# Patient Record
Sex: Female | Born: 1935 | Race: White | Hispanic: No | Smoking: Never smoker
Health system: Southern US, Community
[De-identification: ages and names within clinical notes are randomized; demographics above are authoritative.]

## PROBLEM LIST (undated history)

## (undated) DIAGNOSIS — J4489 Other specified chronic obstructive pulmonary disease: Secondary | ICD-10-CM

## (undated) DIAGNOSIS — I7300 Raynaud's syndrome without gangrene: Secondary | ICD-10-CM

## (undated) DIAGNOSIS — R4182 Altered mental status, unspecified: Secondary | ICD-10-CM

## (undated) DIAGNOSIS — D649 Anemia, unspecified: Secondary | ICD-10-CM

## (undated) DIAGNOSIS — M25559 Pain in unspecified hip: Secondary | ICD-10-CM

## (undated) DIAGNOSIS — M1990 Unspecified osteoarthritis, unspecified site: Secondary | ICD-10-CM

## (undated) DIAGNOSIS — W19XXXA Unspecified fall, initial encounter: Secondary | ICD-10-CM

## (undated) DIAGNOSIS — Z96649 Presence of unspecified artificial hip joint: Secondary | ICD-10-CM

## (undated) DIAGNOSIS — R739 Hyperglycemia, unspecified: Secondary | ICD-10-CM

## (undated) DIAGNOSIS — J438 Other emphysema: Secondary | ICD-10-CM

## (undated) DIAGNOSIS — I10 Essential (primary) hypertension: Secondary | ICD-10-CM

## (undated) DIAGNOSIS — K922 Gastrointestinal hemorrhage, unspecified: Secondary | ICD-10-CM

## (undated) DIAGNOSIS — M719 Bursopathy, unspecified: Secondary | ICD-10-CM

## (undated) DIAGNOSIS — R141 Gas pain: Secondary | ICD-10-CM

## (undated) DIAGNOSIS — K5900 Constipation, unspecified: Secondary | ICD-10-CM

## (undated) DIAGNOSIS — F0280 Dementia in other diseases classified elsewhere without behavioral disturbance: Secondary | ICD-10-CM

## (undated) DIAGNOSIS — I719 Aortic aneurysm of unspecified site, without rupture: Secondary | ICD-10-CM

## (undated) DIAGNOSIS — J189 Pneumonia, unspecified organism: Secondary | ICD-10-CM

## (undated) DIAGNOSIS — J449 Chronic obstructive pulmonary disease, unspecified: Secondary | ICD-10-CM

## (undated) DIAGNOSIS — I959 Hypotension, unspecified: Secondary | ICD-10-CM

## (undated) DIAGNOSIS — S52609A Unspecified fracture of lower end of unspecified ulna, initial encounter for closed fracture: Secondary | ICD-10-CM

## (undated) DIAGNOSIS — E559 Vitamin D deficiency, unspecified: Secondary | ICD-10-CM

## (undated) DIAGNOSIS — R0602 Shortness of breath: Secondary | ICD-10-CM

## (undated) DIAGNOSIS — R350 Frequency of micturition: Secondary | ICD-10-CM

## (undated) DIAGNOSIS — C189 Malignant neoplasm of colon, unspecified: Secondary | ICD-10-CM

## (undated) DIAGNOSIS — M2550 Pain in unspecified joint: Secondary | ICD-10-CM

## (undated) DIAGNOSIS — G309 Alzheimer's disease, unspecified: Secondary | ICD-10-CM

## (undated) DIAGNOSIS — L89609 Pressure ulcer of unspecified heel, unspecified stage: Secondary | ICD-10-CM

## (undated) DIAGNOSIS — R142 Eructation: Secondary | ICD-10-CM

## (undated) DIAGNOSIS — M67919 Unspecified disorder of synovium and tendon, unspecified shoulder: Secondary | ICD-10-CM

## (undated) DIAGNOSIS — R143 Flatulence: Secondary | ICD-10-CM

## (undated) DIAGNOSIS — K2270 Barrett's esophagus without dysplasia: Secondary | ICD-10-CM

## (undated) DIAGNOSIS — F411 Generalized anxiety disorder: Secondary | ICD-10-CM

## (undated) DIAGNOSIS — M810 Age-related osteoporosis without current pathological fracture: Secondary | ICD-10-CM

## (undated) HISTORY — DX: Hyperglycemia, unspecified: R73.9

## (undated) HISTORY — DX: Anemia, unspecified: D64.9

## (undated) HISTORY — DX: Pain in unspecified hip: M25.559

## (undated) HISTORY — DX: Presence of unspecified artificial hip joint: Z96.649

## (undated) HISTORY — DX: Altered mental status, unspecified: R41.82

## (undated) HISTORY — DX: Aortic aneurysm of unspecified site, without rupture: I71.9

## (undated) HISTORY — DX: Gastrointestinal hemorrhage, unspecified: K92.2

## (undated) HISTORY — DX: Constipation, unspecified: K59.00

## (undated) HISTORY — PX: DILATION AND CURETTAGE OF UTERUS: SHX78

## (undated) HISTORY — DX: Unspecified fracture of lower end of unspecified ulna, initial encounter for closed fracture: S52.609A

## (undated) HISTORY — DX: Vitamin D deficiency, unspecified: E55.9

## (undated) HISTORY — DX: Raynaud's syndrome without gangrene: I73.00

## (undated) HISTORY — DX: Other emphysema: J43.8

## (undated) HISTORY — DX: Bursopathy, unspecified: M71.9

## (undated) HISTORY — DX: Pneumonia, unspecified organism: J18.9

## (undated) HISTORY — DX: Pain in unspecified joint: M25.50

## (undated) HISTORY — DX: Unspecified fall, initial encounter: W19.XXXA

## (undated) HISTORY — DX: Unspecified disorder of synovium and tendon, unspecified shoulder: M67.919

## (undated) HISTORY — DX: Hypotension, unspecified: I95.9

---

## 1998-07-28 DIAGNOSIS — I7300 Raynaud's syndrome without gangrene: Secondary | ICD-10-CM

## 1998-07-28 HISTORY — DX: Raynaud's syndrome without gangrene: I73.00

## 1999-07-29 DIAGNOSIS — J438 Other emphysema: Secondary | ICD-10-CM

## 1999-07-29 HISTORY — DX: Other emphysema: J43.8

## 1999-12-29 HISTORY — PX: COLON SURGERY: SHX602

## 2002-12-28 HISTORY — PX: CATARACT EXTRACTION W/ INTRAOCULAR LENS  IMPLANT, BILATERAL: SHX1307

## 2005-07-28 DIAGNOSIS — M810 Age-related osteoporosis without current pathological fracture: Secondary | ICD-10-CM

## 2005-07-28 DIAGNOSIS — M1990 Unspecified osteoarthritis, unspecified site: Secondary | ICD-10-CM

## 2005-07-28 DIAGNOSIS — I719 Aortic aneurysm of unspecified site, without rupture: Secondary | ICD-10-CM

## 2005-07-28 HISTORY — DX: Aortic aneurysm of unspecified site, without rupture: I71.9

## 2005-07-28 HISTORY — DX: Age-related osteoporosis without current pathological fracture: M81.0

## 2005-07-28 HISTORY — DX: Unspecified osteoarthritis, unspecified site: M19.90

## 2005-10-14 ENCOUNTER — Ambulatory Visit (HOSPITAL_COMMUNITY): Admission: RE | Admit: 2005-10-14 | Discharge: 2005-10-14 | Payer: Self-pay | Admitting: Internal Medicine

## 2008-10-09 DIAGNOSIS — K5900 Constipation, unspecified: Secondary | ICD-10-CM

## 2008-10-09 HISTORY — DX: Constipation, unspecified: K59.00

## 2008-12-28 HISTORY — PX: TOTAL HIP ARTHROPLASTY: SHX124

## 2009-03-23 DIAGNOSIS — Z96649 Presence of unspecified artificial hip joint: Secondary | ICD-10-CM

## 2009-03-23 HISTORY — DX: Presence of unspecified artificial hip joint: Z96.649

## 2009-04-01 ENCOUNTER — Inpatient Hospital Stay (HOSPITAL_COMMUNITY): Admission: EM | Admit: 2009-04-01 | Discharge: 2009-04-05 | Payer: Self-pay | Admitting: Emergency Medicine

## 2009-04-01 ENCOUNTER — Ambulatory Visit: Payer: Self-pay | Admitting: *Deleted

## 2009-04-02 ENCOUNTER — Encounter (INDEPENDENT_AMBULATORY_CARE_PROVIDER_SITE_OTHER): Payer: Self-pay | Admitting: Specialist

## 2009-04-05 DIAGNOSIS — D649 Anemia, unspecified: Secondary | ICD-10-CM

## 2009-04-05 HISTORY — DX: Anemia, unspecified: D64.9

## 2009-04-12 DIAGNOSIS — M25559 Pain in unspecified hip: Secondary | ICD-10-CM

## 2009-04-12 HISTORY — DX: Pain in unspecified hip: M25.559

## 2009-04-13 ENCOUNTER — Inpatient Hospital Stay (HOSPITAL_COMMUNITY): Admission: EM | Admit: 2009-04-13 | Discharge: 2009-04-17 | Payer: Self-pay | Admitting: Emergency Medicine

## 2009-04-14 ENCOUNTER — Ambulatory Visit: Payer: Self-pay | Admitting: Vascular Surgery

## 2009-04-15 ENCOUNTER — Encounter (INDEPENDENT_AMBULATORY_CARE_PROVIDER_SITE_OTHER): Payer: Self-pay | Admitting: Internal Medicine

## 2009-06-21 DIAGNOSIS — M67919 Unspecified disorder of synovium and tendon, unspecified shoulder: Secondary | ICD-10-CM

## 2009-06-21 HISTORY — DX: Unspecified disorder of synovium and tendon, unspecified shoulder: M67.919

## 2010-04-11 DIAGNOSIS — R4182 Altered mental status, unspecified: Secondary | ICD-10-CM

## 2010-04-11 HISTORY — DX: Altered mental status, unspecified: R41.82

## 2010-05-07 DIAGNOSIS — E559 Vitamin D deficiency, unspecified: Secondary | ICD-10-CM

## 2010-05-07 HISTORY — DX: Vitamin D deficiency, unspecified: E55.9

## 2011-03-29 DIAGNOSIS — S52609A Unspecified fracture of lower end of unspecified ulna, initial encounter for closed fracture: Secondary | ICD-10-CM

## 2011-03-29 HISTORY — DX: Unspecified fracture of lower end of unspecified ulna, initial encounter for closed fracture: S52.609A

## 2011-04-08 LAB — URINALYSIS, ROUTINE W REFLEX MICROSCOPIC
Bilirubin Urine: NEGATIVE
Bilirubin Urine: NEGATIVE
Bilirubin Urine: NEGATIVE
Glucose, UA: NEGATIVE mg/dL
Glucose, UA: NEGATIVE mg/dL
Glucose, UA: NEGATIVE mg/dL
Hgb urine dipstick: NEGATIVE
Hgb urine dipstick: NEGATIVE
Ketones, ur: NEGATIVE mg/dL
Nitrite: NEGATIVE
Protein, ur: NEGATIVE mg/dL
Specific Gravity, Urine: 1.013 (ref 1.005–1.030)
Specific Gravity, Urine: 1.016 (ref 1.005–1.030)
pH: 6.5 (ref 5.0–8.0)
pH: 7 (ref 5.0–8.0)

## 2011-04-08 LAB — COMPREHENSIVE METABOLIC PANEL
ALT: 41 U/L — ABNORMAL HIGH (ref 0–35)
AST: 23 U/L (ref 0–37)
AST: 28 U/L (ref 0–37)
Albumin: 2.3 g/dL — ABNORMAL LOW (ref 3.5–5.2)
Albumin: 2.8 g/dL — ABNORMAL LOW (ref 3.5–5.2)
BUN: 24 mg/dL — ABNORMAL HIGH (ref 6–23)
CO2: 24 mEq/L (ref 19–32)
Calcium: 8.5 mg/dL (ref 8.4–10.5)
Calcium: 9.5 mg/dL (ref 8.4–10.5)
Calcium: 9.1 mg/dL (ref 8.4–10.5)
Chloride: 103 mEq/L (ref 96–112)
Chloride: 105 mEq/L (ref 96–112)
Creatinine, Ser: 0.58 mg/dL (ref 0.4–1.2)
Creatinine, Ser: 0.58 mg/dL (ref 0.4–1.2)
Creatinine, Ser: 0.86 mg/dL (ref 0.4–1.2)
GFR calc Af Amer: >60 mL/min (ref >60)
GFR calc Af Amer: >60 mL/min (ref >60)
GFR calc Af Amer: >60 mL/min (ref >60)
GFR calc non Af Amer: >60 mL/min (ref >60)
Sodium: 133 mEq/L — ABNORMAL LOW (ref 135–145)
Total Bilirubin: 0.6 mg/dL (ref 0.3–1.2)
Total Protein: 6.8 g/dL (ref 6.0–8.3)

## 2011-04-08 LAB — CULTURE, BLOOD (ROUTINE X 2): Culture: NO GROWTH

## 2011-04-08 LAB — CBC
HCT: 32.5 % — ABNORMAL LOW (ref 36.0–46.0)
HCT: 22.3 % — ABNORMAL LOW (ref 36.0–46.0)
HCT: 35.1 % — ABNORMAL LOW (ref 36.0–46.0)
Hemoglobin: 10.9 g/dL — ABNORMAL LOW (ref 12.0–15.0)
Hemoglobin: 11.1 g/dL — ABNORMAL LOW (ref 12.0–15.0)
Hemoglobin: 7.6 g/dL — CL (ref 12.0–15.0)
Hemoglobin: 9.1 g/dL — ABNORMAL LOW (ref 12.0–15.0)
MCHC: 33.8 g/dL (ref 30.0–36.0)
MCHC: 34 g/dL (ref 30.0–36.0)
MCHC: 34.1 g/dL (ref 30.0–36.0)
MCHC: 33.2 g/dL (ref 30.0–36.0)
MCHC: 34 g/dL (ref 30.0–36.0)
MCHC: 34.5 g/dL (ref 30.0–36.0)
MCV: 93.9 fL (ref 78.0–100.0)
MCV: 94.3 fL (ref 78.0–100.0)
MCV: 92.7 fL (ref 78.0–100.0)
MCV: 94.1 fL (ref 78.0–100.0)
Platelets: 464 10*3/uL — ABNORMAL HIGH (ref 150–400)
Platelets: 470 10*3/uL — ABNORMAL HIGH (ref 150–400)
Platelets: 580 10*3/uL — ABNORMAL HIGH (ref 150–400)
Platelets: 260 10*3/uL (ref 150–400)
RBC: 3.19 MIL/uL — ABNORMAL LOW (ref 3.87–5.11)
RBC: 3.49 MIL/uL — ABNORMAL LOW (ref 3.87–5.11)
RBC: 3.98 MIL/uL (ref 3.87–5.11)
RBC: 2.38 MIL/uL — ABNORMAL LOW (ref 3.87–5.11)
RBC: 2.82 MIL/uL — ABNORMAL LOW (ref 3.87–5.11)
RBC: 3.25 MIL/uL — ABNORMAL LOW (ref 3.87–5.11)
RDW: 14.2 % (ref 11.5–15.5)
RDW: 14.5 % (ref 11.5–15.5)
RDW: 14.9 % (ref 11.5–15.5)
RDW: 13.2 % (ref 11.5–15.5)
RDW: 13.5 % (ref 11.5–15.5)
WBC: 12.3 10*3/uL — ABNORMAL HIGH (ref 4.0–10.5)
WBC: 7.1 10*3/uL (ref 4.0–10.5)
WBC: 8.2 10*3/uL (ref 4.0–10.5)
WBC: 7.7 10*3/uL (ref 4.0–10.5)
WBC: 9.8 10*3/uL (ref 4.0–10.5)

## 2011-04-08 LAB — CROSSMATCH
ABO/RH(D): O POS
Antibody Screen: NEGATIVE
Antibody Screen: NEGATIVE

## 2011-04-08 LAB — BASIC METABOLIC PANEL
BUN: 13 mg/dL (ref 6–23)
BUN: 18 mg/dL (ref 6–23)
CO2: 26 mEq/L (ref 19–32)
CO2: 24 mEq/L (ref 19–32)
CO2: 24 mEq/L (ref 19–32)
CO2: 25 mEq/L (ref 19–32)
Calcium: 8.7 mg/dL (ref 8.4–10.5)
Calcium: 8.7 mg/dL (ref 8.4–10.5)
Calcium: 8.1 mg/dL — ABNORMAL LOW (ref 8.4–10.5)
Calcium: 8.7 mg/dL (ref 8.4–10.5)
Chloride: 103 mEq/L (ref 96–112)
Chloride: 106 mEq/L (ref 96–112)
Chloride: 108 mEq/L (ref 96–112)
Creatinine, Ser: 0.48 mg/dL (ref 0.4–1.2)
Creatinine, Ser: 0.48 mg/dL (ref 0.4–1.2)
Creatinine, Ser: 0.74 mg/dL (ref 0.4–1.2)
Creatinine, Ser: 0.76 mg/dL (ref 0.4–1.2)
GFR calc Af Amer: >60 mL/min (ref >60)
GFR calc Af Amer: >60 mL/min (ref >60)
GFR calc Af Amer: >60 mL/min (ref >60)
GFR calc Af Amer: >60 mL/min (ref >60)
GFR calc Af Amer: >60 mL/min (ref >60)
GFR calc Af Amer: >60 mL/min (ref >60)
GFR calc non Af Amer: >60 mL/min (ref >60)
GFR calc non Af Amer: >60 mL/min (ref >60)
GFR calc non Af Amer: >60 mL/min (ref >60)
GFR calc non Af Amer: >60 mL/min (ref >60)
Glucose, Bld: 106 mg/dL — ABNORMAL HIGH (ref 70–99)
Glucose, Bld: 133 mg/dL — ABNORMAL HIGH (ref 70–99)
Glucose, Bld: 129 mg/dL — ABNORMAL HIGH (ref 70–99)
Glucose, Bld: 141 mg/dL — ABNORMAL HIGH (ref 70–99)
Potassium: 3 mEq/L — ABNORMAL LOW (ref 3.5–5.1)
Potassium: 3.1 mEq/L — ABNORMAL LOW (ref 3.5–5.1)
Potassium: 3.8 mEq/L (ref 3.5–5.1)
Potassium: 3.7 mEq/L (ref 3.5–5.1)
Sodium: 133 mEq/L — ABNORMAL LOW (ref 135–145)
Sodium: 135 mEq/L (ref 135–145)
Sodium: 137 mEq/L (ref 135–145)
Sodium: 132 mEq/L — ABNORMAL LOW (ref 135–145)
Sodium: 134 mEq/L — ABNORMAL LOW (ref 135–145)

## 2011-04-08 LAB — DIFFERENTIAL
Basophils Absolute: 0 10*3/uL (ref 0.0–0.1)
Eosinophils Absolute: 0 10*3/uL (ref 0.0–0.7)
Eosinophils Relative: 0 % (ref 0–5)
Lymphocytes Relative: 9 % — ABNORMAL LOW (ref 12–46)
Lymphocytes Relative: 14 % (ref 12–46)
Lymphs Abs: 1.1 10*3/uL (ref 0.7–4.0)
Lymphs Abs: 1.4 10*3/uL (ref 0.7–4.0)
Monocytes Absolute: 0.6 10*3/uL (ref 0.1–1.0)
Monocytes Relative: 5 % (ref 3–12)
Neutro Abs: 8.1 10*3/uL — ABNORMAL HIGH (ref 1.7–7.7)

## 2011-04-08 LAB — PROTIME-INR
INR: 1.1 (ref 0.00–1.49)
INR: 1 (ref 0.00–1.49)
Prothrombin Time: 14.3 seconds (ref 11.6–15.2)
Prothrombin Time: 13.7 seconds (ref 11.6–15.2)

## 2011-04-08 LAB — URINE CULTURE: Special Requests: NEGATIVE

## 2011-04-08 LAB — APTT
aPTT: 37 seconds (ref 24–37)
aPTT: 30 seconds (ref 24–37)

## 2011-04-08 LAB — LIPASE, BLOOD: Lipase: 28 U/L (ref 11–59)

## 2011-04-08 LAB — CK TOTAL AND CKMB (NOT AT ARMC): Relative Index: INVALID (ref 0.0–2.5)

## 2011-04-08 LAB — URINE MICROSCOPIC-ADD ON

## 2011-04-08 LAB — IRON AND TIBC
Iron: 32 ug/dL — ABNORMAL LOW (ref 42–135)
TIBC: 202 ug/dL — ABNORMAL LOW (ref 250–470)

## 2011-04-08 LAB — GLUCOSE, CAPILLARY: Glucose-Capillary: 121 mg/dL — ABNORMAL HIGH (ref 70–99)

## 2011-04-08 LAB — SAMPLE TO BLOOD BANK

## 2011-04-08 LAB — RETICULOCYTES: Retic Count, Absolute: 100.4 10*3/uL (ref 19.0–186.0)

## 2011-05-12 NOTE — Op Note (Signed)
NAMEJUNELL, [name]                  ACCOUNT NO.:  [id number]   MEDICAL RECORD NO.:  [id number]          PATIENT TYPE:  INP   LOCATION:  1612                         FACILITY:  [facility]   PHYSICIAN:  Madlyn Frankel. Charlann Boxer, M.D.  DATE OF BIRTH:  [birth year]   DATE OF PROCEDURE:  04/02/2009  DATE OF DISCHARGE:                               OPERATIVE REPORT   PREOPERATIVE DIAGNOSIS:  Left peritrochanteric/subtrochanteric femur  fracture with displacement.   POSTOPERATIVE DIAGNOSIS:  Left peritrochanteric/subtrochanteric femur  fracture with displacement.   PROCEDURE:  Open reduction with internal fixation, left proximal femur  fracture utilizing DePuy trochanteric nail, 320 mm x 11 mm with  130  degree lag screw measuring 105 mm and a distal interlock.   SURGEON:  Madlyn Frankel. Charlann Boxer, M.D.   ASSISTANT:  Yetta Glassman. Mann, PA   ANESTHESIA:  General   ESTIMATED BLOOD LOSS:  Approximately 150 mL.   DRAINS:  None.   COMPLICATIONS:  None.   SPECIMEN:  None.   INDICATIONS FOR PROCEDURE:  [name] is a 77 year old female admitted  ti the hospital the day before after falling.  Radiographs revealed a  proximal femur fracture that had an intertrochanteric extension but  extended well below the subtrochanteric region.  There was anterior  angulation and displacement with posterior displacement of the shaft.   She was initially admitted to Dr. Paula Libra.  I was asked to consult  for definitive management.  The fracture pattern was reviewed with the  family.  The risks and benefits discussed about the procedure itself for  increased risks of perioperative mortality, morbidity due to her  dementia, etc.  Consent was obtained for the benefit of fracture  healing.   PROCEDURE IN DETAIL:  The patient was brought to operative theater.  Once adequate anesthesia, preoperative antibiotics, Ancef administered,  the patient was positioned supine on the fracture table.  The right  lower extremity was flexed  and abducted out of the way with bony  prominences padded to protect the peroneal nerve.   The left foot was placed in a traction shoe. At this point traction with  internal rotation was applied.  Fluoroscopy was brought into the field  and anterior-posterior dimension of the fracture was reduced into  anatomic position.  However, laterally there was still the posterior  displacement.  However, the length of  the fracture appeared to be  anatomic.  At this point the left lateral thigh from the iliac crest to  below the knee was prepped and draped using shower curtain augmented by  Ioban distally to allow for perfect circle technique.   Landmarks were identified.  I basically made an incision that started  below the fracture pattern distally, proximal to the trochanter, to  allow for entry of the nail.  I dissected down to the iliotibial band  and incised this.  I then elevated the vastus lateralis distally over  the fracture site, identifying the fracture.  I was able to reduce the  fracture into anatomic position.  I passed the bone hook around the  fracture site and  passed a 16 gauge wire which I cinched down, tightened  to reduce the fracture into anatomic position and maintain reduction and  internal fixation.   Once this was done and confirmed radiographically in both the AP and  lateral dimension, I attended to the nail end in the hip.  I chose to  use a long trochanteric nail for fixation as well as passage of the  subtrochanteric portion of the nail.   A guidewire was then further inserted through the tip of the trochanter.  I then drilled to open the proximal femur and then passed a ball-tip  guidewire.  I started with a 9 mm reamer and reamed up to a 12.5 mm  reamer.  I then measured the depth and chose a 320 mm nail.  I passed  this left trochanter entry nail by hand to its correct location.  There  was no difficulty in passage.  Once it was oriented into its correct   plane I passed a guidewire into the center of the head in the AP and  lateral planes.  I chose a 105-mm lag screw, drilled and tapped this  area and then passed this lag screw without difficulty.  Once this was  done I removed the jig proximally and into a perfect surgical technique  and distal screw.  Fluoroscopy was brought to a lateral position and  covered appropriately.  I then used a drill and 44 mm distal interlock.  Final radiographs were obtained in the AP and lateral planes confirming  reduction maintenance.  The 16 gauge wire was tightened down, cut and  bent into its appropriate position, with no soft tissue impingement.  We  had irrigated the wounds throughout the case and again at this point.  I  then reapproximated the iliotibial band over the vastus lateralis using  #1 Vicryl.  The remainder of the wounds were closed with 2-0 Vicryl and  staples on the skin including the distal wound.  The patient was then  brought to recovery in stable condition.  She tolerated to the procedure  well.  Madlyn Frankel. Charlann Boxer, M.D.  Electronically Signed     MDO/MEDQ  D:  04/02/2009  T:  04/03/2009  Job:  [id number]

## 2011-05-12 NOTE — Discharge Summary (Signed)
NAMEANDREANA, KLINGERMAN                  ACCOUNT NO.:  [id number]   MEDICAL RECORD NO.:  [id number]          PATIENT TYPE:  INP   LOCATION:  1304                         FACILITY:  [facility]   PHYSICIAN:  Eduard Clos, MDDATE OF BIRTH:  18-Apr-1917   DATE OF ADMISSION:  04/12/2009  DATE OF DISCHARGE:                               DISCHARGE SUMMARY   PRIMARY CARE PHYSICIAN:  Dr. Murray Hodgkins in [facility].   COURSE IN THE HOSPITAL:  A 77 year old female who had a recent ORIF,  history of Alzheimer's dementia, hypertension, hypothyroidism, was  brought into the ER after the patient was found to have intractable  nausea and vomiting.  The patient was also found to be initially  hypotensive.  The patient was started on IV fluids, placed initially  n.p.o.  Acute abdominal series did not show any acute findings, and CAT  scan was also done, which did not show acute findings, except some  thickening of the distal esophageus.  The patient has a known history of  Barrett's esophagus.  Followup barium swallow did not show any mass or  any strictures.  The patient also had concerning a Doppler of the lower  extremities done, which shows negative for any DVT.  Followup was done  as the patient was complaining of some pain.  There was no defect found,  except for the healing fracture and ORIF.  I did review x-ray findings  with radiologist.   As the patient's nausea and vomiting subsided, the patient was able to  tolerate diet.  The patient was transferred to medical floor and at this  time the patient is able to tolerate 100% of her diet.  The patient will  be transferred back as soon as tomorrow morning stable.  At the time of  this dictation, the patient is hemodynamically stable.  Also discussed  with the patient's son, the patient will need followup with her GI  doctor for further workup on her Barrett's esophagus.   PROCEDURES DONE DURING THE STAY:  1. Abdominal series on April 06, 2009 showed no acute cardiopulmonary      process.  Gas in the colon without evidence of obstruction.  2. CT abdomen and pelvis with contrast on April 12, 2009 showed no      acute findings, distal esophageal wall thickening suspicious for      esophagitis or mass. Consider nonemergent endoscopy for further      evaluation.  No acute findings.  3. X-ray of the pelvis on April 14, 2009, hardware appears      appropriately placed.  Fracture fragments having realigned.  4. X-ray of the hip, left side, April 14, 2009, showed interval      placement of left femoral long-stem gamma nail with distal aspect      not visualized.  Improved alignment compared to preoperative exam.      Small step off in the proximal left femoral shaft, also seen with      original fracture.  5. Barium swallow on April 15, 2009 showed a limited exam.  No  evidence of high-grade stricture or obstructing mass.   PERTINENT LABS:  The patient had a hemoglobin of 11.1, hematocrit 32.5  at discharge.  Creatinine was 0.4.  Lactic acid was 0.9 and lipase was  28.  LFTs were normal.   FINAL DIAGNOSES:  1. Intractable nausea and vomiting.  2. Hypotension from dehydration.  3. Dehydration.  4. Dementia.  5. Hypertension.  6. Recent left-sided open reduction internal fixation.   MEDICATIONS AT DISCHARGE:  1. Lovenox 40 mg subcutaneous daily for 6 more days.  2. Enteric-coated aspirin 325 mg daily for 4 weeks followed by 81 mg      p.o. to start from May 04, 2009.  3. Iron 325 mg 2 times daily.  4. Centrum Silver 1 tablet p.o. daily.  5. Altace 2.5 mg daily.  6. Vitamin C 500 mg daily.  7. Namenda 10 mg p.o. twice daily.  8. Aricept 10 mg p.o. at bedtime.  9. Robaxin 5 mg p.o. q.6 hours p.r.n. pain.  10.Colace 100 mg p.o. twice daily as needed.  11.Miralax 17 g p.o. daily as needed for constipation.  12.Tylenol 650 mg p.o. q.6 p.r.n. back pain.  13.Claritin 10 mg p.o. daily.  14.Phazyme 180 mg 3 times daily as  needed.   PLAN:  The patient is returned to skilled nursing facility tomorrow if  stable.  To follow with her primary care physician, Dr. Chilton Si within a  week's time.  Recheck BMET and CBC.  The patient will need further  workup on Barrett's esophagus as scheduled per PCP through GI doctor.  The patient is to have cardiac healthy diet.  Activity as tolerated with  fall precautions.      Eduard Clos, MD  Electronically Signed     ANK/MEDQ  D:  04/16/2009  T:  04/16/2009  Job:  [id number]   cc:   Lenon Curt. Chilton Si, M.D.  Fax: [phone]

## 2011-05-12 NOTE — H&P (Signed)
[name], [name]                  ACCOUNT NO.:  [id number]   MEDICAL RECORD NO.:  [id number]          PATIENT TYPE:  EMS   LOCATION:  ED                           FACILITY:  [facility]   PHYSICIAN:  Jene Every, M.D.    DATE OF BIRTH:  September 30, 1917   DATE OF ADMISSION:  04/01/2009  DATE OF DISCHARGE:                              HISTORY & PHYSICAL   CHIEF COMPLAINT:  Left hip pain.   HISTORY:  This is a 77 year old female with a history of Alzheimer's who  apparently had slipped on a wet floor today onto her left hip.  She has  had a generalized rapid decline in her overall condition with a history  of Alzheimer's according to her son.  No loss of consciousness, blurred  vision.   She denies chest pain, shortness of breath, fevers, or chills.   PAST MEDICAL HISTORY:  Otherwise significant including COPD,  hypertension, Alzheimer's, anxiety, aortic aneurysm, aortic valve  regurgitation, colon CA, emphysema, pneumonia, Raynaud's disease,  degenerative joint disease, Barrett's esophagus.   FAMILY HISTORY:  Noncontributory.   SOCIAL HISTORY:  Lives at the [facility].   PRIMARY CARE Diedra Sinor:  Dr. Neva Seat.   MEDICATIONS:  Actonel, aspirin, centrum, Altace, vitamins, Amitiza,  claritin, Phazyme, Tylenol.   EXAM:  CONSTITUTIONAL:  Elderly.  Mild distress.  Mood and affect are  anxious.  The patient inappropriate responses.  HEENT:  Within normal limits.  COR:  Regular rate and rhythm.  PULMONARY:  Clear to auscultation.  ABDOMEN:  Soft and nontender.  PELVIS:  Stable.  EXTREMITIES:  Left lower extremity shortened and externally rotated.  1+  dorsalis pedis and posterior tibial pulses.  Dorsiflexion and plantar  flexion.  Compartments were soft and nontender to palpation.  No joint  line tenderness is noted.  Pain with attempted rotation of her hip.  This is on the left.  Right is negative.   Radiographs demonstrate an intertrochanteric/subtrochanteric fracture,  osteoarthritis  of the hip is noted.   LABS:  UA negative.  Sodium 135, potassium 4.4, creatinine 0.86, BUN 26.  LFTs are within normal limits.  Slightly low albumin at 3.1.  White  count is 9.8, hematocrit 35.1, and hemoglobin is 11.6.  White count is  9.8.  Platelet count is 260,000.   EKG is normal sinus rhythm.  Chest x-ray with no active disease.   IMPRESSION:  Acute peritrochanteric left hip fracture with 2 multiple  comorbidities including aortic aneurysm, aortic valve regurgitation,  Alzheimer's, hypertension, chronic obstructive pulmonary disease.   PLAN:  1. Will require intramedullary nailing of the left hip.  We discussed      risks and benefits with the family and her including bleeding,      infection, DVT, PE, malunion, nonunion, need for revision, et      Karie Soda before undergoing hip replacement.  2. Preoperative clearance.  Seen by Dr. Patty Sermons as on call.      Additional information concerning aortic aneurysm.  3. Will proceed with IM nailing.      Jene Every, M.D.  Electronically Signed  JB/MEDQ  D:  04/01/2009  T:  04/01/2009  Job:  [id number]

## 2011-05-12 NOTE — Consult Note (Signed)
[name], [name]                  ACCOUNT NO.:  [id number]   MEDICAL RECORD NO.:  [id number]          PATIENT TYPE:  INP   LOCATION:  0105                         FACILITY:  [facility]   PHYSICIAN:  Audery Amel, MD    DATE OF BIRTH:  1917-05-10   DATE OF CONSULTATION:  DATE OF DISCHARGE:                                 CONSULTATION   REASON FOR CONSULTATION:  Preoperative evaluation.   HISTORY OF PRESENT ILLNESS:  [name] is a 77 year old white female  with a past medical history notable for reduced systolic function, mild  aortic insufficiency, and Alzheimer's disease who presents to the  emergency department after falling earlier this evening.  The patient is  significantly demented and unable to supply history.  However, she was  accompanied by her family members.  They state that the patient  slipped and fell in her bathroom earlier today after stepping on some  water.  Her family members deny that the patient experienced any chest  pain, shortness of breath, dyspnea on exertion, palpitations, presyncope  or syncope.  Based on the details of the presentation, it would seem  that the patient experienced a mechanical fall.  They do state that the  patient's gait has become somewhat unstable over the last 6-7 months.  Previously, the patient was very physically active and engaged in water  aerobics.  However, she has not at been able to do this in the last 6-7  months.  They do state that the patient is able to ambulate on flat  surfaces without any functional limitations other than her gait  instability.  The patient has no personal history of coronary artery  disease nor is there a history of coronary artery disease in her family.  The patient has not been experiencing any symptoms which are concerning  for angina or an acute coronary syndrome.  Her only complaint currently  is that of left hip pain.   PAST MEDICAL HISTORY:  1. Reduced systolic function with estimated EF of  40-45% by      echocardiogram in April 2008.  2. Mild aortic insufficiency.  3. Hypertension.  4. Alzheimer's disease.  5. Osteoarthritis.  6. Questionable history of abdominal aortic aneurysm.  7. History of hypothyroidism.  8. History of suspected Raynaud's disease.  9. Status post partial colon resection in 2001 for cancer.  10.Barrett's esophagus status post multiple esophageal dilatations.   SOCIAL HISTORY:  To place the patient currently lives in a skilled  nursing facility.  There is no history of alcohol, tobacco or illicit  substances.   FAMILY HISTORY:  In speaking with her family members, there is no  general history of coronary artery disease in her family, and all but  one of her brothers and sisters are currently alive and well.   REVIEW OF SYSTEMS:  As per HPI as supplied by her family members.  Unable to directly elicit a review of systems from the patient due to  her dementia.   PHYSICAL EXAMINATION:  VITAL SIGNS:  Blood pressure is 147/82, heart  rate is 55,  O2 sats are 96% on 2 liters nasal cannula.  GENERAL:  The patient is a very elderly, frail-appearing white female in  no acute distress.  She is alert and awake and oriented to her person  but not place and time.  NECK:  Supple, full range of motion, and mild JVD.  There is no palpable  thyromegaly, or lymphadenopathy.  Carotid upstrokes are equal and  symmetric bilaterally.  No audible bruits.  CHEST:  Clear to auscultation bilaterally anteriorly.  CARDIOVASCULAR:  Normal S1-S2 with no audible murmurs, rubs or gallops.  PMI is nonpalpable.  ABDOMEN:  Obese, soft, nontender and positive bowel sounds and no past  splenomegaly.  EXTREMITIES:  Reveal no clubbing, cyanosis or edema.  She has 2+ distal  pulses symmetric bilaterally.  NEUROLOGIC:  The patient does have significant dementia, but her exam  appears nonfocal.   DATA REVIEWED:  EKG by my interpretation reveals sinus bradycardia with  first-degree  AV block and nonspecific ST changes which are likely  artifact due to patient movement.   CBC:  White count 9.8, hematocrit 35.1, platelet count 260.  Sodium 135,  potassium 4.4, chloride 105, CO2 is 24, BUN is 24, creatinine 0.86,  glucose 125, T bili 0.6, AST is 23, ALT 16, alk-phos 72, calcium 9.1,  INR 1.0.   IMPRESSION:  1. Left hip intertrochanteric fracture.  2. Preoperative evaluation and risk ratification  3. Reduced systolic heart function, estimated EF 40 45%.  4. Mild aortic insufficiency.  5. Hypertension.  6. Alzheimer's dementia.   RECOMMENDATIONS:  Based on the history of presentation provided by Ms.  Ramirez's family members, it would appear that the patient has sustained a  mechanical fall.  It does not appear that there was a cardiovascular  etiology.  She denies any symptoms which are concerning for angina, and  there is no evidence to suggest an acute coronary syndrome or an  arrhythmic etiology.  The patient has no prior history of coronary  artery disease.  She does, however, have a history of reduced systolic  function with mild aortic insufficiency by echocardiogram in 2008.  At  this point, I would recommend checking a 2-D transthoracic  echocardiogram to assess her left ventricular function and structure.  Given that the patient is currently not receiving a beta blocker  chronically, I do not advocate initiating beta blockade acutely prior to  proceeding to the OR.  It certainly would be reasonable to continue her  81 mg aspirin.  However, in the absence of coronary artery disease, this  is likely not necessary as well.  Based on history, the patient would  seem to have a reasonable functional capacity, and there are no signs or  symptoms to warrant an ischemia evaluation.  Pending the outcome of her  echocardiogram, she may proceed to the OR without further evaluation.  We appreciate the opportunity to participate in the care of Ms.  Ramirez,  and we will  follow along with you throughout the rest of her hospital  course.  Please feel free to contact us if there are any additional  questions.      Audery Amel, MD  Electronically Signed     SHG/MEDQ  D:  04/01/2009  T:  04/02/2009  Job:  [id number]

## 2011-05-12 NOTE — Discharge Summary (Signed)
Jamie, Ramirez                  ACCOUNT NO.:  [id number]   MEDICAL RECORD NO.:  [id number]          PATIENT TYPE:  INP   LOCATION:  1612                         FACILITY:  [facility]   PHYSICIAN:  Madlyn Frankel. Charlann Boxer, M.D.  DATE OF BIRTH:  Aug 16, 1917   DATE OF ADMISSION:  04/01/2009  DATE OF DISCHARGE:                               DISCHARGE SUMMARY   ADMITTING DIAGNOSES:  1. Osteoporosis.  2. Chronic obstructive pulmonary disease.  3. Hypertension.  4. Alzheimer's.  5. Anxiety.  6. Aortic aneurysm.  7. Aortic valve regurgitation.  8. Colon cancer.  9. Emphysema.  10.Raynaud's disease.  11.Degenerative joint disease.  12.Barrett's esophagus.   DISCHARGE DIAGNOSES:  1. Osteoporosis.  2. Chronic obstructive pulmonary disease.  3. Hypertension.  4. Alzheimer's.  5. Anxiety.  6. Aortic aneurysm.  7. Aortic valve regurgitation.  8. Colon cancer.  9. Emphysema.  10.Raynaud's disease.  11.Degenerative joint disease.  12.Barrett's esophagus.  13.Acute blood loss anemia.   HISTORY OF PRESENT ILLNESS:  A 77 year old female with a history of  Alzheimer's slipped on a wet floor onto her left hip, had immediate  pain, inability to bear weight and was brought to the emergency  department and x-rays revealed a left hip fracture.  She was admitted  for definitive surgical management.   CONSULTS:  Cardiology for medical clearance.   PROCEDURE:  Open reduction, internal fixation of a left subtrochanteric  femur fracture by surgeon, Dr. Durene Romans and assistant, Dwyane Luo PA-  C.   LABORATORY DATA:  CBC - white blood cells 6.8, hemoglobin 7.6,  hematocrit 22.3, platelets 182.  She was transfused 2 units prior to  discharge.  Metabolic panel; sodium 132, potassium 3.7, BUN 9,  creatinine 0.64, glucose 129.   RADIOLOGY:  Left femur two-view showed satisfactory appearance, status  post ORIF of left hip fracture.   Cardiology:  EKG on April 01, 2009, showed sinus tachycardia, T-wave  abnormality, otherwise normal ECG.   HOSPITAL COURSE:  The patient admitted through the emergency department  to the orthopedic service.  She was cleared by cardiology for surgery.  She underwent surgery on April 02, 2009, by Dr. Charlann Boxer.  She had family  members present throughout the course of stay as well as sitters.  She  remained afebrile.  Hemodynamically, she did have acute blood loss  anemia and was transfused a couple units.  Otherwise, her electrolytes  remained stable.  Her dressing was changed.  No significant drainage  from the wound.  She remained neurovascularly intact of her left lower  extremity.  She was partial weightbearing status.  She is high fall  risk.  By April 05, 2009, she was stable, afebrile.  Dressing was  changed.  She was neurovascularly intact of her left lower extremity and  ready for discharge to skilled nurse facility rehab.   DISCHARGE DISPOSITION:  Discharged to skilled nurse facility rehab in  stable and improved condition.   DISCHARGE PHYSICAL THERAPY:  She is partial weightbearing 50% with use  of rolling walker.  She is high fall risk.   DISCHARGE DIET:  Heart-healthy.   DISCHARGE WOUND CARE:  Keep dry.   DISCHARGE MEDICATIONS:  1. Lovenox 40 mg subcu q.24 x10 days.  2. Start enteric-coated aspirin 325 mg 1 p.o. daily x4 weeks.  3. Start aspirin 81 mg p.o. daily after completion of the full-      strength aspirin.  4. Robaxin 500 mg 1 p.o. q.6 p.r.n. muscle spasm or pain.  5. Iron 325 mg 1 p.o. t.i.d. x2 weeks.  6. Colace 100 mg 1 p.o. b.i.d. p.r.n.  7. MiraLax 17 grams p.o. daily p.r.n.  8. Norco 5/325 one p.o. q.6 p.r.n. moderate to severe pain.  9. Tylenol 650 mg 1 p.o. q.6 p.r.n. mild pain.  10.Actonel, hold for 4 weeks.  11.Multivitamin daily.  12.Altace 2.5 mg p.o. daily.  13.Vitamin C 500 mg p.o. daily.  14.__________ daily.  15.__________10 mg p.o. b.i.d.  16.Aricept 10 mg p.o. nightly.  17.Claritin 10 mg p.r.n.  18.Phazyme  180 mg three times a day as needed.   DISCHARGE FOLLOWUP:  Follow with Dr. Charlann Boxer at phone number [phone] in 2  weeks for staple removal.     ______________________________  [name]. [name]      Madlyn Frankel. Charlann Boxer, M.D.  Electronically Signed    BLM/MEDQ  D:  04/05/2009  T:  04/05/2009  Job:  [id number]   cc:   Chilton Si, MD   Audery Amel, MD  [address]  [place], [place] [address]

## 2011-05-12 NOTE — H&P (Signed)
[name], [name]                  ACCOUNT NO.:  [id number]   MEDICAL RECORD NO.:  [id number]          PATIENT TYPE:  INP   LOCATION:  1241                         FACILITY:  [facility]   PHYSICIAN:  Della Goo, M.D. DATE OF BIRTH:  [birth year]   DATE OF ADMISSION:  04/13/2009  DATE OF DISCHARGE:                              HISTORY & PHYSICAL   PRIMARY CARE PHYSICIAN:  [facility] Dr. Frederik Pear   CHIEF COMPLAINT:  Nausea, vomiting.   HISTORY OF PRESENT ILLNESS:  This is a 77 year old female who is a  [facility] who was brought for  evaluation secondary to continued persistent intractable nausea and  vomiting for the past one and a half days.  Her family is at bedside and  assists with giving the history.  The patient has a history of  Alzheimer's and cannot give the history.  She was reported as having  emesis that started out with food like substances and liquids and then  became more bilious in appearance.  The patient has also had increased  confusion over the past few days as well.  There was no report of the  patient having any fevers, chills or diarrhea.  Patient had a recent  open reduction internal fixation of the left femur secondary to fracture  which was performed April 02, 2009, by Dr. Durene Romans, Orthopedics, and  the patient was hospitalized at that time.  Of note, the patient's  family reports that the patient has a severe intolerance to pain  medications/opiates, she became severely confused after taking these  medications..  There has been no report of the patient having any  fevers, chills, chest pain or shortness of breath, syncope or seizures.   PAST MEDICAL HISTORY:  1. Alzheimer's dementia.  2. Hypertension.  3. Hypothyroidism.  4. Renal disease.  5. Barrett's esophagus, status post multiple esophageal dilatations.  6. Mild aortic insufficiency.  7. Abdominal aortic aneurysm.  8. Partial colonic  resection secondary to precancerous lesions of the      colon.  9. Osteoarthritis/degenerative joint disease.   MEDICATIONS:  1. Actonel 35 mg one p.o. q. Week.  2. Centrum Silver one p.o. daily.  3. Altace 2.5 mg one p.o. daily.  4. Vitamin C 500 mg one p.o. daily.  5. Viactiv one chewed daily.  6. Namenda 10 mg p.o. b.i.d.  7. Aricept 10 mg p.o. q.h.s.  8. Colace 100 mg p.o. b.i.d.  9. Robaxin 500 mg one p.o. q.6 h p.r.n. muscle spasms.  10.Claritin 10 mg one p.o. daily p.r.n.  11.Phazyme 180 mg t.i.d. p.r.n.  12.MiraLax 17 grams p.o. in 8 ounces liquid daily p.r.n.  13.Lovenox 40 mg subcu q. day for 10 days which started on April 9,  14.Aspirin 81 mg one p.o. daily.  15.Iron 325 mg one p.o. t.i.d. for 2 weeks which was started on April      9, as well.  16.The patient was started on Rocephin 1 gram intramuscularly for 7      days on April 12, 2009.  ALLERGIES:  Oxytrol.  The reaction is unknown.  Also, the patient has an  intolerance to narcotic pain medications.   SOCIAL HISTORY:  The patient is a widow.  [facility].  She has been there for 3 years.  She is a  nonsmoker, nondrinker.   FAMILY HISTORY:  Unable to obtain.   REVIEW OF SYSTEMS:  Pertinents are mentioned above.   PHYSICAL EXAMINATION FINDINGS:  GENERAL:  This is a 77 year old elderly,  frail, well-developed female who is mildly confused and in mild  distress.  VITAL SIGNS:  Initially were temperature 97.8, blood pressure now  115/63, heart rate 103, respirations 18, O2 sats 98% on room air.  HEENT:  Examination normocephalic, atraumatic.  Pupils equally round  reactive to light.  Extraocular movements are intact.  Funduscopic  benign.  No scleral icterus.  Nares are patent bilaterally.  Oropharynx  is clear.  NECK:  Supple full range of motion.  No thyromegaly, adenopathy, jugular  venous distention.  CARDIOVASCULAR:  Regular rate and rhythm.  No murmurs,  gallops or rubs.  LUNGS:  Clear to auscultation.  ABDOMEN:  Decreased bowel sounds, soft, nontender, mildly distended.  There is no hepatosplenomegaly.  No rebound or guarding.  EXTREMITIES:  Without cyanosis, clubbing or edema.  There is a surgical  dressing along the left lateral hip and thigh area which is clean and  dry and intact.  NEUROLOGIC:  The patient has generalized weakness and is able to move  all four of her extremities and is mildly confused.   LABORATORY STUDIES:  White blood cell count 12.3, hemoglobin 12.7,  hematocrit 37.5, platelets 580, neutrophils 86%, lymphocytes 9%.  Sodium  133, potassium 4.1, chloride 97, bicarb 28, BUN 19, creatinine 0.58 and  glucose 151, lipase 28.  Urinalysis negative.  An acute abdominal series  was performed revealing no acute disease process in the chest, and also  the abdomen films revealed a nonobstructive gas pattern.  CT scan of the  abdomen performed revealing no acute findings.  There is distal  esophageal wall thickening which is noted and this is suspicious for  esophagitis or a possible mass.   ASSESSMENT:  A 77 year old female being admitted with:  1. Intractable nausea and vomiting.  2. Ileus, possibly secondary to pain medication therapy.  3. Abdominal distention secondary to #1 and #2.  4. Leukocytosis.   PLAN:  The patient will be admitted to a step-down ICU area.  Antiemetics have been ordered along with IV fluids for fluid  resuscitation.  She will be n.p.o. for now.  Blood cultures x2 have been  ordered secondary to the patient's leukocytosis and overall clinical  picture.  Empiric antibiotic therapy has also been started with  vancomycin and  Zosyn due to the patient's recent hospitalization and surgery.  The  patient will be placed on DVT and GI prophylaxis.  Further workup will  ensue pending results of the patient's clinical course and narcotic pain  medications will be held and the patient will be given pain  medications  which are nonsteroidal if she can tolerate this therapy.      Della Goo, M.D.  Electronically Signed     HJ/MEDQ  D:  04/13/2009  T:  04/14/2009  Job:  [id number]   cc:   Lenon Curt. Chilton Si, M.D.  Fax: [phone]

## 2012-04-15 DIAGNOSIS — M2550 Pain in unspecified joint: Secondary | ICD-10-CM

## 2012-04-15 HISTORY — DX: Pain in unspecified joint: M25.50

## 2012-04-18 LAB — TSH: TSH: 1.24 u[IU]/mL (ref <=5.90)

## 2012-06-12 ENCOUNTER — Emergency Department (HOSPITAL_COMMUNITY)
Admission: EM | Admit: 2012-06-12 | Discharge: 2012-06-13 | Disposition: A | Discharge status: Home / Self Care | Payer: Medicare Other | Attending: Emergency Medicine | Admitting: Emergency Medicine

## 2012-06-12 ENCOUNTER — Encounter (HOSPITAL_COMMUNITY): Payer: Self-pay | Admitting: Emergency Medicine

## 2012-06-12 DIAGNOSIS — F0280 Dementia in other diseases classified elsewhere without behavioral disturbance: Secondary | ICD-10-CM | POA: Insufficient documentation

## 2012-06-12 DIAGNOSIS — I10 Essential (primary) hypertension: Secondary | ICD-10-CM | POA: Insufficient documentation

## 2012-06-12 DIAGNOSIS — R51 Headache: Secondary | ICD-10-CM | POA: Insufficient documentation

## 2012-06-12 DIAGNOSIS — IMO0002 Reserved for concepts with insufficient information to code with codable children: Secondary | ICD-10-CM | POA: Insufficient documentation

## 2012-06-12 DIAGNOSIS — G309 Alzheimer's disease, unspecified: Secondary | ICD-10-CM | POA: Insufficient documentation

## 2012-06-12 DIAGNOSIS — S0081XA Abrasion of other part of head, initial encounter: Secondary | ICD-10-CM

## 2012-06-12 DIAGNOSIS — I6789 Other cerebrovascular disease: Secondary | ICD-10-CM | POA: Insufficient documentation

## 2012-06-12 DIAGNOSIS — W06XXXA Fall from bed, initial encounter: Secondary | ICD-10-CM | POA: Insufficient documentation

## 2012-06-12 DIAGNOSIS — Y9210 Unspecified residential institution as the place of occurrence of the external cause: Secondary | ICD-10-CM | POA: Insufficient documentation

## 2012-06-12 HISTORY — DX: Pressure ulcer of unspecified heel, unspecified stage: L89.609

## 2012-06-12 HISTORY — DX: Shortness of breath: R06.02

## 2012-06-12 HISTORY — DX: Gas pain: R14.1

## 2012-06-12 HISTORY — DX: Age-related osteoporosis without current pathological fracture: M81.0

## 2012-06-12 HISTORY — DX: Dementia in other diseases classified elsewhere, unspecified severity, without behavioral disturbance, psychotic disturbance, mood disturbance, and anxiety: F02.80

## 2012-06-12 HISTORY — DX: Chronic obstructive pulmonary disease, unspecified: J44.9

## 2012-06-12 HISTORY — DX: Generalized anxiety disorder: F41.1

## 2012-06-12 HISTORY — DX: Unspecified osteoarthritis, unspecified site: M19.90

## 2012-06-12 HISTORY — DX: Eructation: R14.2

## 2012-06-12 HISTORY — DX: Frequency of micturition: R35.0

## 2012-06-12 HISTORY — DX: Barrett's esophagus without dysplasia: K22.70

## 2012-06-12 HISTORY — DX: Flatulence: R14.3

## 2012-06-12 HISTORY — DX: Malignant neoplasm of colon, unspecified: C18.9

## 2012-06-12 HISTORY — DX: Alzheimer's disease, unspecified: G30.9

## 2012-06-12 HISTORY — DX: Other specified chronic obstructive pulmonary disease: J44.89

## 2012-06-12 HISTORY — DX: Essential (primary) hypertension: I10

## 2012-06-12 NOTE — ED Notes (Signed)
WGN:FA21<HY> Expected date:06/12/12<BR> Expected time:<BR> Means of arrival:<BR> Comments:<BR> EMS 50 GC - fall

## 2012-06-12 NOTE — ED Notes (Signed)
Pt alert to baseline, from local ECF, pt fell out of bed, resp even unlabored, skin pwd, pt has redness to nose, pt hx dementia

## 2012-06-13 ENCOUNTER — Emergency Department (HOSPITAL_COMMUNITY): Payer: Medicare Other

## 2012-06-13 DIAGNOSIS — W19XXXA Unspecified fall, initial encounter: Secondary | ICD-10-CM

## 2012-06-13 HISTORY — DX: Unspecified fall, initial encounter: W19.XXXA

## 2012-06-13 NOTE — ED Provider Notes (Signed)
History     CSN: [id number]  Arrival date & time 06/12/12  2220   First MD Initiated Contact with Patient 06/13/12 0008      Chief Complaint  Patient presents with  . Fall    (Consider location/radiation/quality/duration/timing/severity/associated sxs/prior treatment) HPI Comments: Level 5 caveat applies because of dementia - Patient is 78 year old with a history of dementia who presents tonight after having fallen out of bed at the nursing facility.  Here with no complaints but with noted abrasion to the bridge of her nose, is moving all extremities and no noted LOC per nursing home notes.  Patient is at her baseline mental status wise.  Patient is a 78 y.o. female presenting with fall. The history is provided by the patient. History Limited By: dementia. No language interpreter was used.  Fall The accident occurred 3 to 5 hours ago. The fall occurred while walking. She fell from a height of 3 to 5 ft. She landed on carpet. There was no blood loss. Point of impact: face. Pain location: nose. The pain is at a severity of 0/10. The patient is experiencing no pain. She was not ambulatory at the scene. There was no entrapment after the fall. There was no drug use involved in the accident. There was no alcohol use involved in the accident. Pertinent negatives include no visual change, no fever, no numbness, no abdominal pain, no bowel incontinence, no nausea, no vomiting, no hematuria, no headaches, no hearing loss, no loss of consciousness and no tingling.    Past Medical History  Diagnosis Date  . Malignant neoplasm of colon, unspecified site   . Anxiety state, unspecified   . Alzheimer's disease   . Unspecified essential hypertension   . Chronic airway obstruction, not elsewhere classified   . Barrett's esophagus   . Osteoarthrosis, unspecified whether generalized or localized, unspecified site   . Pain in joint, shoulder region   . Osteoporosis, unspecified   . Shortness of breath     . Flatulence, eructation, and gas pain   . Urinary frequency   . Other abnormal blood chemistry   . Pressure ulcer, heel     Past Surgical History  Procedure Date  . Dilation and curettage of uterus   . Colon surgery   . Eye surgery   . Joint replacement     No family history on file.  History  Substance Use Topics  . Smoking status: Never Smoker   . Smokeless tobacco: Not on file  . Alcohol Use: No    OB History    Grav Para Term Preterm Abortions TAB SAB Ect Mult Living                  Review of Systems  Unable to perform ROS: Dementia  Constitutional: Negative for fever.  Gastrointestinal: Negative for nausea, vomiting, abdominal pain and bowel incontinence.  Genitourinary: Negative for hematuria.  Neurological: Negative for tingling, loss of consciousness, numbness and headaches.    Allergies  Morphine and related and Oxytrol  Home Medications   Current Outpatient Rx  Name Route Sig Dispense Refill  . ACETAMINOPHEN 325 MG PO TABS Oral Take 650 mg by mouth 2 (two) times daily.    Marland Kitchen CALCIUM-VITAMIN D-VITAMIN K 500-500-40 MG-UNT-MCG PO CHEW Oral Chew 1 tablet by mouth daily.    Marland Kitchen CITALOPRAM HYDROBROMIDE 10 MG PO TABS Oral Take 10 mg by mouth daily.    . ERGOCALCIFEROL 50000 UNITS PO CAPS Oral Take 50,000 Units by  mouth once a week.    Marland Kitchen HYDROXYZINE HCL 10 MG PO TABS Oral Take 10 mg by mouth 2 (two) times daily as needed. Itching    . LISINOPRIL 10 MG PO TABS Oral Take 10 mg by mouth daily.    Marland Kitchen LORAZEPAM 0.5 MG PO TABS Oral Take 0.5 mg by mouth See admin instructions. TAke daily in the morning and twice as needed for anxiety throughout the day    . POLYETHYLENE GLYCOL 3350 PO PACK Oral Take 17 g by mouth daily.      BP 113/92  Pulse 69  Temp 97.5 F (36.4 C) (Oral)  Resp 18  SpO2 100%  Physical Exam  Nursing note and vitals reviewed. Constitutional: She appears well-developed and well-nourished. No distress.  HENT:  Head: Normocephalic.  Right  Ear: External ear normal.  Left Ear: External ear normal.  Nose: Nose normal.  Mouth/Throat: Oropharynx is clear and moist. No oropharyngeal exudate.       Abrasion to bridge of nose  Eyes: Conjunctivae are normal. Pupils are equal, round, and reactive to light. No scleral icterus.  Neck: Normal range of motion. Neck supple. No spinous process tenderness and no muscular tenderness present.  Cardiovascular: Normal rate, regular rhythm and normal heart sounds.  Exam reveals no gallop and no friction rub.   No murmur heard. Pulmonary/Chest: Effort normal and breath sounds normal. No respiratory distress. She has no wheezes. She has no rales. She exhibits no tenderness.  Abdominal: Soft. Bowel sounds are normal. She exhibits no distension.  Musculoskeletal: Normal range of motion. She exhibits no edema and no tenderness.  Lymphadenopathy:    She has no cervical adenopathy.  Neurological: She is alert. No cranial nerve deficit.  Skin: Skin is warm and dry. No rash noted. No erythema. No pallor.  Psychiatric: She has a normal mood and affect.    ED Course  Procedures (including critical care time)  Labs Reviewed - No data to display Ct Head Wo Contrast  06/13/2012  *RADIOLOGY REPORT*  Clinical Data:  Status post fall out of bed, with erythema about the nose.  Concern for head injury.  CT HEAD WITHOUT CONTRAST CT MAXILLOFACIAL WITHOUT CONTRAST  Technique:  Multidetector CT imaging of the head and maxillofacial structures were performed using the standard protocol without intravenous contrast. Multiplanar CT image reconstructions of the maxillofacial structures were also generated.  Comparison:   None.  CT HEAD  Findings: There is no evidence of acute infarction, mass lesion, or intra- or extra-axial hemorrhage on CT.  Prominence of the ventricles and sulci reflects moderately severe cortical volume loss.  Extensive diffuse periventricular and subcortical white matter change reflects small vessel  ischemic microangiopathy.  Cerebellar atrophy is noted.  The brainstem and fourth ventricle are within normal limits.  The basal ganglia are unremarkable in appearance.  The cerebral hemispheres demonstrate grossly normal gray-white differentiation. No mass effect or midline shift is seen.  There is no evidence of fracture; visualized osseous structures are unremarkable in appearance.  The visualized portions of the orbits are within normal limits.  The paranasal sinuses and mastoid air cells are well-aerated.  No significant soft tissue abnormalities are seen.  IMPRESSION:  1.  No evidence of traumatic intracranial injury or fracture. 2.  Moderately severe cortical volume loss and diffuse small vessel ischemic microangiopathy.  CT MAXILLOFACIAL  Findings:   Evaluation is suboptimal due to motion artifact.  There is no evidence of fracture or dislocation.  The maxilla and mandible appear  intact.  The nasal bone is unremarkable in appearance.  The visualized dentition demonstrates no acute abnormality.  The orbits are intact bilaterally.  The paranasal sinuses and visualized mastoid air cells are well-aerated.  No significant soft tissue abnormalities are seen.  The parapharyngeal fat planes are preserved.  The nasopharynx, oropharynx and hypopharynx are unremarkable in appearance.  The visualized portions of the valleculae and piriform sinuses are grossly unremarkable.  The parotid and submandibular glands are within normal limits.  No cervical lymphadenopathy is seen.  IMPRESSION: No evidence of fracture or dislocation.  Evaluation suboptimal due to motion artifact.  Original Report Authenticated By: Tonia Ghent, M.D.   Ct Maxillofacial Wo Cm  06/13/2012  *RADIOLOGY REPORT*  Clinical Data:  Status post fall out of bed, with erythema about the nose.  Concern for head injury.  CT HEAD WITHOUT CONTRAST CT MAXILLOFACIAL WITHOUT CONTRAST  Technique:  Multidetector CT imaging of the head and maxillofacial structures  were performed using the standard protocol without intravenous contrast. Multiplanar CT image reconstructions of the maxillofacial structures were also generated.  Comparison:   None.  CT HEAD  Findings: There is no evidence of acute infarction, mass lesion, or intra- or extra-axial hemorrhage on CT.  Prominence of the ventricles and sulci reflects moderately severe cortical volume loss.  Extensive diffuse periventricular and subcortical white matter change reflects small vessel ischemic microangiopathy.  Cerebellar atrophy is noted.  The brainstem and fourth ventricle are within normal limits.  The basal ganglia are unremarkable in appearance.  The cerebral hemispheres demonstrate grossly normal gray-white differentiation. No mass effect or midline shift is seen.  There is no evidence of fracture; visualized osseous structures are unremarkable in appearance.  The visualized portions of the orbits are within normal limits.  The paranasal sinuses and mastoid air cells are well-aerated.  No significant soft tissue abnormalities are seen.  IMPRESSION:  1.  No evidence of traumatic intracranial injury or fracture. 2.  Moderately severe cortical volume loss and diffuse small vessel ischemic microangiopathy.  CT MAXILLOFACIAL  Findings:   Evaluation is suboptimal due to motion artifact.  There is no evidence of fracture or dislocation.  The maxilla and mandible appear intact.  The nasal bone is unremarkable in appearance.  The visualized dentition demonstrates no acute abnormality.  The orbits are intact bilaterally.  The paranasal sinuses and visualized mastoid air cells are well-aerated.  No significant soft tissue abnormalities are seen.  The parapharyngeal fat planes are preserved.  The nasopharynx, oropharynx and hypopharynx are unremarkable in appearance.  The visualized portions of the valleculae and piriform sinuses are grossly unremarkable.  The parotid and submandibular glands are within normal limits.  No  cervical lymphadenopathy is seen.  IMPRESSION: No evidence of fracture or dislocation.  Evaluation suboptimal due to motion artifact.  Original Report Authenticated By: Tonia Ghent, M.D.     Abrasion to nose   MDM  Patient here with fall from bed, abrasion to nose - no evidence of ICH or facial fracture.  Will discharge back to nursing home.        Izola Price [place], [place] 06/13/12 [phone]

## 2012-06-13 NOTE — ED Notes (Signed)
[facility] contacted for transport 

## 2012-06-13 NOTE — Discharge Instructions (Signed)
Abrasions Abrasions are skin scrapes. Their treatment depends on how large and deep the abrasion is. Abrasions do not extend through all layers of the skin. A cut or lesion through all skin layers is called a laceration. HOME CARE INSTRUCTIONS   If you were given a dressing, change it at least once a day or as instructed by your caregiver. If the bandage sticks, soak it off with a solution of water or hydrogen peroxide.   Twice a day, wash the area with soap and water to remove all the cream/ointment. You may do this in a sink, under a tub faucet, or in a shower. Rinse off the soap and pat dry with a clean towel. Look for signs of infection (see below).   Reapply cream/ointment according to your caregiver's instruction. This will help prevent infection and keep the bandage from sticking. Telfa or gauze over the wound and under the dressing or wrap will also help keep the bandage from sticking.   If the bandage becomes wet, dirty, or develops a foul smell, change it as soon as possible.   Only take over-the-counter or prescription medicines for pain, discomfort, or fever as directed by your caregiver.  SEEK IMMEDIATE MEDICAL CARE IF:   Increasing pain in the wound.   Signs of infection develop: redness, swelling, surrounding area is tender to touch, or pus coming from the wound.   You have a fever.   Any foul smell coming from the wound or dressing.  Most skin wounds heal within ten days. Facial wounds heal faster. However, an infection may occur despite proper treatment. You should have the wound checked for signs of infection within 24 to 48 hours or sooner if problems arise. If you were not given a wound-check appointment, look closely at the wound yourself on the second day for early signs of infection listed above. MAKE SURE YOU:   Understand these instructions.   Will watch your condition.   Will get help right away if you are not doing well or get worse.  Document Released:  09/23/2005 Document Revised: 12/03/2011 Document Reviewed: 11/17/2011 [employer] Patient Information 2012 [employer]. 

## 2012-06-14 NOTE — ED Provider Notes (Signed)
Medical screening examination/treatment/procedure(s) were conducted as a shared visit with non-physician practitioner(s) and myself.  I personally evaluated the patient during the encounter.  78 yo demented female with fall, negative w/u to be d/c home  Olivia Mackie, MD 06/14/12 1027

## 2013-02-21 DIAGNOSIS — I959 Hypotension, unspecified: Secondary | ICD-10-CM

## 2013-02-21 DIAGNOSIS — K922 Gastrointestinal hemorrhage, unspecified: Secondary | ICD-10-CM

## 2013-02-21 HISTORY — DX: Gastrointestinal hemorrhage, unspecified: K92.2

## 2013-02-21 HISTORY — DX: Hypotension, unspecified: I95.9

## 2013-02-21 LAB — BASIC METABOLIC PANEL
BUN: 23 mg/dL — AB (ref 4–21)
Creatinine: 0.8 mg/dL (ref 0.5–1.1)
Glucose: 165 mg/dL
Potassium: 4.2 mmol/L (ref 3.4–5.3)
Sodium: 135 mmol/L — AB (ref 137–147)

## 2013-02-21 LAB — HEPATIC FUNCTION PANEL: AST: 15 U/L (ref 13–35)

## 2013-02-21 LAB — CBC AND DIFFERENTIAL
HCT: 41 % (ref 36–46)
Platelets: 308 10*3/uL (ref 150–399)
WBC: 14 10^3/mL

## 2013-02-24 ENCOUNTER — Encounter: Payer: Self-pay | Admitting: Nurse Practitioner

## 2013-02-24 DIAGNOSIS — J189 Pneumonia, unspecified organism: Secondary | ICD-10-CM

## 2013-02-24 HISTORY — DX: Pneumonia, unspecified organism: J18.9

## 2013-03-23 ENCOUNTER — Non-Acute Institutional Stay (SKILLED_NURSING_FACILITY): Payer: Medicare Other | Admitting: Internal Medicine

## 2013-03-23 DIAGNOSIS — G309 Alzheimer's disease, unspecified: Secondary | ICD-10-CM

## 2013-03-23 DIAGNOSIS — M1990 Unspecified osteoarthritis, unspecified site: Secondary | ICD-10-CM

## 2013-03-23 DIAGNOSIS — C189 Malignant neoplasm of colon, unspecified: Secondary | ICD-10-CM

## 2013-03-23 DIAGNOSIS — R7309 Other abnormal glucose: Secondary | ICD-10-CM

## 2013-03-23 DIAGNOSIS — R739 Hyperglycemia, unspecified: Secondary | ICD-10-CM

## 2013-03-23 DIAGNOSIS — F0280 Dementia in other diseases classified elsewhere without behavioral disturbance: Secondary | ICD-10-CM

## 2013-03-23 DIAGNOSIS — K2270 Barrett's esophagus without dysplasia: Secondary | ICD-10-CM

## 2013-03-23 DIAGNOSIS — I10 Essential (primary) hypertension: Secondary | ICD-10-CM

## 2013-03-24 ENCOUNTER — Non-Acute Institutional Stay (SKILLED_NURSING_FACILITY): Payer: Medicare Other | Admitting: Nurse Practitioner

## 2013-03-24 DIAGNOSIS — K5900 Constipation, unspecified: Secondary | ICD-10-CM

## 2013-03-24 DIAGNOSIS — I10 Essential (primary) hypertension: Secondary | ICD-10-CM

## 2013-03-24 DIAGNOSIS — F0390 Unspecified dementia without behavioral disturbance: Secondary | ICD-10-CM

## 2013-03-24 DIAGNOSIS — F411 Generalized anxiety disorder: Secondary | ICD-10-CM | POA: Insufficient documentation

## 2013-03-24 NOTE — Progress Notes (Signed)
  Subjective:    Patient ID: [name], female    DOB: 1917/05/04, 79 y.o.   MRN: [id number]  HPI              300.00-ANXIETY  331.0-ALZHEIMER'S  progressing, off Namenda--no longer beneficial.  401.9-HTN UNSPECIFIED  stable on Lisinopril 10mg  564.00-CONSTIPATION The symptoms are stable.on MiraLax.     Review of Systems  Constitutional: Negative.   HENT: Negative.   Eyes: Negative.   Respiratory: Negative.   Cardiovascular: Negative.   Gastrointestinal: Positive for constipation. Negative for abdominal pain and abdominal distention.  Endocrine: Negative.   Genitourinary: Positive for frequency (incontinence). Negative for dysuria, urgency, flank pain and pelvic pain.  Musculoskeletal: Negative.   Skin: Negative.  Negative for rash and wound.  Allergic/Immunologic: Negative.   Neurological: Negative.   Hematological: Negative.   Psychiatric/Behavioral: Positive for behavioral problems (end stage of dementia), confusion, decreased concentration and agitation (sometimes when personal care is provied. ).       Objective:   Physical Exam  Nursing note and vitals reviewed. Constitutional: She appears well-developed and well-nourished. No distress.  HENT:  Head: Normocephalic and atraumatic.  Eyes: Conjunctivae and EOM are normal. Pupils are equal, round, and reactive to light.  The right conjunctivitis resolved after 10 day course of Patanol  Neck: Normal range of motion. Neck supple. No JVD present. No thyromegaly present.  Cardiovascular: Normal rate, regular rhythm and normal heart sounds.   No murmur heard. Pulmonary/Chest: Effort normal and breath sounds normal. No respiratory distress. She has no wheezes. She has no rales.  Abdominal: Soft. Bowel sounds are normal. There is no tenderness.  Musculoskeletal: Normal range of motion. She exhibits no edema and no tenderness.  Lymphadenopathy:    She has no cervical adenopathy.  Neurological: She is alert. She displays normal  reflexes. No cranial nerve deficit. She exhibits normal muscle tone. Coordination normal.  Skin: Skin is warm and dry. No rash noted.  Psychiatric:  End stage of dementia and total care          Assessment & Plan:  Anxiety state, unspecified Continue Celexa 5mg  and Ativan qam  . Dementia End stage and total care  . Essential hypertension, benign Controlled on Lisinopril.   Marland Kitchen Unspecified constipation MeraLax is adequate.

## 2013-04-03 ENCOUNTER — Encounter: Payer: Self-pay | Admitting: Nurse Practitioner

## 2013-04-03 ENCOUNTER — Non-Acute Institutional Stay (SKILLED_NURSING_FACILITY): Payer: Medicare Other | Admitting: Nurse Practitioner

## 2013-04-03 DIAGNOSIS — F0390 Unspecified dementia without behavioral disturbance: Secondary | ICD-10-CM

## 2013-04-03 DIAGNOSIS — K5900 Constipation, unspecified: Secondary | ICD-10-CM

## 2013-04-03 DIAGNOSIS — I10 Essential (primary) hypertension: Secondary | ICD-10-CM

## 2013-04-03 DIAGNOSIS — F411 Generalized anxiety disorder: Secondary | ICD-10-CM

## 2013-04-03 NOTE — Assessment & Plan Note (Addendum)
Continue supportive care in SNF   

## 2013-04-03 NOTE — Progress Notes (Signed)
Patient ID: [name], female   DOB: [birth year], 79 y.o.   MRN: [id number]  Chief Complaint:  Chief Complaint  Patient presents with  . Medical Managment of Chronic Issues     HPI:   No new c/o today ANXIETY  stable on Celexa 5mg  and Lorazepam 0.5mg  qam ALZHEIMER'S  progressing, off Namenda--no longer beneficial. SNF for care needs HTN UNSPECIFIED  Controlled on Lisinopril 10mg  CONSTIPATION The symptoms are stable.on MiraLax.    Review of Systems:  Review of Systems  Constitutional: Negative for fever and weight loss.  HENT: Negative for hearing loss, congestion and neck pain.   Eyes: Negative.   Respiratory: Negative for cough, sputum production and wheezing.   Cardiovascular: Negative for chest pain, orthopnea and leg swelling.  Gastrointestinal: Negative for nausea, vomiting, abdominal pain, diarrhea and constipation.  Genitourinary: Positive for frequency (incontinent of bladder). Negative for urgency and flank pain.  Musculoskeletal: Positive for falls (if not supervised). Negative for back pain and joint pain.  Skin: Negative for itching and rash.  Neurological: Negative for tremors, speech change, focal weakness, seizures, loss of consciousness and weakness.  Endo/Heme/Allergies: Negative for polydipsia. Does not bruise/bleed easily.  Psychiatric/Behavioral: Positive for memory loss. Negative for depression and hallucinations. The patient is nervous/anxious (occasionally). The patient does not have insomnia.      Medications: Patient's Medications  New Prescriptions   No medications on file  Previous Medications   ACETAMINOPHEN (TYLENOL) 325 MG TABLET    Take 650 mg by mouth 2 (two) times daily.   CALCIUM-VITAMIN D-VITAMIN K (VIACTIV) 500-500-40 MG-UNT-MCG CHEW    Chew 1 tablet by mouth daily.   CITALOPRAM (CELEXA) 10 MG TABLET    Take 5 mg by mouth daily.    ERGOCALCIFEROL (VITAMIN D2) 50000 UNITS CAPSULE    Take 50,000 Units by mouth once a week.   LISINOPRIL  (PRINIVIL,ZESTRIL) 10 MG TABLET    Take 10 mg by mouth daily.   LORAZEPAM (ATIVAN) 0.5 MG TABLET    Take 0.5 mg by mouth See admin instructions. TAke daily in the morning and twice as needed for anxiety throughout the day   OMEPRAZOLE (PRILOSEC) 20 MG CAPSULE    Take 20 mg by mouth daily.   POLYETHYLENE GLYCOL (MIRALAX / GLYCOLAX) PACKET    Take 17 g by mouth daily.   SUCRALFATE (CARAFATE) 1 GM/10ML SUSPENSION    Take 1 g by mouth 4 (four) times daily.  Modified Medications   No medications on file  Discontinued Medications   HYDROXYZINE (ATARAX/VISTARIL) 10 MG TABLET    Take 10 mg by mouth 2 (two) times daily as needed. Itching     Physical Exam: Physical Exam  Constitutional: She appears well-developed and well-nourished.  HENT:  Head: Normocephalic and atraumatic.  Eyes: Conjunctivae and EOM are normal.  Neck: Normal range of motion. Neck supple. No JVD present. No thyromegaly present.  Cardiovascular: Normal rate, regular rhythm and normal heart sounds.   No murmur heard. Pulmonary/Chest: Effort normal. She has no wheezes. She has rales (bibasilar dry rales appreciated).  Abdominal: Soft. Bowel sounds are normal. There is no tenderness.  Musculoskeletal: Normal range of motion. She exhibits no edema and no tenderness.  Neurological: She is alert. No cranial nerve deficit. She exhibits normal muscle tone. Coordination normal.  Skin: Skin is warm and dry. No rash noted.  Psychiatric: Her mood appears anxious. Her affect is labile. Her affect is not angry. Her speech is tangential. Her speech is not slurred. She is  agitated (when assisted with peronal care) and combative (when she doesn't understad what you want her to do). She is not aggressive, not hyperactive and not withdrawn. Thought content is not paranoid and not delusional. Cognition and memory are impaired. She expresses impulsivity and inappropriate judgment. She does not exhibit a depressed mood. She exhibits abnormal recent  memory and abnormal remote memory.    Filed Vitals:   04/03/13 0930  BP: 128/82  Pulse: 78  Temp: 97 F (36.1 C)  TempSrc: Tympanic  Resp: 18      Labs reviewed: Basic Metabolic Panel: 02/21/13 Na 135, K 4.2, glucose 165, Bun 23, creatinine 0.83  Liver Function Tests:  2//25/14 LFT CBC:  02/21/13 wbc 14.0, Hgb 14.2, Hct 40.7, plt 308  Significant Diagnostic Results:   02/21/13 CXR minimal pulmonary vascualr congestion appears new. Patchy bibasilar atelectasis or pneumonitis slightly improve  Assessment/Plan Dementia Continue supportive care in SNF  Anxiety state, unspecified Continue with Ativan prn for anxiety  Essential hypertension, benign Controlled and continue with Lisinopril  Unspecified constipation stable      Family/ staff Communication: fall risk--intensive supervision needed.    Goals of care:  SNF for care.    Labs/tests ordered no

## 2013-04-03 NOTE — Assessment & Plan Note (Signed)
Controlled and continue with Lisinopril

## 2013-04-03 NOTE — Assessment & Plan Note (Signed)
Continue with Ativan prn for anxiety   

## 2013-04-03 NOTE — Assessment & Plan Note (Signed)
stable °

## 2013-05-03 ENCOUNTER — Non-Acute Institutional Stay (SKILLED_NURSING_FACILITY): Payer: Medicare Other | Admitting: Nurse Practitioner

## 2013-05-03 DIAGNOSIS — K219 Gastro-esophageal reflux disease without esophagitis: Secondary | ICD-10-CM | POA: Insufficient documentation

## 2013-05-03 DIAGNOSIS — F0390 Unspecified dementia without behavioral disturbance: Secondary | ICD-10-CM

## 2013-05-03 DIAGNOSIS — F411 Generalized anxiety disorder: Secondary | ICD-10-CM

## 2013-05-03 DIAGNOSIS — K5900 Constipation, unspecified: Secondary | ICD-10-CM

## 2013-05-03 DIAGNOSIS — I10 Essential (primary) hypertension: Secondary | ICD-10-CM

## 2013-05-03 NOTE — Assessment & Plan Note (Signed)
Continue with Ativan prn for anxiety

## 2013-05-03 NOTE — Assessment & Plan Note (Signed)
Controlled and continue with Lisinopril 10mg  

## 2013-05-03 NOTE — Assessment & Plan Note (Addendum)
Stable on Omeprazole and Carafate 10mg  ac and hs.

## 2013-05-03 NOTE — Progress Notes (Signed)
Patient ID: [name], female   DOB: 17-Apr-1917, 79 y.o.   MRN: [id number]  Chief Complaint:  Chief Complaint  Patient presents with  . Medical Managment of Chronic Issues     HPI:   Problem List Items Addressed This Visit     ICD-9-CM   Anxiety state, unspecified - Primary     Continue with Ativan prn for anxiety      Dementia     Continue supportive care in SNF      Essential hypertension, benign     Controlled and continue with Lisinopril 10mg       Unspecified constipation     Takes Tylenol 650mg  bid.     GERD (gastroesophageal reflux disease)     Stable on Omeprazole and Carafate 10mg  ac and hs.        Review of Systems:  Review of Systems  Constitutional: Negative for fever and weight loss.  HENT: Negative for hearing loss, congestion and neck pain.   Eyes: Negative.   Respiratory: Negative for cough, sputum production and wheezing.   Cardiovascular: Negative for chest pain, orthopnea and leg swelling.  Gastrointestinal: Negative for nausea, vomiting, abdominal pain, diarrhea and constipation.  Genitourinary: Positive for frequency (incontinent of bladder). Negative for urgency and flank pain.  Musculoskeletal: Positive for falls (if not supervised). Negative for back pain and joint pain.  Skin: Negative for itching and rash.  Neurological: Negative for tremors, speech change, focal weakness, seizures, loss of consciousness and weakness.  Endo/Heme/Allergies: Negative for polydipsia. Does not bruise/bleed easily.  Psychiatric/Behavioral: Positive for memory loss. Negative for depression and hallucinations. The patient is nervous/anxious (occasionally). The patient does not have insomnia.      Medications: Patient's Medications  New Prescriptions   No medications on file  Previous Medications   ACETAMINOPHEN (TYLENOL) 325 MG TABLET    Take 650 mg by mouth 2 (two) times daily.   CALCIUM-VITAMIN D-VITAMIN K (VIACTIV) 500-500-40 MG-UNT-MCG CHEW    Chew 1  tablet by mouth daily.   CITALOPRAM (CELEXA) 10 MG TABLET    Take 5 mg by mouth daily.    ERGOCALCIFEROL (VITAMIN D2) 50000 UNITS CAPSULE    Take 50,000 Units by mouth once a week.   LISINOPRIL (PRINIVIL,ZESTRIL) 10 MG TABLET    Take 10 mg by mouth daily.   LORAZEPAM (ATIVAN) 0.5 MG TABLET    Take 0.5 mg by mouth See admin instructions. TAke daily in the morning and twice as needed for anxiety throughout the day   OMEPRAZOLE (PRILOSEC) 20 MG CAPSULE    Take 20 mg by mouth daily.   POLYETHYLENE GLYCOL (MIRALAX / GLYCOLAX) PACKET    Take 17 g by mouth daily.   SUCRALFATE (CARAFATE) 1 GM/10ML SUSPENSION    Take 1 g by mouth 4 (four) times daily.  Modified Medications   No medications on file  Discontinued Medications   No medications on file     Physical Exam: Physical Exam  Constitutional: She appears well-developed and well-nourished.  HENT:  Head: Normocephalic and atraumatic.  Eyes: Conjunctivae and EOM are normal.  Neck: Normal range of motion. Neck supple. No JVD present. No thyromegaly present.  Cardiovascular: Normal rate, regular rhythm and normal heart sounds.   No murmur heard. Pulmonary/Chest: Effort normal. She has no wheezes. She has rales (bibasilar dry rales appreciated).  Abdominal: Soft. Bowel sounds are normal. There is no tenderness.  Musculoskeletal: Normal range of motion. She exhibits no edema and no tenderness.  Neurological: She is alert.  No cranial nerve deficit. She exhibits normal muscle tone. Coordination normal.  Skin: Skin is warm and dry. No rash noted.  Psychiatric: Her mood appears anxious. Her affect is labile. Her affect is not angry. Her speech is tangential. Her speech is not slurred. She is agitated (when assisted with peronal care) and combative (when she doesn't understad what you want her to do). She is not aggressive, not hyperactive and not withdrawn. Thought content is not paranoid and not delusional. Cognition and memory are impaired. She  expresses impulsivity and inappropriate judgment. She does not exhibit a depressed mood. She exhibits abnormal recent memory and abnormal remote memory.     Filed Vitals:   05/03/13 1644  BP: 120/68  Pulse: 74  Temp: 96.8 F (36 C)  TempSrc: Tympanic  Resp: 18      Labs reviewed: Basic Metabolic Panel: No results found for this basename: NA, K, CL, CO2, GLUCOSE, BUN, CREATININE, CALCIUM, MG, PHOS, TSH,  in the last 8760 hours  Liver Function Tests: No results found for this basename: AST, ALT, ALKPHOS, BILITOT, PROT, ALBUMIN,  in the last 8760 hours  CBC: No results found for this basename: WBC, NEUTROABS, HGB, HCT, MCV, PLT,  in the last 8760 hours  Anemia Panel: No results found for this basename: IRON, FOLATE, VITAMINB12,  in the last 8760 hours  Significant Diagnostic Results:     Assessment/Plan Anxiety state, unspecified Continue with Ativan prn for anxiety    Dementia Continue supportive care in SNF    Essential hypertension, benign Controlled and continue with Lisinopril 10mg     Unspecified constipation Takes Tylenol 650mg  bid.   GERD (gastroesophageal reflux disease) Stable on Omeprazole and Carafate 10mg  ac and hs.      Family/ staff Communication: safety   Goals of care: SNF   Labs/tests ordered none

## 2013-05-03 NOTE — Assessment & Plan Note (Signed)
Continue supportive care in SNF

## 2013-05-03 NOTE — Assessment & Plan Note (Signed)
Takes Tylenol 650mg  bid.

## 2013-06-05 ENCOUNTER — Encounter: Payer: Self-pay | Admitting: *Deleted

## 2013-06-28 ENCOUNTER — Non-Acute Institutional Stay (SKILLED_NURSING_FACILITY): Payer: Medicare Other | Admitting: Nurse Practitioner

## 2013-06-28 ENCOUNTER — Encounter: Payer: Self-pay | Admitting: Nurse Practitioner

## 2013-06-28 DIAGNOSIS — K5900 Constipation, unspecified: Secondary | ICD-10-CM

## 2013-06-28 DIAGNOSIS — K219 Gastro-esophageal reflux disease without esophagitis: Secondary | ICD-10-CM

## 2013-06-28 DIAGNOSIS — I10 Essential (primary) hypertension: Secondary | ICD-10-CM

## 2013-06-28 DIAGNOSIS — F411 Generalized anxiety disorder: Secondary | ICD-10-CM

## 2013-06-28 NOTE — Assessment & Plan Note (Signed)
Takes Tylenol 650mg bid.  

## 2013-06-28 NOTE — Assessment & Plan Note (Signed)
Continue with Celexa 5mg  and Ativan 0.25mg  qam and bid prn for anxiety

## 2013-06-28 NOTE — Assessment & Plan Note (Signed)
Stable on Omeprazole and Carafate 10mg  ac and hs.

## 2013-06-28 NOTE — Assessment & Plan Note (Signed)
Continue supportive care in SNF

## 2013-06-28 NOTE — Progress Notes (Signed)
Patient ID: [name], female   DOB: 1917-02-22, 79 y.o.   MRN: [id number]  Chief Complaint:  Chief Complaint  Patient presents with  . Medical Managment of Chronic Issues     HPI:   Problem List Items Addressed This Visit   Anxiety state, unspecified - Primary     Continue with Celexa 5mg  and Ativan 0.25mg  qam and bid prn for anxiety        Essential hypertension, benign     Controlled and continue with Lisinopril 10mg         GERD (gastroesophageal reflux disease)     Stable on Omeprazole and Carafate 10mg  ac and hs.       Unspecified constipation     Takes Tylenol 650mg  bid.          Review of Systems:  Review of Systems  Constitutional: Negative for fever and weight loss.  HENT: Negative for hearing loss, congestion and neck pain.   Eyes: Negative.   Respiratory: Negative for cough, sputum production and wheezing.   Cardiovascular: Negative for chest pain, orthopnea and leg swelling.  Gastrointestinal: Negative for nausea, vomiting, abdominal pain, diarrhea and constipation.  Genitourinary: Positive for frequency (incontinent of bladder). Negative for urgency and flank pain.  Musculoskeletal: Positive for falls (if not supervised). Negative for back pain and joint pain.  Skin: Negative for itching and rash.  Neurological: Negative for tremors, speech change, focal weakness, seizures, loss of consciousness and weakness.  Endo/Heme/Allergies: Negative for polydipsia. Does not bruise/bleed easily.  Psychiatric/Behavioral: Positive for memory loss. Negative for depression and hallucinations. The patient is nervous/anxious (occasionally). The patient does not have insomnia.      Medications: Patient's Medications  New Prescriptions   No medications on file  Previous Medications   ACETAMINOPHEN (TYLENOL) 325 MG TABLET    Take 650 mg by mouth 2 (two) times daily. Take 2 tablets every 6 hours as needed .   CALCIUM-VITAMIN D-VITAMIN K (VIACTIV) 500-500-40  MG-UNT-MCG CHEW    Chew 1 tablet by mouth daily.   CITALOPRAM (CELEXA) 10 MG TABLET    Take 10 mg by mouth daily. Take 2 daily to help nerves and depression.   ERGOCALCIFEROL (VITAMIN D2) 50000 UNITS CAPSULE    Take 50,000 Units by mouth once a week.   LISINOPRIL (PRINIVIL,ZESTRIL) 10 MG TABLET    Take 10 mg by mouth daily.   LORAZEPAM (ATIVAN) 0.5 MG TABLET    Take 0.5 mg by mouth See admin instructions. TAke daily in the morning and twice as needed for anxiety throughout the day   MEMANTINE (NAMENDA) 10 MG TABLET    Take 10 mg by mouth 2 (two) times daily. Take 1 tablet daily for memory.   OMEPRAZOLE (PRILOSEC) 20 MG CAPSULE    Take 20 mg by mouth daily.   POLYETHYLENE GLYCOL (MIRALAX / GLYCOLAX) PACKET    Take 17 g by mouth daily.   SUCRALFATE (CARAFATE) 1 GM/10ML SUSPENSION    Take 1 g by mouth 4 (four) times daily.  Modified Medications   No medications on file  Discontinued Medications   No medications on file     Physical Exam: Physical Exam  Constitutional: She appears well-developed and well-nourished.  HENT:  Head: Normocephalic and atraumatic.  Eyes: Conjunctivae and EOM are normal.  Neck: Normal range of motion. Neck supple. No JVD present. No thyromegaly present.  Cardiovascular: Normal rate, regular rhythm and normal heart sounds.   No murmur heard. Pulmonary/Chest: Effort normal. She has no wheezes.  She has rales (bibasilar dry rales appreciated).  Abdominal: Soft. Bowel sounds are normal. There is no tenderness.  Musculoskeletal: Normal range of motion. She exhibits no edema and no tenderness.  Neurological: She is alert. No cranial nerve deficit. She exhibits normal muscle tone. Coordination normal.  Skin: Skin is warm and dry. No rash noted.  Psychiatric: Her mood appears anxious. Her affect is labile. Her affect is not angry. Her speech is tangential. Her speech is not slurred. She is agitated (when assisted with peronal care) and combative (when she doesn't understad  what you want her to do). She is not aggressive, not hyperactive and not withdrawn. Thought content is not paranoid and not delusional. Cognition and memory are impaired. She expresses impulsivity and inappropriate judgment. She does not exhibit a depressed mood. She exhibits abnormal recent memory and abnormal remote memory.     Filed Vitals:   06/28/13 1500  BP: 124/66  Pulse: 68  Temp: 97.4 F (36.3 C)  TempSrc: Tympanic  Resp: 20      Labs reviewed: Basic Metabolic Panel:  Recent Labs  69/62/95  NA 135*  K 4.2  BUN 23*  CREATININE 0.8    Liver Function Tests:  Recent Labs  02/21/13  AST 15  ALT 10  ALKPHOS 99    CBC:  Recent Labs  02/21/13  WBC 14.0  HGB 14.2  HCT 41  PLT 308    Anemia Panel: No results found for this basename: IRON, FOLATE, VITAMINB12,  in the last 8760 hours  Significant Diagnostic Results:     Assessment/Plan Anxiety state, unspecified Continue with Celexa 5mg  and Ativan 0.25mg  qam and bid prn for anxiety      Essential hypertension, benign Controlled and continue with Lisinopril 10mg       Dementia Continue supportive care in SNF      Unspecified constipation Takes Tylenol 650mg  bid.     GERD (gastroesophageal reflux disease) Stable on Omeprazole and Carafate 10mg  ac and hs.         Family/ staff Communication: observe the patient.    Goals of care: SNF   Labs/tests ordered none

## 2013-06-28 NOTE — Assessment & Plan Note (Signed)
Controlled and continue with Lisinopril 10mg  

## 2013-07-31 ENCOUNTER — Non-Acute Institutional Stay (SKILLED_NURSING_FACILITY): Payer: Medicare Other | Admitting: Nurse Practitioner

## 2013-07-31 DIAGNOSIS — K5900 Constipation, unspecified: Secondary | ICD-10-CM

## 2013-07-31 DIAGNOSIS — F0391 Unspecified dementia with behavioral disturbance: Secondary | ICD-10-CM

## 2013-07-31 DIAGNOSIS — F411 Generalized anxiety disorder: Secondary | ICD-10-CM

## 2013-07-31 DIAGNOSIS — I10 Essential (primary) hypertension: Secondary | ICD-10-CM

## 2013-07-31 DIAGNOSIS — K219 Gastro-esophageal reflux disease without esophagitis: Secondary | ICD-10-CM

## 2013-07-31 NOTE — Assessment & Plan Note (Signed)
Controlled and continue with Lisinopril 10mg  

## 2013-07-31 NOTE — Assessment & Plan Note (Signed)
Stable on Omeprazole bid and Carafate 10mg  bid

## 2013-07-31 NOTE — Assessment & Plan Note (Signed)
Continue with Celexa 5mg  and increase Ativan 0.5mg  qam and bid prn for anxiety, obtain UA C/S, CBC, CMP, TSH for her restlessness

## 2013-07-31 NOTE — Assessment & Plan Note (Signed)
Stable on MiraLax daily.  

## 2013-07-31 NOTE — Assessment & Plan Note (Signed)
Continue supportive care in SNF and off all memory preserving medications   

## 2013-07-31 NOTE — Progress Notes (Signed)
Subjective:     Patient ID: [name], female   DOB: 12-23-1917, 79 y.o.   MRN: [id number] Code Status: DNR  Allergies  Allergen Reactions  . Morphine And Related   . Oxytrol (Oxybutynin)     Chief Complaint  Patient presents with  . Medical Managment of Chronic Issues    restlessness.     HPI: Patient is a 79 y.o. female seen in the SNF at [facility] today for evaluation of restlessness-pulling on her adult depends frequently and other chronic medical conditions.  Problem List Items Addressed This Visit   Anxiety state, unspecified - Primary     Continue with Celexa 5mg  and increase Ativan 0.5mg  qam and bid prn for anxiety, obtain UA C/S, CBC, CMP, TSH for her restlessness          Dementia     Continue supportive care in SNF and off all memory preserving medications          Essential hypertension, benign     Controlled and continue with Lisinopril 10mg           GERD (gastroesophageal reflux disease)     Stable on Omeprazole bid and Carafate 10mg  bid        Unspecified constipation     Stable on MiraLax daily.          Review of Systems:  Review of Systems  Constitutional: Negative for fever and weight loss.  HENT: Negative for hearing loss, congestion and neck pain.   Eyes: Negative.   Respiratory: Negative for cough, sputum production and wheezing.   Cardiovascular: Negative for chest pain, orthopnea and leg swelling.  Gastrointestinal: Negative for nausea, vomiting, abdominal pain, diarrhea and constipation.  Genitourinary: Positive for frequency (incontinent of bladder). Negative for urgency and flank pain.  Musculoskeletal: Positive for falls (if not supervised). Negative for back pain and joint pain.  Skin: Negative for itching and rash.  Neurological: Negative for tremors, speech change, focal weakness, seizures, loss of consciousness and weakness.  Endo/Heme/Allergies: Negative for polydipsia. Does not bruise/bleed easily.   Psychiatric/Behavioral: Positive for memory loss. Negative for depression and hallucinations. The patient is nervous/anxious (more freqeunt restlessness). The patient does not have insomnia.      Past Medical History  Diagnosis Date  . Malignant neoplasm of colon, unspecified site   . Anxiety state, unspecified   . Alzheimer's disease   . Unspecified essential hypertension   . Chronic airway obstruction, not elsewhere classified   . Barrett's esophagus   . Osteoarthrosis, unspecified whether generalized or localized, unspecified site   . Pain in joint, shoulder region   . Osteoporosis, unspecified   . Shortness of breath   . Flatulence, eructation, and gas pain   . Urinary frequency   . Other abnormal blood chemistry   . Pressure ulcer, heel(707.07)   . Other specified disease of white blood cells 02/24/2013  . Acute conjunctivitis, unspecified 02/24/2013  . Pneumonia, organism unspecified 02/24/2013  . Hypotension, unspecified 02/21/2013  . Acute bronchitis 02/21/2013  . Hemorrhage of gastrointestinal tract, unspecified 02/21/2013  . Altered mental status 02/21/2013  . Contusion of face, scalp, and neck except eye(s) 06/13/2012  . Unspecified fall 06/13/2012  . Hyposmolality and/or hyponatremia 05/17/2012  . Joint pain of lower extremity 04/15/2012  . Acute upper respiratory infections of unspecified site 04/24/2011  . Hordeolum externum 03/31/2011  . Contact dermatitis and other eczema, due to unspecified cause 09/26/2010  . Candidiasis of other urogenital sites 07/15/2010  . Rash  and other nonspecific skin eruption 07/15/2010  . Acute bronchitis 07/01/2010  . Unspecified vitamin D deficiency 05/07/2010  . Altered mental status 04/11/2010  . Closed fracture of distal end of ulna (alone) 04/12  . Other specified disease of nail 08/16/2009  . Disorders of bursae and tendons in shoulder region, unspecified 06/21/2009  . Pain in joint, pelvic region and thigh 04/12/2009  .  Anemia, unspecified 04/05/2009  . Hip joint replacement by other means 03/23/2009  . Unspecified constipation 10/09/2008  . Unspecified essential hypertension 09/28/2008  . Other abnormal blood chemistry 05/15/2008  . Other malaise and fatigue 11/09/2006  . Malignant neoplasm of colon, unspecified site 07/28/2005  . Aortic aneurysm of unspecified site without mention of rupture 07/28/2005  . Osteoarthrosis, unspecified whether generalized or localized, unspecified site 07/28/2005  . Pain in joint, shoulder region 07/28/2005  . Osteoporosis, unspecified 07/28/2005  . Other emphysema 07/29/1999  . Raynaud's syndrome 07/28/1998   Past Surgical History  Procedure Laterality Date  . Dilation and curettage of uterus    . Colon surgery    . Eye surgery    . Joint replacement     Social History:   reports that she has never smoked. She does not have any smokeless tobacco history on file. She reports that she does not drink alcohol. Her drug history is not on file.  Family History  Problem Relation Age of Onset  . Stroke Mother     Medications: Patient's Medications  New Prescriptions   No medications on file  Previous Medications   ACETAMINOPHEN (TYLENOL) 325 MG TABLET    Take 650 mg by mouth 2 (two) times daily. Take 2 tablets every 6 hours as needed .   CALCIUM-VITAMIN D-VITAMIN K (VIACTIV) 500-500-40 MG-UNT-MCG CHEW    Chew 1 tablet by mouth daily.   CITALOPRAM (CELEXA) 10 MG TABLET    Take 5 mg by mouth daily. Take /12 daily to help nerves and depression.   ERGOCALCIFEROL (VITAMIN D2) 50000 UNITS CAPSULE    Take 50,000 Units by mouth once a week.   LISINOPRIL (PRINIVIL,ZESTRIL) 10 MG TABLET    Take 10 mg by mouth daily.   LORAZEPAM (ATIVAN) 0.5 MG TABLET    Take 0.5 mg by mouth See admin instructions. TAke daily in the morning and twice as needed for anxiety throughout the day   OMEPRAZOLE (PRILOSEC) 20 MG CAPSULE    Take 20 mg by mouth 2 (two) times daily.    POLYETHYLENE GLYCOL  (MIRALAX / GLYCOLAX) PACKET    Take 17 g by mouth daily.   SUCRALFATE (CARAFATE) 1 GM/10ML SUSPENSION    Take 1 g by mouth 2 (two) times daily.   Modified Medications   No medications on file  Discontinued Medications   MEMANTINE (NAMENDA) 10 MG TABLET    Take 10 mg by mouth 2 (two) times daily. Take 1 tablet daily for memory.     Physical Exam: (type .physexam) Filed Vitals:   07/31/13 1730  BP: 108/62  Pulse: 68  Temp: 97.6 F (36.4 C)  TempSrc: Tympanic  Resp: 18      Labs reviewed: Basic Metabolic Panel:  Recent Labs  46/96/29  NA 135*  K 4.2  BUN 23*  CREATININE 0.8   Liver Function Tests:  Recent Labs  02/21/13  AST 15  ALT 10  ALKPHOS 99   No results found for this basename: LIPASE, AMYLASE,  in the last 8760 hours No results found for this basename: AMMONIA,  in  the last 8760 hours CBC:  Recent Labs  02/21/13  WBC 14.0  HGB 14.2  HCT 41  PLT 308   Lipid Panel: No results found for this basename: CHOL, HDL, LDLCALC, TRIG, CHOLHDL, LDLDIRECT,  in the last 8760 hours Anemia Panel: No results found for this basename: FOLATE, IRON, VITAMINB12,  in the last 8760 hours  Past Procedures:     Assessment/Plan Anxiety state, unspecified Continue with Celexa 5mg  and increase Ativan 0.5mg  qam and bid prn for anxiety, obtain UA C/S, CBC, CMP, TSH for her restlessness        Dementia Continue supportive care in SNF and off all memory preserving medications        Essential hypertension, benign Controlled and continue with Lisinopril 10mg         GERD (gastroesophageal reflux disease) Stable on Omeprazole bid and Carafate 10mg  bid      Unspecified constipation Stable on MiraLax daily.       Family/ Staff Communication: observe the patient.   Goals of Care: SNF  Labs/tests ordered: CBC, CMP, Cath UA C/S, TSH    HPI   Review of Systems  Constitutional: Negative for fever and weight loss.  HENT: Negative for  hearing loss, congestion and neck pain.   Eyes: Negative.   Respiratory: Negative for cough, sputum production and wheezing.   Cardiovascular: Negative for chest pain, orthopnea and leg swelling.  Gastrointestinal: Negative for nausea, vomiting, abdominal pain, diarrhea and constipation.  Endocrine: Negative for polydipsia.  Genitourinary: Positive for frequency (incontinent of bladder). Negative for urgency and flank pain.  Musculoskeletal: Positive for falls (if not supervised). Negative for back pain and joint pain.  Skin: Negative for itching and rash.  Neurological: Negative for tremors, speech change, focal weakness, seizures, loss of consciousness and weakness.  Hematological: Does not bruise/bleed easily.  Psychiatric/Behavioral: Positive for memory loss. Negative for depression and hallucinations. The patient is nervous/anxious (more freqeunt restlessness). The patient does not have insomnia.        Objective:   Physical Exam  Constitutional: She appears well-developed and well-nourished.  HENT:  Head: Normocephalic and atraumatic.  Eyes: Conjunctivae and EOM are normal.  Neck: Normal range of motion. Neck supple. No JVD present. No thyromegaly present.  Cardiovascular: Normal rate, regular rhythm and normal heart sounds.   No murmur heard. Pulmonary/Chest: Effort normal. She has no wheezes. She has rales (bibasilar dry rales appreciated).  Abdominal: Soft. Bowel sounds are normal. There is no tenderness.  Musculoskeletal: Normal range of motion. She exhibits no edema and no tenderness.  Neurological: She is alert. No cranial nerve deficit. She exhibits normal muscle tone. Coordination normal.  Skin: Skin is warm and dry. No rash noted.  Psychiatric: Her mood appears anxious. Her affect is labile. Her affect is not angry. Her speech is tangential. Her speech is not slurred. She is agitated (when assisted with peronal care) and combative (when she doesn't understad what you want  her to do). She is not aggressive, not hyperactive and not withdrawn. Thought content is not paranoid and not delusional. Cognition and memory are impaired. She expresses impulsivity and inappropriate judgment. She does not exhibit a depressed mood. She exhibits abnormal recent memory and abnormal remote memory.       Assessment:         Plan:

## 2013-08-01 LAB — TSH: TSH: 0.8 u[IU]/mL (ref 0.41–5.90)

## 2013-08-01 LAB — BASIC METABOLIC PANEL WITH GFR
BUN: 25 mg/dL — AB (ref 4–21)
Creatinine: 0.9 mg/dL (ref 0.5–1.1)
Glucose: 88 mg/dL
Potassium: 3.8 mmol/L (ref 3.4–5.3)
Sodium: 140 mmol/L (ref 137–147)

## 2013-08-01 LAB — CBC AND DIFFERENTIAL
Platelets: 326 10*3/uL (ref 150–399)
WBC: 7.8 10^3/mL

## 2013-08-18 ENCOUNTER — Encounter: Payer: Self-pay | Admitting: Internal Medicine

## 2013-08-18 DIAGNOSIS — J4489 Other specified chronic obstructive pulmonary disease: Secondary | ICD-10-CM | POA: Insufficient documentation

## 2013-08-18 DIAGNOSIS — J449 Chronic obstructive pulmonary disease, unspecified: Secondary | ICD-10-CM | POA: Insufficient documentation

## 2013-08-18 DIAGNOSIS — M810 Age-related osteoporosis without current pathological fracture: Secondary | ICD-10-CM | POA: Insufficient documentation

## 2013-08-18 DIAGNOSIS — C189 Malignant neoplasm of colon, unspecified: Secondary | ICD-10-CM | POA: Insufficient documentation

## 2013-08-18 DIAGNOSIS — Z299 Encounter for prophylactic measures, unspecified: Secondary | ICD-10-CM

## 2013-08-18 DIAGNOSIS — M25519 Pain in unspecified shoulder: Secondary | ICD-10-CM | POA: Insufficient documentation

## 2013-08-18 DIAGNOSIS — I10 Essential (primary) hypertension: Secondary | ICD-10-CM | POA: Insufficient documentation

## 2013-08-18 DIAGNOSIS — F0280 Dementia in other diseases classified elsewhere without behavioral disturbance: Secondary | ICD-10-CM | POA: Insufficient documentation

## 2013-08-18 DIAGNOSIS — R739 Hyperglycemia, unspecified: Secondary | ICD-10-CM | POA: Insufficient documentation

## 2013-08-18 DIAGNOSIS — Z66 Do not resuscitate: Secondary | ICD-10-CM

## 2013-08-18 DIAGNOSIS — K2270 Barrett's esophagus without dysplasia: Secondary | ICD-10-CM | POA: Insufficient documentation

## 2013-08-18 DIAGNOSIS — M1990 Unspecified osteoarthritis, unspecified site: Secondary | ICD-10-CM | POA: Insufficient documentation

## 2013-08-18 NOTE — Progress Notes (Signed)
Date: 08/18/2013  MRN:  [id number] Name:  [name] Sex:  female Age:  79 y.o. DOB:1917/02/18                   Facility/Room; [facility], 16 Level Of Care: SNF  Emergency Contacts: Contact Information   Name Relation Home Work Mobile   SMITH,SALLIE  [phone]        Code Status: DNR  Allergies: Allergies  Allergen Reactions  . Morphine And Related   . Oxytrol [Oxybutynin]      Chief Complaint  Patient presents with  . Medical Managment of Chronic Issues    transfer from [facility] for Medicaid qualifying bed     HPI: Chronically demented woman previously lived at [facility], but has now transferred to [facility] because she needed a medicare qualifying bed.   Alzheimer's disease; steadily progressing. No behavioral issues  Malignant neoplasm of colon, unspecified site: no relapse evident  Unspecified essential hypertension: controlled  Barrett's esophagus: no dysphagia or pain with swallowing  Hyperglycemia: routine lab rechecks  Osteoarthrosis, unspecified whether generalized or localized, unspecified site: geneeralized discomfort without focality.    Past Medical History  Diagnosis Date  . Malignant neoplasm of colon, unspecified site   . Anxiety state, unspecified   . Alzheimer's disease   . Unspecified essential hypertension   . Chronic airway obstruction, not elsewhere classified   . Barrett's esophagus   . Osteoarthrosis, unspecified whether generalized or localized, unspecified site   . Pain in joint, shoulder region   . Osteoporosis, unspecified   . Shortness of breath   . Flatulence, eructation, and gas pain   . Urinary frequency   . Hyperglycemia   . Pressure ulcer, heel(707.07)   . Other specified disease of white blood cells 02/24/2013  . Acute conjunctivitis, unspecified 02/24/2013  . Pneumonia, organism unspecified 02/24/2013  . Hypotension, unspecified 02/21/2013  . Acute bronchitis 02/21/2013  . Hemorrhage of gastrointestinal tract, unspecified  02/21/2013  . Altered mental status 02/21/2013  . Contusion of face, scalp, and neck except eye(s) 06/13/2012  . Unspecified fall 06/13/2012  . Hyposmolality and/or hyponatremia 05/17/2012  . Joint pain of lower extremity 04/15/2012  . Acute upper respiratory infections of unspecified site 04/24/2011  . Hordeolum externum 03/31/2011  . Contact dermatitis and other eczema, due to unspecified cause 09/26/2010  . Candidiasis of other urogenital sites 07/15/2010  . Rash and other nonspecific skin eruption 07/15/2010  . Unspecified vitamin D deficiency 05/07/2010  . Altered mental status 04/11/2010  . Closed fracture of distal end of ulna (alone) 04/12  . Other specified disease of nail 08/16/2009  . Disorders of bursae and tendons in shoulder region, unspecified 06/21/2009  . Pain in joint, pelvic region and thigh 04/12/2009  . Anemia, unspecified 04/05/2009  . Hip joint replacement by other means 03/23/2009  . Unspecified constipation 10/09/2008  . Other malaise and fatigue 11/09/2006  . Aortic aneurysm of unspecified site without mention of rupture 07/28/2005  . Osteoarthrosis, unspecified whether generalized or localized, unspecified site 07/28/2005  . Pain in joint, shoulder region 07/28/2005  . Osteoporosis, unspecified 07/28/2005  . Other emphysema 07/29/1999  . Raynaud's syndrome 07/28/1998    Past Surgical History  Procedure Laterality Date  . Dilation and curettage of uterus    . Colon surgery  2001    partial colon resecton for cancer  . Cataract extraction w/ intraocular lens  implant, bilateral  2004  . Total hip arthroplasty Left 2010     Procedures: 09/17/2005 Abdominal Ultrasound  02/16/2005 Mammogram [facility] [place] 10/14/2005 Pulmonary Function Test  2005 Colonoscopy [facility] [place] 04/20/2007 Echocardiogram  07/05/08 CT brain: possible early communicating hydrocephaly 10/10/2008-CT Abdomen and Pelvis:Renal cysts. No evidence of metastatic disease or inflammatory  process. 04-01-09 EKG sinus tachycardia, T wave abnormality, otherwise normal  04-06-09 Abd series no acute cardiopulmonary process, gas in the colon without evidence of obstruction 04-12-09 CT abd and pelvis with CM no acute findings, distal esophageal wall thickening suspicious for esophagitis or mass 04-14-09 X-ray pelvis fracture fragments having realigned X-ray left hip interval placement of left femoral long stem gamma nail with distal aspect not visualized  04-15-09 Barium swallow no evidence of high grade stricture or obstructing mass 04/12/09 CXR cardiomegaly without acute cardiopulmonary findings.  06/20/09 X-ayr left shoulder moderate to advanced OA slight superior subluxation of the proximal humerus at eh glenohumeral joint consistent with chronic rotator cuff injury 08/30/09 CXR cardiomegaly with mild venous congestion 04/06/10 X-ray left hand/wrist: arthritic change or the hand w/o focal fx, acute distal ulna diaphyseal fx, degenerative changes of the wrist joint space.   Consultants: Dr Theresia Lo Salomon Fick [place] Dr Roderic Palau Primary Care Physician Dr. Appert/ Gilmer Mor, Allergy Dr. Charlann Boxer, orthopedics.   Current Outpatient Prescriptions  Medication Sig Dispense Refill  . acetaminophen (TYLENOL) 325 MG tablet Take 650 mg by mouth 2 (two) times daily. Take 2 tablets every 6 hours as needed .      Marland Kitchen Calcium-Vitamin D-Vitamin K (VIACTIV) 500-500-40 MG-UNT-MCG CHEW Chew 1 tablet by mouth daily.      . citalopram (CELEXA) 10 MG tablet Take 5 mg by mouth daily. Take /12 daily to help nerves and depression.      . ergocalciferol (VITAMIN D2) 50000 UNITS capsule Take 50,000 Units by mouth once a week.      Marland Kitchen lisinopril (PRINIVIL,ZESTRIL) 10 MG tablet Take 10 mg by mouth daily.      Marland Kitchen LORazepam (ATIVAN) 0.5 MG tablet Take 0.5 mg by mouth See admin instructions. TAke daily in the morning and twice as needed for anxiety throughout the day      . omeprazole (PRILOSEC) 20 MG capsule Take 20  mg by mouth 2 (two) times daily.       . polyethylene glycol (MIRALAX / GLYCOLAX) packet Take 17 g by mouth daily.      . sucralfate (CARAFATE) 1 GM/10ML suspension Take 1 g by mouth 2 (two) times daily.        No current facility-administered medications for this visit.     There is no immunization history on file for this patient.   Diet: unrestricted  History  Substance Use Topics  . Smoking status: Never Smoker   . Smokeless tobacco: Not on file  . Alcohol Use: No   SOCIAL HISTORY:   MARITAL HISTORY:   Widowed. HOUSING:  Patient lives in an assisted living facility since 2009.  Previously lived independently in [place], [place]. Moved to [facility] July 2006 independent apt. SNF since 2010 LIVING WILL:  The patient has a living will. Verbalizes: No Tube Feeding OCCUPATION:    retired TOBACCO USE:   Has no significant smoking history. ALCOHOL:   Does not give any significant history of alcohol usage. CAFFEINE:  Does not use caffeinated beverages. EXERCISES:   The exercise is predominantly swimming, walking. DIET:   "Watches" what she eats due to Barrett's Disease  PETS IN HOME:  The patient has no pets.    Family History  Problem Relation Age of Onset  .  Stroke Mother    FAMILY HISTORY:   FATHER:     Heat Stroke MOTHER:    Stroke SIBLINGS:   1)   Dalen Bottoms L & W  2)   Naomi Woods L & W  CHILDREN:   1)   H. York Ram son L & W 2)   Donia Ast daughter L & W 3)   Darnell Stimson Diffey daughter L & W  Review of Systems  Constitutional: Negative.   HENT: Positive for hearing loss and rhinorrhea.   Eyes: Negative.   Respiratory: Negative.   Cardiovascular: Negative.   Gastrointestinal: Negative.   Endocrine: Negative.   Genitourinary:       Increased urinary frequency  Musculoskeletal:       Unstable gait. Left wrist swollen  Neurological:       Unstready gait. Significant dementia dating back to at least 2007.  Psychiatric/Behavioral: The patient is  nervous/anxious.      Vital signs: BP 118/76  Pulse 72  Temp(Src) 98.4 F (36.9 C)  Resp 20  Ht 5\' 3"  (1.6 m)  Wt 133 lb 4.8 oz (60.464 kg)  BMI 23.62 kg/m2  Physical Exam  Constitutional:  Elderly, thin frail  HENT:  Head: Normocephalic and atraumatic.  Right Ear: External ear normal.  Hearing loss.  Eyes:  Corrective lenses. IOLI.  Neck: No JVD present. No tracheal deviation present. No thyromegaly present.  Cardiovascular: Normal rate, regular rhythm, normal heart sounds and intact distal pulses.  Exam reveals no gallop and no friction rub.   No murmur heard. Pulmonary/Chest: Breath sounds normal. No respiratory distress. She has no rales.  Abdominal: She exhibits no distension and no mass. There is no tenderness.  Musculoskeletal: She exhibits edema.  Lymphadenopathy:    She has no cervical adenopathy.  Neurological: No cranial nerve deficit.  Demented. Unstable gait.  Skin: No rash noted. No erythema.  Psychiatric: Affect normal.     Screening Score  MMS    PHQ2    PHQ9     Fall Risk    BIMS    Annual summary: Hospitalizations: none in the last year Infection History: none of significance Functional assessment: assist with bathing, personal care, toileting, tray set up,, waking Areas of potential improvement: none Rehabilitation Potential: none Prognosis for survival: fair  Plan: Alzheimer's disease: slow steady worsening and increasing dependencies. i expect her to be a long term care resident until her death.  Malignant neoplasm of colon, unspecified site: no sign of relapse  Unspecified essential hypertension: controlled  Barrett's esophagus: asymptomatic  Hyperglycemia: normal on recent lab. Continue to check periodically  Osteoarthrosis, unspecified whether generalized or localized, unspecified site

## 2013-08-18 NOTE — Progress Notes (Signed)
  Subjective:    Patient ID: [name], female    DOB: 03-14-1917, 79 y.o.   MRN: [id number]  HPI    Review of Systems     Objective:   Physical Exam    LABS REVIEW: 08/01/13 CBC normal, CMP normal, TSH 0.796   08/02/2013 Urine normal, culture negative    Assessment & Plan:

## 2013-09-04 ENCOUNTER — Encounter: Payer: Self-pay | Admitting: Nurse Practitioner

## 2013-09-04 ENCOUNTER — Non-Acute Institutional Stay (SKILLED_NURSING_FACILITY): Payer: Medicare Other | Admitting: Nurse Practitioner

## 2013-09-04 DIAGNOSIS — K219 Gastro-esophageal reflux disease without esophagitis: Secondary | ICD-10-CM

## 2013-09-04 DIAGNOSIS — M25519 Pain in unspecified shoulder: Secondary | ICD-10-CM

## 2013-09-04 DIAGNOSIS — K5900 Constipation, unspecified: Secondary | ICD-10-CM

## 2013-09-04 DIAGNOSIS — I10 Essential (primary) hypertension: Secondary | ICD-10-CM

## 2013-09-04 DIAGNOSIS — F0280 Dementia in other diseases classified elsewhere without behavioral disturbance: Secondary | ICD-10-CM

## 2013-09-04 DIAGNOSIS — F411 Generalized anxiety disorder: Secondary | ICD-10-CM

## 2013-09-04 NOTE — Assessment & Plan Note (Signed)
Controlled and continue with Lisinopril 10mg  

## 2013-09-04 NOTE — Assessment & Plan Note (Signed)
Takes Tylenol 650mg  bid

## 2013-09-04 NOTE — Assessment & Plan Note (Signed)
Stable on Omeprazole 20 daily  and off Carafate

## 2013-09-04 NOTE — Assessment & Plan Note (Signed)
Continue supportive care in SNF and off all memory preserving medications   

## 2013-09-04 NOTE — Progress Notes (Signed)
Patient ID: SUMMER MCCOLGAN, female   DOB: 04/29/1917, 79 y.o.   MRN: [id number] Code Status: DNR  Allergies  Allergen Reactions  . Morphine And Related   . Oxytrol [Oxybutynin]     Chief Complaint  Patient presents with  . Medical Managment of Chronic Issues    HPI: Patient is a 79 y.o. female seen in the SNF at [facility] today for evaluation of her chronic medical conditions.  Problem List Items Addressed This Visit   Alzheimer's disease     Continue supportive care in SNF and off all memory preserving medications            Anxiety state, unspecified     Manageable with Celexa 5mg  and Ativan 0.5mg  qam            Essential hypertension, benign     Controlled and continue with Lisinopril 10mg             GERD (gastroesophageal reflux disease) - Primary     Stable on Omeprazole 20 daily  and off Carafate           Pain in joint, shoulder region     Takes Tylenol 650mg  bid     Unspecified constipation     Stable on MiraLax daily.            Review of Systems:  Review of Systems  Constitutional: Negative for fever and weight loss.  HENT: Negative for hearing loss, congestion and neck pain.   Eyes: Negative.   Respiratory: Negative for cough, sputum production and wheezing.   Cardiovascular: Negative for chest pain, orthopnea and leg swelling.  Gastrointestinal: Negative for nausea, vomiting, abdominal pain, diarrhea and constipation.  Genitourinary: Positive for frequency (incontinent of bladder). Negative for urgency and flank pain.  Musculoskeletal: Positive for falls (if not supervised). Negative for back pain and joint pain.  Skin: Negative for itching and rash.  Neurological: Negative for tremors, speech change, focal weakness, seizures, loss of consciousness and weakness.  Endo/Heme/Allergies: Negative for polydipsia. Does not bruise/bleed easily.  Psychiatric/Behavioral: Positive for memory loss. Negative for  depression and hallucinations. The patient is nervous/anxious (more freqeunt restlessness). The patient does not have insomnia.      Past Medical History  Diagnosis Date  . Malignant neoplasm of colon, unspecified site   . Anxiety state, unspecified   . Alzheimer's disease   . Unspecified essential hypertension   . Chronic airway obstruction, not elsewhere classified   . Barrett's esophagus   . Osteoarthrosis, unspecified whether generalized or localized, unspecified site   . Pain in joint, shoulder region   . Osteoporosis, unspecified   . Shortness of breath   . Flatulence, eructation, and gas pain   . Urinary frequency   . Hyperglycemia   . Pressure ulcer, heel(707.07)   . Other specified disease of white blood cells 02/24/2013  . Acute conjunctivitis, unspecified 02/24/2013  . Pneumonia, organism unspecified 02/24/2013  . Hypotension, unspecified 02/21/2013  . Acute bronchitis 02/21/2013  . Hemorrhage of gastrointestinal tract, unspecified 02/21/2013  . Altered mental status 02/21/2013  . Contusion of face, scalp, and neck except eye(s) 06/13/2012  . Unspecified fall 06/13/2012  . Hyposmolality and/or hyponatremia 05/17/2012  . Joint pain of lower extremity 04/15/2012  . Acute upper respiratory infections of unspecified site 04/24/2011  . Hordeolum externum 03/31/2011  . Contact dermatitis and other eczema, due to unspecified cause 09/26/2010  . Candidiasis of other urogenital sites 07/15/2010  . Rash  and other nonspecific skin eruption 07/15/2010  . Unspecified vitamin D deficiency 05/07/2010  . Altered mental status 04/11/2010  . Closed fracture of distal end of ulna (alone) 04/12  . Other specified disease of nail 08/16/2009  . Disorders of bursae and tendons in shoulder region, unspecified 06/21/2009  . Pain in joint, pelvic region and thigh 04/12/2009  . Anemia, unspecified 04/05/2009  . Hip joint replacement by other means 03/23/2009  . Unspecified constipation  10/09/2008  . Other malaise and fatigue 11/09/2006  . Aortic aneurysm of unspecified site without mention of rupture 07/28/2005  . Osteoarthrosis, unspecified whether generalized or localized, unspecified site 07/28/2005  . Pain in joint, shoulder region 07/28/2005  . Osteoporosis, unspecified 07/28/2005  . Other emphysema 07/29/1999  . Raynaud's syndrome 07/28/1998   Past Surgical History  Procedure Laterality Date  . Dilation and curettage of uterus    . Colon surgery  2001    partial colon resecton for cancer  . Cataract extraction w/ intraocular lens  implant, bilateral  2004  . Total hip arthroplasty Left 2010   Social History:   reports that she has never smoked. She does not have any smokeless tobacco history on file. She reports that she does not drink alcohol. Her drug history is not on file.  Family History  Problem Relation Age of Onset  . Stroke Mother     Medications: Patient's Medications  New Prescriptions   No medications on file  Previous Medications   ACETAMINOPHEN (TYLENOL) 325 MG TABLET    Take 650 mg by mouth 2 (two) times daily. Take 2 tablets every 6 hours as needed .   CALCIUM-VITAMIN D-VITAMIN K (VIACTIV) 500-500-40 MG-UNT-MCG CHEW    Chew 1 tablet by mouth daily.   CITALOPRAM (CELEXA) 10 MG TABLET    Take 5 mg by mouth daily. Take /12 daily to help nerves and depression.   ERGOCALCIFEROL (VITAMIN D2) 50000 UNITS CAPSULE    Take 50,000 Units by mouth once a week.   LISINOPRIL (PRINIVIL,ZESTRIL) 10 MG TABLET    Take 10 mg by mouth daily.   LORAZEPAM (ATIVAN) 0.5 MG TABLET    Take 0.5 mg by mouth See admin instructions. TAke daily in the morning and twice as needed for anxiety throughout the day   OMEPRAZOLE (PRILOSEC) 20 MG CAPSULE    Take 20 mg by mouth 2 (two) times daily.    POLYETHYLENE GLYCOL (MIRALAX / GLYCOLAX) PACKET    Take 17 g by mouth daily.   SUCRALFATE (CARAFATE) 1 GM/10ML SUSPENSION    Take 1 g by mouth 2 (two) times daily.   Modified  Medications   No medications on file  Discontinued Medications   No medications on file     Physical Exam: .phy Filed Vitals:   09/04/13 1226  BP: 104/72  Pulse: 68  Temp: 97.6 F (36.4 C)  TempSrc: Tympanic  Resp: 16      Labs reviewed: Basic Metabolic Panel:  Recent Labs  16/10/96  NA 135*  K 4.2  BUN 23*  CREATININE 0.8   Liver Function Tests:  Recent Labs  02/21/13  AST 15  ALT 10  ALKPHOS 99   CBC:  Recent Labs  02/21/13  WBC 14.0  HGB 14.2  HCT 41  PLT 308   Assessment/Plan GERD (gastroesophageal reflux disease) Stable on Omeprazole 20 daily  and off Carafate         Anxiety state, unspecified Manageable with Celexa 5mg  and Ativan 0.5mg  qam  Alzheimer's disease Continue supportive care in SNF and off all memory preserving medications          Essential hypertension, benign Controlled and continue with Lisinopril 10mg           Unspecified constipation Stable on MiraLax daily.       Pain in joint, shoulder region Takes Tylenol 650mg  bid     Family/ Staff Communication: observe the patient.   Goals of Care: SNF  Labs/tests ordered: none    HPI   Review of Systems  Constitutional: Negative for fever and weight loss.  HENT: Negative for hearing loss, congestion and neck pain.   Eyes: Negative.   Respiratory: Negative for cough, sputum production and wheezing.   Cardiovascular: Negative for chest pain, orthopnea and leg swelling.  Gastrointestinal: Negative for nausea, vomiting, abdominal pain, diarrhea and constipation.  Endocrine: Negative for polydipsia.  Genitourinary: Positive for frequency (incontinent of bladder). Negative for urgency and flank pain.  Musculoskeletal: Positive for falls (if not supervised). Negative for back pain and joint pain.  Skin: Negative for itching and rash.  Neurological: Negative for tremors, speech change, focal weakness, seizures, loss of  consciousness and weakness.  Hematological: Does not bruise/bleed easily.  Psychiatric/Behavioral: Positive for memory loss. Negative for depression and hallucinations. The patient is nervous/anxious (more freqeunt restlessness). The patient does not have insomnia.        Objective:   Physical Exam  Constitutional: She appears well-developed and well-nourished.  HENT:  Head: Normocephalic and atraumatic.  Eyes: Conjunctivae and EOM are normal.  Neck: Normal range of motion. Neck supple. No JVD present. No thyromegaly present.  Cardiovascular: Normal rate, regular rhythm and normal heart sounds.   No murmur heard. Pulmonary/Chest: Effort normal. She has no wheezes. She has rales (bibasilar dry rales appreciated).  Abdominal: Soft. Bowel sounds are normal. There is no tenderness.  Musculoskeletal: Normal range of motion. She exhibits no edema and no tenderness.  Neurological: She is alert. No cranial nerve deficit. She exhibits normal muscle tone. Coordination normal.  Skin: Skin is warm and dry. No rash noted.  Psychiatric: Her mood appears anxious. Her affect is labile. Her affect is not angry. Her speech is tangential. Her speech is not slurred. She is agitated (when assisted with peronal care) and combative (when she doesn't understad what you want her to do). She is not aggressive, not hyperactive and not withdrawn. Thought content is not paranoid and not delusional. Cognition and memory are impaired. She expresses impulsivity and inappropriate judgment. She does not exhibit a depressed mood. She exhibits abnormal recent memory and abnormal remote memory.       Assessment:         Plan:

## 2013-09-04 NOTE — Assessment & Plan Note (Signed)
Stable on MiraLax daily.  

## 2013-09-04 NOTE — Assessment & Plan Note (Signed)
Manageable with Celexa 5mg and Ativan 0.5mg qam           

## 2013-09-11 ENCOUNTER — Encounter: Payer: Self-pay | Admitting: Nurse Practitioner

## 2013-09-11 ENCOUNTER — Non-Acute Institutional Stay (SKILLED_NURSING_FACILITY): Payer: Medicare Other | Admitting: Nurse Practitioner

## 2013-09-11 DIAGNOSIS — M1990 Unspecified osteoarthritis, unspecified site: Secondary | ICD-10-CM

## 2013-09-11 DIAGNOSIS — K2270 Barrett's esophagus without dysplasia: Secondary | ICD-10-CM

## 2013-09-11 DIAGNOSIS — H11149 Conjunctival xerosis, unspecified, unspecified eye: Secondary | ICD-10-CM

## 2013-09-11 DIAGNOSIS — K5900 Constipation, unspecified: Secondary | ICD-10-CM

## 2013-09-11 DIAGNOSIS — F411 Generalized anxiety disorder: Secondary | ICD-10-CM

## 2013-09-11 DIAGNOSIS — I10 Essential (primary) hypertension: Secondary | ICD-10-CM

## 2013-09-11 DIAGNOSIS — E507 Other ocular manifestations of vitamin A deficiency: Secondary | ICD-10-CM

## 2013-09-11 NOTE — Assessment & Plan Note (Signed)
Tylenol 650mg bid is adequate.  

## 2013-09-11 NOTE — Progress Notes (Signed)
Patient ID: [name], female   DOB: [birth year], 79 y.o.   MRN: [id number] Code Status: DNR  Allergies  Allergen Reactions  . Morphine And Related   . Oxytrol [Oxybutynin]     Chief Complaint  Patient presents with  . Medical Managment of Chronic Issues    dry and irritated eyes.     HPI: Patient is a 79 y.o. female seen in the SNF at [facility] today for evaluation of irritated eyes her chronic medical conditions.  Problem List Items Addressed This Visit   Anxiety state, unspecified     Manageable with Celexa 5mg  and Ativan 0.5mg  qam              Barrett's esophagus     Stable on Omeprazole 20mg  daily.     Osteoarthrosis, unspecified whether generalized or localized, unspecified site     Tylenol 650mg  bid is adequate.     Unspecified constipation     Stable on MiraLax daily.           Unspecified essential hypertension     Controlled and continue with Lisinopril 10mg               Xerophthalmia - Primary     On and off rubbing, mild injected eyes sometimes which is usually resolved without intervention--no conjunctival erythema or discharge seen today--will try artifical tears 1gtt tid OU-observe.        Review of Systems:  Review of Systems  Constitutional: Negative for fever and weight loss.  HENT: Negative for hearing loss, congestion and neck pain.   Eyes: Negative.        Dry eyes.   Respiratory: Negative for cough, sputum production and wheezing.   Cardiovascular: Negative for chest pain, orthopnea and leg swelling.  Gastrointestinal: Negative for nausea, vomiting, abdominal pain, diarrhea and constipation.  Genitourinary: Positive for frequency (incontinent of bladder). Negative for urgency and flank pain.  Musculoskeletal: Positive for falls (if not supervised). Negative for back pain and joint pain.  Skin: Negative for itching and rash.  Neurological: Negative for tremors, speech change, focal weakness, seizures, loss of  consciousness and weakness.  Endo/Heme/Allergies: Negative for polydipsia. Does not bruise/bleed easily.  Psychiatric/Behavioral: Positive for memory loss. Negative for depression and hallucinations. The patient is nervous/anxious (more freqeunt restlessness). The patient does not have insomnia.      Past Medical History  Diagnosis Date  . Malignant neoplasm of colon, unspecified site   . Anxiety state, unspecified   . Alzheimer's disease   . Unspecified essential hypertension   . Chronic airway obstruction, not elsewhere classified   . Barrett's esophagus   . Osteoarthrosis, unspecified whether generalized or localized, unspecified site   . Pain in joint, shoulder region   . Osteoporosis, unspecified   . Shortness of breath   . Flatulence, eructation, and gas pain   . Urinary frequency   . Hyperglycemia   . Pressure ulcer, heel(707.07)   . Other specified disease of white blood cells 02/24/2013  . Acute conjunctivitis, unspecified 02/24/2013  . Pneumonia, organism unspecified 02/24/2013  . Hypotension, unspecified 02/21/2013  . Acute bronchitis 02/21/2013  . Hemorrhage of gastrointestinal tract, unspecified 02/21/2013  . Altered mental status 02/21/2013  . Contusion of face, scalp, and neck except eye(s) 06/13/2012  . Unspecified fall 06/13/2012  . Hyposmolality and/or hyponatremia 05/17/2012  . Joint pain of lower extremity 04/15/2012  . Acute upper respiratory infections of unspecified site 04/24/2011  . Hordeolum externum 03/31/2011  . Contact dermatitis  and other eczema, due to unspecified cause 09/26/2010  . Candidiasis of other urogenital sites 07/15/2010  . Rash and other nonspecific skin eruption 07/15/2010  . Unspecified vitamin D deficiency 05/07/2010  . Altered mental status 04/11/2010  . Closed fracture of distal end of ulna (alone) 04/12  . Other specified disease of nail 08/16/2009  . Disorders of bursae and tendons in shoulder region, unspecified 06/21/2009   . Pain in joint, pelvic region and thigh 04/12/2009  . Anemia, unspecified 04/05/2009  . Hip joint replacement by other means 03/23/2009  . Unspecified constipation 10/09/2008  . Other malaise and fatigue 11/09/2006  . Aortic aneurysm of unspecified site without mention of rupture 07/28/2005  . Osteoarthrosis, unspecified whether generalized or localized, unspecified site 07/28/2005  . Pain in joint, shoulder region 07/28/2005  . Osteoporosis, unspecified 07/28/2005  . Other emphysema 07/29/1999  . Raynaud's syndrome 07/28/1998   Past Surgical History  Procedure Laterality Date  . Dilation and curettage of uterus    . Colon surgery  2001    partial colon resecton for cancer  . Cataract extraction w/ intraocular lens  implant, bilateral  2004  . Total hip arthroplasty Left 2010   Social History:   reports that she has never smoked. She does not have any smokeless tobacco history on file. She reports that she does not drink alcohol. Her drug history is not on file.  Family History  Problem Relation Age of Onset  . Stroke Mother     Medications: Patient's Medications  New Prescriptions   No medications on file  Previous Medications   ACETAMINOPHEN (TYLENOL) 325 MG TABLET    Take 650 mg by mouth 2 (two) times daily. Take 2 tablets every 6 hours as needed .   CALCIUM-VITAMIN D-VITAMIN K (VIACTIV) 500-500-40 MG-UNT-MCG CHEW    Chew 1 tablet by mouth daily.   CITALOPRAM (CELEXA) 10 MG TABLET    Take 5 mg by mouth daily. Take /12 daily to help nerves and depression.   ERGOCALCIFEROL (VITAMIN D2) 50000 UNITS CAPSULE    Take 50,000 Units by mouth once a week.   LISINOPRIL (PRINIVIL,ZESTRIL) 10 MG TABLET    Take 10 mg by mouth daily.   LORAZEPAM (ATIVAN) 0.5 MG TABLET    Take 0.5 mg by mouth See admin instructions. TAke daily in the morning and twice as needed for anxiety throughout the day   OMEPRAZOLE (PRILOSEC) 20 MG CAPSULE    Take 20 mg by mouth 2 (two) times daily.     POLYETHYLENE GLYCOL (MIRALAX / GLYCOLAX) PACKET    Take 17 g by mouth daily.   SUCRALFATE (CARAFATE) 1 GM/10ML SUSPENSION    Take 1 g by mouth 2 (two) times daily.   Modified Medications   No medications on file  Discontinued Medications   No medications on file     Physical Exam: .phy Filed Vitals:   09/11/13 1457  BP: 104/72  Pulse: 68  Temp: 97.6 F (36.4 C)  TempSrc: Tympanic  Resp: 16      Labs reviewed: Basic Metabolic Panel:  Recent Labs  14/78/29  NA 135*  K 4.2  BUN 23*  CREATININE 0.8   Liver Function Tests:  Recent Labs  02/21/13  AST 15  ALT 10  ALKPHOS 99   CBC:  Recent Labs  02/21/13  WBC 14.0  HGB 14.2  HCT 41  PLT 308   Assessment/Plan Xerophthalmia On and off rubbing, mild injected eyes sometimes which is usually resolved without intervention--no  conjunctival erythema or discharge seen today--will try artifical tears 1gtt tid OU-observe.   Unspecified essential hypertension Controlled and continue with Lisinopril 10mg             Unspecified constipation Stable on MiraLax daily.         Osteoarthrosis, unspecified whether generalized or localized, unspecified site Tylenol 650mg  bid is adequate.   Barrett's esophagus Stable on Omeprazole 20mg  daily.   Anxiety state, unspecified Manageable with Celexa 5mg  and Ativan 0.5mg  qam              Family/ Staff Communication: observe the patient.   Goals of Care: SNF  Labs/tests ordered: none    HPI Review of Systems  Constitutional: Negative for fever and weight loss.  HENT: Negative for hearing loss, congestion and neck pain.   Eyes: Negative.        Dry eyes.   Respiratory: Negative for cough, sputum production and wheezing.   Cardiovascular: Negative for chest pain, orthopnea and leg swelling.  Gastrointestinal: Negative for nausea, vomiting, abdominal pain, diarrhea and constipation.  Endocrine: Negative for polydipsia.  Genitourinary:  Positive for frequency (incontinent of bladder). Negative for urgency and flank pain.  Musculoskeletal: Positive for falls (if not supervised). Negative for back pain and joint pain.  Skin: Negative for itching and rash.  Neurological: Negative for tremors, speech change, focal weakness, seizures, loss of consciousness and weakness.  Hematological: Does not bruise/bleed easily.  Psychiatric/Behavioral: Positive for memory loss. Negative for depression and hallucinations. The patient is nervous/anxious (more freqeunt restlessness). The patient does not have insomnia.        Objective:   Physical Exam  Constitutional: She appears well-developed and well-nourished.  HENT:  Head: Normocephalic and atraumatic.  Eyes: Conjunctivae and EOM are normal. Pupils are equal, round, and reactive to light. Right eye exhibits no discharge. Left eye exhibits no discharge. No scleral icterus.  Neck: Normal range of motion. Neck supple. No JVD present. No thyromegaly present.  Cardiovascular: Normal rate, regular rhythm and normal heart sounds.   No murmur heard. Pulmonary/Chest: Effort normal. She has no wheezes. She has rales (bibasilar dry rales appreciated).  Abdominal: Soft. Bowel sounds are normal. There is no tenderness.  Musculoskeletal: Normal range of motion. She exhibits no edema and no tenderness.  Neurological: She is alert. No cranial nerve deficit. She exhibits normal muscle tone. Coordination normal.  Skin: Skin is warm and dry. No rash noted.  Psychiatric: Her mood appears anxious. Her affect is labile. Her affect is not angry. Her speech is tangential. Her speech is not slurred. She is agitated (when assisted with peronal care) and combative (when she doesn't understad what you want her to do). She is not aggressive, not hyperactive and not withdrawn. Thought content is not paranoid and not delusional. Cognition and memory are impaired. She expresses impulsivity and inappropriate judgment. She  does not exhibit a depressed mood. She exhibits abnormal recent memory and abnormal remote memory.       Assessment:         Plan:

## 2013-09-11 NOTE — Assessment & Plan Note (Signed)
Stable on Omeprazole 20 mg daily.

## 2013-09-11 NOTE — Assessment & Plan Note (Signed)
Manageable with Celexa 5mg  and Ativan 0.5mg  qam

## 2013-09-11 NOTE — Assessment & Plan Note (Signed)
On and off rubbing, mild injected eyes sometimes which is usually resolved without intervention--no conjunctival erythema or discharge seen today--will try artifical tears 1gtt tid OU-observe.

## 2013-09-11 NOTE — Assessment & Plan Note (Signed)
Controlled and continue with Lisinopril 10mg  

## 2013-09-11 NOTE — Assessment & Plan Note (Signed)
Stable on MiraLax daily.  

## 2013-10-09 ENCOUNTER — Non-Acute Institutional Stay (SKILLED_NURSING_FACILITY): Payer: Medicare Other | Admitting: Nurse Practitioner

## 2013-10-09 ENCOUNTER — Encounter: Payer: Self-pay | Admitting: Nurse Practitioner

## 2013-10-09 DIAGNOSIS — F411 Generalized anxiety disorder: Secondary | ICD-10-CM

## 2013-10-09 DIAGNOSIS — K5900 Constipation, unspecified: Secondary | ICD-10-CM

## 2013-10-09 DIAGNOSIS — K2270 Barrett's esophagus without dysplasia: Secondary | ICD-10-CM

## 2013-10-09 DIAGNOSIS — J449 Chronic obstructive pulmonary disease, unspecified: Secondary | ICD-10-CM

## 2013-10-09 DIAGNOSIS — M1990 Unspecified osteoarthritis, unspecified site: Secondary | ICD-10-CM

## 2013-10-09 DIAGNOSIS — F0280 Dementia in other diseases classified elsewhere without behavioral disturbance: Secondary | ICD-10-CM

## 2013-10-09 DIAGNOSIS — I10 Essential (primary) hypertension: Secondary | ICD-10-CM

## 2013-10-09 NOTE — Assessment & Plan Note (Signed)
Controlled and continue with Lisinopril 10mg  

## 2013-10-09 NOTE — Assessment & Plan Note (Signed)
Manageable with Celexa 5mg and reduced Ativan to 0.25mg qam since 10/05/13--no change in behaviors seen               

## 2013-10-09 NOTE — Progress Notes (Signed)
Patient ID: [name], female   DOB: November 21, 1917, 79 y.o.   MRN: [id number]  Code Status: DNR  Allergies  Allergen Reactions  . Morphine And Related   . Oxytrol [Oxybutynin]     Chief Complaint  Patient presents with  . Medical Managment of Chronic Issues    HPI: Patient is a 79 y.o. female seen in the SNF at [facility] today for evaluation of irritated eyes her chronic medical conditions.  Problem List Items Addressed This Visit   Alzheimer's disease - Primary     Continue supportive care in SNF and off all memory preserving medications              Anxiety state, unspecified     Manageable with Celexa 5mg  and reduced Ativan to 0.25mg  qam since 10/05/13--no change in behaviors seen                Barrett's esophagus     Stable on Omeprazole 20mg  daily.       Chronic airway obstruction, not elsewhere classified     stable    Essential hypertension, benign     Controlled and continue with Lisinopril 10mg               Osteoarthrosis, unspecified whether generalized or localized, unspecified site     Tylenol 650mg  bid is adequate.       Unspecified constipation     Stable on MiraLax daily.             Unspecified essential hypertension     Controlled and continue with Lisinopril 10mg                    Review of Systems:  Review of Systems  Constitutional: Negative for fever and weight loss.  HENT: Negative for congestion and hearing loss.   Eyes: Negative.        Dry eyes.   Respiratory: Negative for cough, sputum production and wheezing.   Cardiovascular: Negative for chest pain, orthopnea and leg swelling.  Gastrointestinal: Negative for nausea, vomiting, abdominal pain, diarrhea and constipation.  Genitourinary: Positive for frequency (incontinent of bladder). Negative for urgency and flank pain.  Musculoskeletal: Positive for falls (if not supervised). Negative for back pain, joint pain and neck  pain.  Skin: Negative for itching and rash.  Neurological: Negative for tremors, speech change, focal weakness, seizures, loss of consciousness and weakness.  Endo/Heme/Allergies: Negative for polydipsia. Does not bruise/bleed easily.  Psychiatric/Behavioral: Positive for memory loss. Negative for depression and hallucinations. The patient is nervous/anxious (more freqeunt restlessness). The patient does not have insomnia.      Past Medical History  Diagnosis Date  . Malignant neoplasm of colon, unspecified site   . Anxiety state, unspecified   . Alzheimer's disease   . Unspecified essential hypertension   . Chronic airway obstruction, not elsewhere classified   . Barrett's esophagus   . Osteoarthrosis, unspecified whether generalized or localized, unspecified site   . Pain in joint, shoulder region   . Osteoporosis, unspecified   . Shortness of breath   . Flatulence, eructation, and gas pain   . Urinary frequency   . Hyperglycemia   . Pressure ulcer, heel(707.07)   . Other specified disease of white blood cells 02/24/2013  . Acute conjunctivitis, unspecified 02/24/2013  . Pneumonia, organism unspecified 02/24/2013  . Hypotension, unspecified 02/21/2013  . Acute bronchitis 02/21/2013  . Hemorrhage of gastrointestinal tract, unspecified 02/21/2013  . Altered mental status 02/21/2013  . Contusion  of face, scalp, and neck except eye(s) 06/13/2012  . Unspecified fall 06/13/2012  . Hyposmolality and/or hyponatremia 05/17/2012  . Joint pain of lower extremity 04/15/2012  . Acute upper respiratory infections of unspecified site 04/24/2011  . Hordeolum externum 03/31/2011  . Contact dermatitis and other eczema, due to unspecified cause 09/26/2010  . Candidiasis of other urogenital sites 07/15/2010  . Rash and other nonspecific skin eruption 07/15/2010  . Unspecified vitamin D deficiency 05/07/2010  . Altered mental status 04/11/2010  . Closed fracture of distal end of ulna (alone)  04/12  . Other specified disease of nail 08/16/2009  . Disorders of bursae and tendons in shoulder region, unspecified 06/21/2009  . Pain in joint, pelvic region and thigh 04/12/2009  . Anemia, unspecified 04/05/2009  . Hip joint replacement by other means 03/23/2009  . Unspecified constipation 10/09/2008  . Other malaise and fatigue 11/09/2006  . Aortic aneurysm of unspecified site without mention of rupture 07/28/2005  . Osteoarthrosis, unspecified whether generalized or localized, unspecified site 07/28/2005  . Pain in joint, shoulder region 07/28/2005  . Osteoporosis, unspecified 07/28/2005  . Other emphysema 07/29/1999  . Raynaud's syndrome 07/28/1998   Past Surgical History  Procedure Laterality Date  . Dilation and curettage of uterus    . Colon surgery  2001    partial colon resecton for cancer  . Cataract extraction w/ intraocular lens  implant, bilateral  2004  . Total hip arthroplasty Left 2010   Social History:   reports that she has never smoked. She does not have any smokeless tobacco history on file. She reports that she does not drink alcohol. Her drug history is not on file.  Family History  Problem Relation Age of Onset  . Stroke Mother     Medications: Patient's Medications  New Prescriptions   No medications on file  Previous Medications   ACETAMINOPHEN (TYLENOL) 325 MG TABLET    Take 650 mg by mouth 2 (two) times daily. Take 2 tablets every 6 hours as needed .   CALCIUM-VITAMIN D-VITAMIN K (VIACTIV) 500-500-40 MG-UNT-MCG CHEW    Chew 1 tablet by mouth daily.   CITALOPRAM (CELEXA) 10 MG TABLET    Take 5 mg by mouth daily. Take /12 daily to help nerves and depression.   ERGOCALCIFEROL (VITAMIN D2) 50000 UNITS CAPSULE    Take 50,000 Units by mouth once a week.   LISINOPRIL (PRINIVIL,ZESTRIL) 10 MG TABLET    Take 10 mg by mouth daily.   LORAZEPAM (ATIVAN) 0.5 MG TABLET    Take 0.5 mg by mouth See admin instructions. TAke daily in the morning and twice as  needed for anxiety throughout the day   OMEPRAZOLE (PRILOSEC) 20 MG CAPSULE    Take 20 mg by mouth 2 (two) times daily.    POLYETHYLENE GLYCOL (MIRALAX / GLYCOLAX) PACKET    Take 17 g by mouth daily.   SUCRALFATE (CARAFATE) 1 GM/10ML SUSPENSION    Take 1 g by mouth 2 (two) times daily.   Modified Medications   No medications on file  Discontinued Medications   No medications on file     Physical Exam: .phy Filed Vitals:   10/09/13 1321  BP: 115/66  Pulse: 68  Temp: 97.6 F (36.4 C)  TempSrc: Tympanic  Resp: 16      Labs reviewed: Basic Metabolic Panel:  Recent Labs  16/10/96 08/01/13  NA 135* 140  K 4.2 3.8  BUN 23* 25*  CREATININE 0.8 0.9  TSH  --  0.80  Liver Function Tests:  Recent Labs  02/21/13 08/01/13  AST 15 13  ALT 10 9  ALKPHOS 99 97   CBC:  Recent Labs  02/21/13 08/01/13  WBC 14.0 7.8  HGB 14.2 12.5  HCT 41 37  PLT 308 326   Assessment/Plan Alzheimer's disease Continue supportive care in SNF and off all memory preserving medications            Anxiety state, unspecified Manageable with Celexa 5mg  and reduced Ativan to 0.25mg  qam since 10/05/13--no change in behaviors seen              Barrett's esophagus Stable on Omeprazole 20mg  daily.     Chronic airway obstruction, not elsewhere classified stable  Essential hypertension, benign Controlled and continue with Lisinopril 10mg             Osteoarthrosis, unspecified whether generalized or localized, unspecified site Tylenol 650mg  bid is adequate.     Unspecified constipation Stable on MiraLax daily.           Unspecified essential hypertension Controlled and continue with Lisinopril 10mg                 Family/ Staff Communication: observe the patient.   Goals of Care: SNF  Labs/tests ordered: none    HPI Review of Systems  Constitutional: Negative for fever and weight loss.  HENT: Negative for congestion and  hearing loss.   Eyes: Negative.        Dry eyes.   Respiratory: Negative for cough, sputum production and wheezing.   Cardiovascular: Negative for chest pain, orthopnea and leg swelling.  Gastrointestinal: Negative for nausea, vomiting, abdominal pain, diarrhea and constipation.  Endocrine: Negative for polydipsia.  Genitourinary: Positive for frequency (incontinent of bladder). Negative for urgency and flank pain.  Musculoskeletal: Positive for falls (if not supervised). Negative for back pain, joint pain and neck pain.  Skin: Negative for itching and rash.  Neurological: Negative for tremors, speech change, focal weakness, seizures, loss of consciousness and weakness.  Hematological: Does not bruise/bleed easily.  Psychiatric/Behavioral: Positive for memory loss. Negative for depression and hallucinations. The patient is nervous/anxious (more freqeunt restlessness). The patient does not have insomnia.        Objective:   Physical Exam  Constitutional: She appears well-developed and well-nourished.  HENT:  Head: Normocephalic and atraumatic.  Eyes: Conjunctivae and EOM are normal. Pupils are equal, round, and reactive to light. Right eye exhibits no discharge. Left eye exhibits no discharge. No scleral icterus.  Neck: Normal range of motion. Neck supple. No JVD present. No thyromegaly present.  Cardiovascular: Normal rate, regular rhythm and normal heart sounds.   No murmur heard. Pulmonary/Chest: Effort normal. She has no wheezes. She has rales (bibasilar dry rales appreciated).  Abdominal: Soft. Bowel sounds are normal. There is no tenderness.  Musculoskeletal: Normal range of motion. She exhibits no edema and no tenderness.  Neurological: She is alert. No cranial nerve deficit. She exhibits normal muscle tone. Coordination normal.  Skin: Skin is warm and dry. No rash noted.  Psychiatric: Her mood appears anxious. Her affect is labile. Her affect is not angry. Her speech is  tangential. Her speech is not slurred. She is agitated (when assisted with peronal care) and combative (when she doesn't understad what you want her to do). She is not aggressive, not hyperactive and not withdrawn. Thought content is not paranoid and not delusional. Cognition and memory are impaired. She expresses impulsivity and inappropriate judgment. She does not exhibit a depressed  mood. She exhibits abnormal recent memory and abnormal remote memory.       Assessment:         Plan:

## 2013-10-09 NOTE — Assessment & Plan Note (Signed)
Continue supportive care in SNF and off all memory preserving medications   

## 2013-10-09 NOTE — Assessment & Plan Note (Signed)
stable °

## 2013-10-09 NOTE — Assessment & Plan Note (Signed)
Stable on Omeprazole 20 mg daily.

## 2013-10-09 NOTE — Assessment & Plan Note (Signed)
Tylenol 650mg bid is adequate.  

## 2013-10-09 NOTE — Assessment & Plan Note (Signed)
Stable on MiraLax daily.  

## 2013-11-08 ENCOUNTER — Encounter: Payer: Self-pay | Admitting: Nurse Practitioner

## 2013-11-08 ENCOUNTER — Non-Acute Institutional Stay (SKILLED_NURSING_FACILITY): Payer: Medicare Other | Admitting: Nurse Practitioner

## 2013-11-08 DIAGNOSIS — I10 Essential (primary) hypertension: Secondary | ICD-10-CM

## 2013-11-08 DIAGNOSIS — K219 Gastro-esophageal reflux disease without esophagitis: Secondary | ICD-10-CM

## 2013-11-08 DIAGNOSIS — F411 Generalized anxiety disorder: Secondary | ICD-10-CM

## 2013-11-08 DIAGNOSIS — F0280 Dementia in other diseases classified elsewhere without behavioral disturbance: Secondary | ICD-10-CM

## 2013-11-08 DIAGNOSIS — M1990 Unspecified osteoarthritis, unspecified site: Secondary | ICD-10-CM

## 2013-11-08 DIAGNOSIS — K5900 Constipation, unspecified: Secondary | ICD-10-CM

## 2013-11-08 DIAGNOSIS — M810 Age-related osteoporosis without current pathological fracture: Secondary | ICD-10-CM

## 2013-11-08 NOTE — Assessment & Plan Note (Signed)
Takes Vit D 50,000 u monthly and Ca daily. No longer ambulatory and limited weight bearing activities. Risk for fall is low. R>B for osteoporosis medications.

## 2013-11-08 NOTE — Assessment & Plan Note (Signed)
Stable on Omeprazole 20 mg daily.

## 2013-11-08 NOTE — Assessment & Plan Note (Signed)
Continue supportive care in SNF and off all memory preserving medications   

## 2013-11-08 NOTE — Assessment & Plan Note (Signed)
Tylenol 650mg bid is adequate.  

## 2013-11-08 NOTE — Assessment & Plan Note (Signed)
Stable on MiraLax daily.  

## 2013-11-08 NOTE — Assessment & Plan Note (Signed)
Manageable with Celexa 5mg  and reduced Ativan to 0.25mg  qam since 10/05/13--no change in behaviors seen

## 2013-11-08 NOTE — Progress Notes (Signed)
Patient ID: [name], female   DOB: [birth year], 79 y.o.   MRN: [id number]  Code Status: DNR  Allergies  Allergen Reactions  . Morphine And Related   . Oxytrol [Oxybutynin]     Chief Complaint  Patient presents with  . Medical Managment of Chronic Issues  . Dementia    HPI: Patient is a 79 y.o. female seen in the SNF at [facility] today for evaluation of irritated eyes her chronic medical conditions.  Problem List Items Addressed This Visit   Alzheimer's disease     Continue supportive care in SNF and off all memory preserving medications                Anxiety state, unspecified     Manageable with Celexa 5mg  and reduced Ativan to 0.25mg  qam since 10/05/13--no change in behaviors seen                  GERD (gastroesophageal reflux disease)     Stable on Omeprazole 20mg  daily.         Osteoarthrosis, unspecified whether generalized or localized, unspecified site     Tylenol 650mg  bid is adequate.         Osteoporosis, unspecified - Primary     Takes Vit D 50,000 u monthly and Ca daily. No longer ambulatory and limited weight bearing activities. Risk for fall is low. R>B for osteoporosis medications.     Unspecified constipation     Stable on MiraLax daily.               Unspecified essential hypertension     Controlled and continue with Lisinopril 10mg                      Review of Systems:  Review of Systems  Constitutional: Negative for fever and weight loss.  HENT: Negative for congestion and hearing loss.   Eyes: Negative.        Dry eyes.   Respiratory: Negative for cough, sputum production and wheezing.   Cardiovascular: Negative for chest pain, orthopnea and leg swelling.  Gastrointestinal: Negative for nausea, vomiting, abdominal pain, diarrhea and constipation.  Genitourinary: Positive for frequency (incontinent of bladder). Negative for urgency and flank pain.  Musculoskeletal: Positive  for falls (if not supervised). Negative for back pain, joint pain and neck pain.  Skin: Negative for itching and rash.  Neurological: Negative for tremors, speech change, focal weakness, seizures, loss of consciousness and weakness.  Endo/Heme/Allergies: Negative for polydipsia. Does not bruise/bleed easily.  Psychiatric/Behavioral: Positive for memory loss. Negative for depression and hallucinations. The patient is nervous/anxious (more freqeunt restlessness). The patient does not have insomnia.      Past Medical History  Diagnosis Date  . Malignant neoplasm of colon, unspecified site   . Anxiety state, unspecified   . Alzheimer's disease   . Unspecified essential hypertension   . Chronic airway obstruction, not elsewhere classified   . Barrett's esophagus   . Osteoarthrosis, unspecified whether generalized or localized, unspecified site   . Pain in joint, shoulder region   . Osteoporosis, unspecified   . Shortness of breath   . Flatulence, eructation, and gas pain   . Urinary frequency   . Hyperglycemia   . Pressure ulcer, heel(707.07)   . Other specified disease of white blood cells 02/24/2013  . Acute conjunctivitis, unspecified 02/24/2013  . Pneumonia, organism unspecified 02/24/2013  . Hypotension, unspecified 02/21/2013  . Acute bronchitis 02/21/2013  . Hemorrhage of  gastrointestinal tract, unspecified 02/21/2013  . Altered mental status 02/21/2013  . Contusion of face, scalp, and neck except eye(s) 06/13/2012  . Unspecified fall 06/13/2012  . Hyposmolality and/or hyponatremia 05/17/2012  . Joint pain of lower extremity 04/15/2012  . Acute upper respiratory infections of unspecified site 04/24/2011  . Hordeolum externum 03/31/2011  . Contact dermatitis and other eczema, due to unspecified cause 09/26/2010  . Candidiasis of other urogenital sites 07/15/2010  . Rash and other nonspecific skin eruption 07/15/2010  . Unspecified vitamin D deficiency 05/07/2010  . Altered  mental status 04/11/2010  . Closed fracture of distal end of ulna (alone) 04/12  . Other specified disease of nail 08/16/2009  . Disorders of bursae and tendons in shoulder region, unspecified 06/21/2009  . Pain in joint, pelvic region and thigh 04/12/2009  . Anemia, unspecified 04/05/2009  . Hip joint replacement by other means 03/23/2009  . Unspecified constipation 10/09/2008  . Other malaise and fatigue 11/09/2006  . Aortic aneurysm of unspecified site without mention of rupture 07/28/2005  . Osteoarthrosis, unspecified whether generalized or localized, unspecified site 07/28/2005  . Pain in joint, shoulder region 07/28/2005  . Osteoporosis, unspecified 07/28/2005  . Other emphysema 07/29/1999  . Raynaud's syndrome 07/28/1998   Past Surgical History  Procedure Laterality Date  . Dilation and curettage of uterus    . Colon surgery  2001    partial colon resecton for cancer  . Cataract extraction w/ intraocular lens  implant, bilateral  2004  . Total hip arthroplasty Left 2010   Social History:   reports that she has never smoked. She does not have any smokeless tobacco history on file. She reports that she does not drink alcohol. Her drug history is not on file.  Family History  Problem Relation Age of Onset  . Stroke Mother     Medications: Patient's Medications  New Prescriptions   No medications on file  Previous Medications   ACETAMINOPHEN (TYLENOL) 325 MG TABLET    Take 650 mg by mouth 2 (two) times daily. Take 2 tablets every 6 hours as needed .   CALCIUM-VITAMIN D-VITAMIN K (VIACTIV) 500-500-40 MG-UNT-MCG CHEW    Chew 1 tablet by mouth daily.   CITALOPRAM (CELEXA) 10 MG TABLET    Take 5 mg by mouth daily. Take /12 daily to help nerves and depression.   ERGOCALCIFEROL (VITAMIN D2) 50000 UNITS CAPSULE    Take 50,000 Units by mouth once a week.   LISINOPRIL (PRINIVIL,ZESTRIL) 10 MG TABLET    Take 10 mg by mouth daily.   LORAZEPAM (ATIVAN) 0.5 MG TABLET    Take 0.5 mg  by mouth See admin instructions. TAke daily in the morning and twice as needed for anxiety throughout the day   OMEPRAZOLE (PRILOSEC) 20 MG CAPSULE    Take 20 mg by mouth 2 (two) times daily.    POLYETHYLENE GLYCOL (MIRALAX / GLYCOLAX) PACKET    Take 17 g by mouth daily.   SUCRALFATE (CARAFATE) 1 GM/10ML SUSPENSION    Take 1 g by mouth 2 (two) times daily.   Modified Medications   No medications on file  Discontinued Medications   No medications on file     Physical Exam: .phy Filed Vitals:   11/08/13 1256  BP: 110/70  Pulse: 72  Temp: 97.2 F (36.2 C)  TempSrc: Tympanic  Resp: 18      Labs reviewed: Basic Metabolic Panel:  Recent Labs  16/10/96 08/01/13  NA 135* 140  K 4.2 3.8  BUN 23* 25*  CREATININE 0.8 0.9  TSH  --  0.80   Liver Function Tests:  Recent Labs  02/21/13 08/01/13  AST 15 13  ALT 10 9  ALKPHOS 99 97   CBC:  Recent Labs  02/21/13 08/01/13  WBC 14.0 7.8  HGB 14.2 12.5  HCT 41 37  PLT 308 326   Assessment/Plan Osteoporosis, unspecified Takes Vit D 50,000 u monthly and Ca daily. No longer ambulatory and limited weight bearing activities. Risk for fall is low. R>B for osteoporosis medications.   GERD (gastroesophageal reflux disease) Stable on Omeprazole 20mg  daily.       Anxiety state, unspecified Manageable with Celexa 5mg  and reduced Ativan to 0.25mg  qam since 10/05/13--no change in behaviors seen                Alzheimer's disease Continue supportive care in SNF and off all memory preserving medications              Unspecified essential hypertension Controlled and continue with Lisinopril 10mg                 Unspecified constipation Stable on MiraLax daily.             Osteoarthrosis, unspecified whether generalized or localized, unspecified site Tylenol 650mg  bid is adequate.         Family/ Staff Communication: observe the patient.   Goals of Care:  SNF  Labs/tests ordered: none    HPI Review of Systems  Constitutional: Negative for fever and weight loss.  HENT: Negative for congestion and hearing loss.   Eyes: Negative.        Dry eyes.   Respiratory: Negative for cough, sputum production and wheezing.   Cardiovascular: Negative for chest pain, orthopnea and leg swelling.  Gastrointestinal: Negative for nausea, vomiting, abdominal pain, diarrhea and constipation.  Endocrine: Negative for polydipsia.  Genitourinary: Positive for frequency (incontinent of bladder). Negative for urgency and flank pain.  Musculoskeletal: Positive for falls (if not supervised). Negative for back pain, joint pain and neck pain.  Skin: Negative for itching and rash.  Neurological: Negative for tremors, speech change, focal weakness, seizures, loss of consciousness and weakness.  Hematological: Does not bruise/bleed easily.  Psychiatric/Behavioral: Positive for memory loss. Negative for depression and hallucinations. The patient is nervous/anxious (more freqeunt restlessness). The patient does not have insomnia.        Objective:   Physical Exam  Constitutional: She appears well-developed and well-nourished.  HENT:  Head: Normocephalic and atraumatic.  Eyes: Conjunctivae and EOM are normal. Pupils are equal, round, and reactive to light. Right eye exhibits no discharge. Left eye exhibits no discharge. No scleral icterus.  Neck: Normal range of motion. Neck supple. No JVD present. No thyromegaly present.  Cardiovascular: Normal rate, regular rhythm and normal heart sounds.   No murmur heard. Pulmonary/Chest: Effort normal. She has no wheezes. She has rales (bibasilar dry rales appreciated).  Abdominal: Soft. Bowel sounds are normal. There is no tenderness.  Musculoskeletal: Normal range of motion. She exhibits no edema and no tenderness.  Neurological: She is alert. No cranial nerve deficit. She exhibits normal muscle tone. Coordination normal.   Skin: Skin is warm and dry. No rash noted.  Psychiatric: Her mood appears anxious. Her affect is labile. Her affect is not angry. Her speech is tangential. Her speech is not slurred. She is agitated (when assisted with peronal care) and combative (when she doesn't understad what you want her to do). She is not  aggressive, not hyperactive and not withdrawn. Thought content is not paranoid and not delusional. Cognition and memory are impaired. She expresses impulsivity and inappropriate judgment. She does not exhibit a depressed mood. She exhibits abnormal recent memory and abnormal remote memory.       Assessment:         Plan:

## 2013-11-08 NOTE — Assessment & Plan Note (Signed)
Controlled and continue with Lisinopril 10mg  

## 2013-11-13 ENCOUNTER — Non-Acute Institutional Stay (SKILLED_NURSING_FACILITY): Payer: Medicare Other | Admitting: Nurse Practitioner

## 2013-11-13 ENCOUNTER — Encounter: Payer: Self-pay | Admitting: Nurse Practitioner

## 2013-11-13 DIAGNOSIS — I10 Essential (primary) hypertension: Secondary | ICD-10-CM

## 2013-11-13 DIAGNOSIS — M1990 Unspecified osteoarthritis, unspecified site: Secondary | ICD-10-CM

## 2013-11-13 DIAGNOSIS — F411 Generalized anxiety disorder: Secondary | ICD-10-CM

## 2013-11-13 DIAGNOSIS — K219 Gastro-esophageal reflux disease without esophagitis: Secondary | ICD-10-CM

## 2013-11-13 NOTE — Assessment & Plan Note (Signed)
Stable on Omeprazole 20 mg daily.

## 2013-11-13 NOTE — Assessment & Plan Note (Signed)
Tylenol 650mg bid is adequate.  

## 2013-11-13 NOTE — Assessment & Plan Note (Signed)
Manageable with Celexa 5mg  and reduced Ativan to 0.25mg  qam since 10/05/13--resumed Ativan 0.5mg  po qam due to increased anxiety as evidenced in chewing her pillows and clenching her teeth constantly--better sine resumed Ativan 0.5mg  11/10/13--even if no behaviors at dental appointment this am.

## 2013-11-13 NOTE — Progress Notes (Signed)
Patient ID: [name], female   DOB: 05-Jun-1917, 79 y.o.   MRN: [id number]  Code Status: DNR  Allergies  Allergen Reactions  . Morphine And Related   . Oxytrol [Oxybutynin]     Chief Complaint  Patient presents with  . Medical Managment of Chronic Issues    anxiety  . Acute Visit  . Dementia    HPI: Patient is a 79 y.o. female seen in the SNF at [facility] today for evaluation of anxiety and  her chronic medical conditions.  Problem List Items Addressed This Visit   Anxiety state, unspecified - Primary     Manageable with Celexa 5mg  and reduced Ativan to 0.25mg  qam since 10/05/13--resumed Ativan 0.5mg  po qam due to increased anxiety as evidenced in chewing her pillows and clenching her teeth constantly--better sine resumed Ativan 0.5mg  11/10/13--even if no behaviors at dental appointment this am.                     Essential hypertension, benign     Controlled and continue with Lisinopril 10mg                     GERD (gastroesophageal reflux disease)     Stable on Omeprazole 20mg  daily.           Osteoarthrosis, unspecified whether generalized or localized, unspecified site     Tylenol 650mg  bid is adequate.              Review of Systems:  Review of Systems  Constitutional: Negative for fever and weight loss.  HENT: Negative for congestion and hearing loss.   Eyes: Negative.        Dry eyes.   Respiratory: Negative for cough, sputum production and wheezing.   Cardiovascular: Negative for chest pain, orthopnea and leg swelling.  Gastrointestinal: Negative for nausea, vomiting, abdominal pain, diarrhea and constipation.  Genitourinary: Positive for frequency (incontinent of bladder). Negative for urgency and flank pain.  Musculoskeletal: Positive for falls (if not supervised). Negative for back pain, joint pain and neck pain.  Skin: Negative for itching and rash.  Neurological: Negative for tremors, speech change,  focal weakness, seizures, loss of consciousness and weakness.  Endo/Heme/Allergies: Negative for polydipsia. Does not bruise/bleed easily.  Psychiatric/Behavioral: Positive for memory loss. Negative for depression and hallucinations. The patient is nervous/anxious (more freqeunt restlessness). The patient does not have insomnia.      Past Medical History  Diagnosis Date  . Malignant neoplasm of colon, unspecified site   . Anxiety state, unspecified   . Alzheimer's disease   . Unspecified essential hypertension   . Chronic airway obstruction, not elsewhere classified   . Barrett's esophagus   . Osteoarthrosis, unspecified whether generalized or localized, unspecified site   . Pain in joint, shoulder region   . Osteoporosis, unspecified   . Shortness of breath   . Flatulence, eructation, and gas pain   . Urinary frequency   . Hyperglycemia   . Pressure ulcer, heel(707.07)   . Other specified disease of white blood cells 02/24/2013  . Acute conjunctivitis, unspecified 02/24/2013  . Pneumonia, organism unspecified 02/24/2013  . Hypotension, unspecified 02/21/2013  . Acute bronchitis 02/21/2013  . Hemorrhage of gastrointestinal tract, unspecified 02/21/2013  . Altered mental status 02/21/2013  . Contusion of face, scalp, and neck except eye(s) 06/13/2012  . Unspecified fall 06/13/2012  . Hyposmolality and/or hyponatremia 05/17/2012  . Joint pain of lower extremity 04/15/2012  . Acute upper respiratory infections  of unspecified site 04/24/2011  . Hordeolum externum 03/31/2011  . Contact dermatitis and other eczema, due to unspecified cause 09/26/2010  . Candidiasis of other urogenital sites 07/15/2010  . Rash and other nonspecific skin eruption 07/15/2010  . Unspecified vitamin D deficiency 05/07/2010  . Altered mental status 04/11/2010  . Closed fracture of distal end of ulna (alone) 04/12  . Other specified disease of nail 08/16/2009  . Disorders of bursae and tendons in  shoulder region, unspecified 06/21/2009  . Pain in joint, pelvic region and thigh 04/12/2009  . Anemia, unspecified 04/05/2009  . Hip joint replacement by other means 03/23/2009  . Unspecified constipation 10/09/2008  . Other malaise and fatigue 11/09/2006  . Aortic aneurysm of unspecified site without mention of rupture 07/28/2005  . Osteoarthrosis, unspecified whether generalized or localized, unspecified site 07/28/2005  . Pain in joint, shoulder region 07/28/2005  . Osteoporosis, unspecified 07/28/2005  . Other emphysema 07/29/1999  . Raynaud's syndrome 07/28/1998   Past Surgical History  Procedure Laterality Date  . Dilation and curettage of uterus    . Colon surgery  2001    partial colon resecton for cancer  . Cataract extraction w/ intraocular lens  implant, bilateral  2004  . Total hip arthroplasty Left 2010   Social History:   reports that she has never smoked. She does not have any smokeless tobacco history on file. She reports that she does not drink alcohol. Her drug history is not on file.  Family History  Problem Relation Age of Onset  . Stroke Mother     Medications: Patient's Medications  New Prescriptions   No medications on file  Previous Medications   ACETAMINOPHEN (TYLENOL) 325 MG TABLET    Take 650 mg by mouth 2 (two) times daily. Take 2 tablets every 6 hours as needed .   CALCIUM-VITAMIN D-VITAMIN K (VIACTIV) 500-500-40 MG-UNT-MCG CHEW    Chew 1 tablet by mouth daily.   CITALOPRAM (CELEXA) 10 MG TABLET    Take 5 mg by mouth daily. Take /12 daily to help nerves and depression.   ERGOCALCIFEROL (VITAMIN D2) 50000 UNITS CAPSULE    Take 50,000 Units by mouth once a week.   LISINOPRIL (PRINIVIL,ZESTRIL) 10 MG TABLET    Take 10 mg by mouth daily.   LORAZEPAM (ATIVAN) 0.5 MG TABLET    Take 0.5 mg by mouth See admin instructions. TAke daily in the morning and twice as needed for anxiety throughout the day   OMEPRAZOLE (PRILOSEC) 20 MG CAPSULE    Take 20 mg by  mouth 2 (two) times daily.    POLYETHYLENE GLYCOL (MIRALAX / GLYCOLAX) PACKET    Take 17 g by mouth daily.   SUCRALFATE (CARAFATE) 1 GM/10ML SUSPENSION    Take 1 g by mouth 2 (two) times daily.   Modified Medications   No medications on file  Discontinued Medications   No medications on file     Physical Exam: .phy Filed Vitals:   11/13/13 1309  BP: 110/74  Pulse: 80  Temp: 97.6 F (36.4 C)  TempSrc: Tympanic  Resp: 16      Labs reviewed: Basic Metabolic Panel:  Recent Labs  16/10/96 08/01/13  NA 135* 140  K 4.2 3.8  BUN 23* 25*  CREATININE 0.8 0.9  TSH  --  0.80   Liver Function Tests:  Recent Labs  02/21/13 08/01/13  AST 15 13  ALT 10 9  ALKPHOS 99 97   CBC:  Recent Labs  02/21/13 08/01/13  WBC 14.0 7.8  HGB 14.2 12.5  HCT 41 37  PLT 308 326   Assessment/Plan Anxiety state, unspecified Manageable with Celexa 5mg  and reduced Ativan to 0.25mg  qam since 10/05/13--resumed Ativan 0.5mg  po qam due to increased anxiety as evidenced in chewing her pillows and clenching her teeth constantly--better sine resumed Ativan 0.5mg  11/10/13--even if no behaviors at dental appointment this am.                   GERD (gastroesophageal reflux disease) Stable on Omeprazole 20mg  daily.         Essential hypertension, benign Controlled and continue with Lisinopril 10mg                   Osteoarthrosis, unspecified whether generalized or localized, unspecified site Tylenol 650mg  bid is adequate.           Family/ Staff Communication: observe the patient.   Goals of Care: SNF  Labs/tests ordered: none    HPI Review of Systems  Constitutional: Negative for fever and weight loss.  HENT: Negative for congestion and hearing loss.   Eyes: Negative.        Dry eyes.   Respiratory: Negative for cough, sputum production and wheezing.   Cardiovascular: Negative for chest pain, orthopnea and leg swelling.  Gastrointestinal:  Negative for nausea, vomiting, abdominal pain, diarrhea and constipation.  Endocrine: Negative for polydipsia.  Genitourinary: Positive for frequency (incontinent of bladder). Negative for urgency and flank pain.  Musculoskeletal: Positive for falls (if not supervised). Negative for back pain, joint pain and neck pain.  Skin: Negative for itching and rash.  Neurological: Negative for tremors, speech change, focal weakness, seizures, loss of consciousness and weakness.  Hematological: Does not bruise/bleed easily.  Psychiatric/Behavioral: Positive for memory loss. Negative for depression and hallucinations. The patient is nervous/anxious (more freqeunt restlessness). The patient does not have insomnia.        Objective:   Physical Exam  Constitutional: She appears well-developed and well-nourished.  HENT:  Head: Normocephalic and atraumatic.  Eyes: Conjunctivae and EOM are normal. Pupils are equal, round, and reactive to light. Right eye exhibits no discharge. Left eye exhibits no discharge. No scleral icterus.  Neck: Normal range of motion. Neck supple. No JVD present. No thyromegaly present.  Cardiovascular: Normal rate, regular rhythm and normal heart sounds.   No murmur heard. Pulmonary/Chest: Effort normal. She has no wheezes. She has rales (bibasilar dry rales appreciated).  Abdominal: Soft. Bowel sounds are normal. There is no tenderness.  Musculoskeletal: Normal range of motion. She exhibits no edema and no tenderness.  Neurological: She is alert. No cranial nerve deficit. She exhibits normal muscle tone. Coordination normal.  Skin: Skin is warm and dry. No rash noted.  Psychiatric: Her mood appears anxious. Her affect is labile. Her affect is not angry. Her speech is tangential. Her speech is not slurred. She is agitated (when assisted with peronal care) and combative (when she doesn't understad what you want her to do). She is not aggressive, not hyperactive and not withdrawn.  Thought content is not paranoid and not delusional. Cognition and memory are impaired. She expresses impulsivity and inappropriate judgment. She does not exhibit a depressed mood. She exhibits abnormal recent memory and abnormal remote memory.       Assessment:         Plan:

## 2013-11-13 NOTE — Assessment & Plan Note (Signed)
Controlled and continue with Lisinopril 10mg  

## 2013-11-27 ENCOUNTER — Encounter: Payer: Self-pay | Admitting: Nurse Practitioner

## 2013-11-27 ENCOUNTER — Non-Acute Institutional Stay (SKILLED_NURSING_FACILITY): Payer: Medicare Other | Admitting: Nurse Practitioner

## 2013-11-27 DIAGNOSIS — L299 Pruritus, unspecified: Secondary | ICD-10-CM

## 2013-11-27 DIAGNOSIS — K5900 Constipation, unspecified: Secondary | ICD-10-CM

## 2013-11-27 DIAGNOSIS — M25519 Pain in unspecified shoulder: Secondary | ICD-10-CM

## 2013-11-27 DIAGNOSIS — F411 Generalized anxiety disorder: Secondary | ICD-10-CM

## 2013-11-27 DIAGNOSIS — F0390 Unspecified dementia without behavioral disturbance: Secondary | ICD-10-CM

## 2013-11-27 DIAGNOSIS — I10 Essential (primary) hypertension: Secondary | ICD-10-CM

## 2013-11-27 DIAGNOSIS — M25512 Pain in left shoulder: Secondary | ICD-10-CM

## 2013-11-27 DIAGNOSIS — K219 Gastro-esophageal reflux disease without esophagitis: Secondary | ICD-10-CM

## 2013-11-27 NOTE — Assessment & Plan Note (Addendum)
Itching and scratched marks BLE right lateral lower leg, nape rash--Claritin 10mg  daily for 4 weeks(recurrent and responded to Claritin in the past). Mycolog II cream bid to the right lateral lower leg rash bid for 2 weeks.

## 2013-11-27 NOTE — Assessment & Plan Note (Signed)
Continue supportive care in SNF and off all memory preserving medications   

## 2013-11-27 NOTE — Assessment & Plan Note (Signed)
Manageable with Celexa 5mg and Ativan 0.5mg qam/0.25mg bid prn.  

## 2013-11-27 NOTE — Assessment & Plan Note (Signed)
Tylenol 650mg bid prn is adequate.   

## 2013-11-27 NOTE — Assessment & Plan Note (Signed)
Controlled and continue with Lisinopril 10mg  

## 2013-11-27 NOTE — Progress Notes (Signed)
Patient ID: [name], female   DOB: December 02, 1917, 79 y.o.   MRN: [id number]  Code Status: DNR  Allergies  Allergen Reactions  . Morphine And Related   . Oxytrol [Oxybutynin]     Chief Complaint  Patient presents with  . Medical Managment of Chronic Issues    itchin legs with a few scattered scratched marks, rash right lateral lower leg and nape  . Acute Visit    HPI: Patient is a 79 y.o. female seen in the SNF at [facility] today for evaluation of itching legs and  her chronic medical conditions.  Problem List Items Addressed This Visit   Unspecified essential hypertension     Controlled and continue with Lisinopril 10mg                       Unspecified constipation     Stable on MiraLax daily.                 Pruritus - Primary     Itching and scratched marks BLE right lateral lower leg, nape rash--Claritin 10mg  daily for 4 weeks(recurrent and responded to Claritin in the past). Mycolog II cream bid to the right lateral lower leg rash bid for 2 weeks.     Pain in joint, shoulder region     Tylenol 650mg  bid prn is adequate.         GERD (gastroesophageal reflux disease)     Stable on Omeprazole 20mg  daily.             Dementia     Continue supportive care in SNF and off all memory preserving medications                  Anxiety state, unspecified     Manageable with Celexa 5mg  and Ativan 0.5mg  qam/0.25mg  bid prn.                          Review of Systems:  Review of Systems  Constitutional: Negative for fever and weight loss.       Itching legs with scratched marks. Right lateral lower leg rash. Nape rash   HENT: Negative for congestion and hearing loss.   Eyes: Negative.        Dry eyes.   Respiratory: Negative for cough, sputum production and wheezing.   Cardiovascular: Negative for chest pain, orthopnea and leg swelling.  Gastrointestinal: Negative for nausea,  vomiting, abdominal pain, diarrhea and constipation.  Genitourinary: Positive for frequency (incontinent of bladder) and pelvic pain. Negative for urgency and flank pain.  Musculoskeletal: Positive for falls (if not supervised). Negative for back pain, joint pain and neck pain.  Skin: Negative for itching and rash.       Itching legs with scratched marks. R lateral lower leg rash. Nape rash  Neurological: Negative for tremors, speech change, focal weakness, seizures, loss of consciousness and weakness.  Endo/Heme/Allergies: Negative for polydipsia. Does not bruise/bleed easily.  Psychiatric/Behavioral: Positive for memory loss. Negative for depression and hallucinations. The patient is nervous/anxious (more freqeunt restlessness). The patient does not have insomnia.      Past Medical History  Diagnosis Date  . Malignant neoplasm of colon, unspecified site   . Anxiety state, unspecified   . Alzheimer's disease   . Unspecified essential hypertension   . Chronic airway obstruction, not elsewhere classified   . Barrett's esophagus   . Osteoarthrosis, unspecified whether generalized  or localized, unspecified site   . Pain in joint, shoulder region   . Osteoporosis, unspecified   . Shortness of breath   . Flatulence, eructation, and gas pain   . Urinary frequency   . Hyperglycemia   . Pressure ulcer, heel(707.07)   . Other specified disease of white blood cells 02/24/2013  . Acute conjunctivitis, unspecified 02/24/2013  . Pneumonia, organism unspecified 02/24/2013  . Hypotension, unspecified 02/21/2013  . Acute bronchitis 02/21/2013  . Hemorrhage of gastrointestinal tract, unspecified 02/21/2013  . Altered mental status 02/21/2013  . Contusion of face, scalp, and neck except eye(s) 06/13/2012  . Unspecified fall 06/13/2012  . Hyposmolality and/or hyponatremia 05/17/2012  . Joint pain of lower extremity 04/15/2012  . Acute upper respiratory infections of unspecified site 04/24/2011  .  Hordeolum externum 03/31/2011  . Contact dermatitis and other eczema, due to unspecified cause 09/26/2010  . Candidiasis of other urogenital sites 07/15/2010  . Rash and other nonspecific skin eruption 07/15/2010  . Unspecified vitamin D deficiency 05/07/2010  . Altered mental status 04/11/2010  . Closed fracture of distal end of ulna (alone) 04/12  . Other specified disease of nail 08/16/2009  . Disorders of bursae and tendons in shoulder region, unspecified 06/21/2009  . Pain in joint, pelvic region and thigh 04/12/2009  . Anemia, unspecified 04/05/2009  . Hip joint replacement by other means 03/23/2009  . Unspecified constipation 10/09/2008  . Other malaise and fatigue 11/09/2006  . Aortic aneurysm of unspecified site without mention of rupture 07/28/2005  . Osteoarthrosis, unspecified whether generalized or localized, unspecified site 07/28/2005  . Pain in joint, shoulder region 07/28/2005  . Osteoporosis, unspecified 07/28/2005  . Other emphysema 07/29/1999  . Raynaud's syndrome 07/28/1998   Past Surgical History  Procedure Laterality Date  . Dilation and curettage of uterus    . Colon surgery  2001    partial colon resecton for cancer  . Cataract extraction w/ intraocular lens  implant, bilateral  2004  . Total hip arthroplasty Left 2010   Social History:   reports that she has never smoked. She does not have any smokeless tobacco history on file. She reports that she does not drink alcohol. Her drug history is not on file.  Family History  Problem Relation Age of Onset  . Stroke Mother     Medications: Patient's Medications  New Prescriptions   No medications on file  Previous Medications   ACETAMINOPHEN (TYLENOL) 325 MG TABLET    Take 650 mg by mouth every 6 (six) hours as needed. Take 2 tablets every 6 hours as needed .   CALCIUM-VITAMIN D-VITAMIN K (VIACTIV) 500-500-40 MG-UNT-MCG CHEW    Chew 1 tablet by mouth daily.   CITALOPRAM (CELEXA) 10 MG TABLET    Take 5  mg by mouth daily. Take /12 daily to help nerves and depression.   ERGOCALCIFEROL (VITAMIN D2) 50000 UNITS CAPSULE    Take 50,000 Units by mouth once a week.   LISINOPRIL (PRINIVIL,ZESTRIL) 10 MG TABLET    Take 10 mg by mouth daily.   LORAZEPAM (ATIVAN) 0.5 MG TABLET    Take 0.5 mg by mouth See admin instructions. Takes 0.5mg  daily in the morning and 0.25mg  twice as needed for anxiety throughout the day   OMEPRAZOLE (PRILOSEC) 20 MG CAPSULE    Take 20 mg by mouth 2 (two) times daily.    POLYETHYLENE GLYCOL (MIRALAX / GLYCOLAX) PACKET    Take 17 g by mouth daily.  Modified Medications   No medications  on file  Discontinued Medications   SUCRALFATE (CARAFATE) 1 GM/10ML SUSPENSION    Take 1 g by mouth 2 (two) times daily.      Physical Exam: .phy Filed Vitals:   11/27/13 1059  BP: 110/74  Pulse: 80  Temp: 97 F (36.1 C)  TempSrc: Tympanic  Resp: 16      Labs reviewed: Basic Metabolic Panel:  Recent Labs  16/10/96 08/01/13  NA 135* 140  K 4.2 3.8  BUN 23* 25*  CREATININE 0.8 0.9  TSH  --  0.80   Liver Function Tests:  Recent Labs  02/21/13 08/01/13  AST 15 13  ALT 10 9  ALKPHOS 99 97   CBC:  Recent Labs  02/21/13 08/01/13  WBC 14.0 7.8  HGB 14.2 12.5  HCT 41 37  PLT 308 326   Assessment/Plan Unspecified essential hypertension Controlled and continue with Lisinopril 10mg                     Unspecified constipation Stable on MiraLax daily.               Pain in joint, shoulder region Tylenol 650mg  bid prn is adequate.       GERD (gastroesophageal reflux disease) Stable on Omeprazole 20mg  daily.           Dementia Continue supportive care in SNF and off all memory preserving medications                Anxiety state, unspecified Manageable with Celexa 5mg  and Ativan 0.5mg  qam/0.25mg  bid prn.                     Pruritus Itching and scratched marks BLE right lateral lower leg,  nape rash--Claritin 10mg  daily for 4 weeks(recurrent and responded to Claritin in the past). Mycolog II cream bid to the right lateral lower leg rash bid for 2 weeks.     Family/ Staff Communication: observe the patient.   Goals of Care: SNF  Labs/tests ordered: none    HPI Review of Systems  Constitutional: Negative for fever and weight loss.       Itching legs with scratched marks. Right lateral lower leg rash. Nape rash   HENT: Negative for congestion and hearing loss.   Eyes: Negative.        Dry eyes.   Respiratory: Negative for cough, sputum production and wheezing.   Cardiovascular: Negative for chest pain, orthopnea and leg swelling.  Gastrointestinal: Negative for nausea, vomiting, abdominal pain, diarrhea and constipation.  Endocrine: Negative for polydipsia.  Genitourinary: Positive for frequency (incontinent of bladder) and pelvic pain. Negative for urgency and flank pain.  Musculoskeletal: Positive for falls (if not supervised). Negative for back pain, joint pain and neck pain.  Skin: Negative for itching and rash.       Itching legs with scratched marks. R lateral lower leg rash. Nape rash  Neurological: Negative for tremors, speech change, focal weakness, seizures, loss of consciousness and weakness.  Hematological: Does not bruise/bleed easily.  Psychiatric/Behavioral: Positive for memory loss. Negative for depression and hallucinations. The patient is nervous/anxious (more freqeunt restlessness). The patient does not have insomnia.        Objective:   Physical Exam  Constitutional: She appears well-developed and well-nourished.  HENT:  Head: Normocephalic and atraumatic.  Eyes: Conjunctivae and EOM are normal. Pupils are equal, round, and reactive to light. Right eye exhibits no discharge. Left eye exhibits no discharge. No scleral icterus.  Neck: Normal range of motion. Neck supple. No JVD present. No thyromegaly present.  Cardiovascular: Normal rate,  regular rhythm and normal heart sounds.   No murmur heard. Pulmonary/Chest: Effort normal. She has no wheezes. She has rales (bibasilar dry rales appreciated).  Abdominal: Soft. Bowel sounds are normal. There is no tenderness.  Musculoskeletal: Normal range of motion. She exhibits no edema and no tenderness.  Neurological: She is alert. No cranial nerve deficit. She exhibits normal muscle tone. Coordination normal.  Skin: Skin is warm and dry. No rash noted.  Itching legs with scratched marks. Lateral right lower leg rash. Nape rash   Psychiatric: Her mood appears anxious. Her affect is labile. Her affect is not angry. Her speech is tangential. Her speech is not slurred. She is agitated (when assisted with peronal care) and combative (when she doesn't understad what you want her to do). She is not aggressive, not hyperactive and not withdrawn. Thought content is not paranoid and not delusional. Cognition and memory are impaired. She expresses impulsivity and inappropriate judgment. She does not exhibit a depressed mood. She exhibits abnormal recent memory and abnormal remote memory.

## 2013-11-27 NOTE — Assessment & Plan Note (Signed)
Stable on Omeprazole 20 mg daily.

## 2013-11-27 NOTE — Assessment & Plan Note (Signed)
Stable on MiraLax daily.  

## 2014-01-03 ENCOUNTER — Encounter: Payer: Self-pay | Admitting: Nurse Practitioner

## 2014-01-03 ENCOUNTER — Non-Acute Institutional Stay (SKILLED_NURSING_FACILITY): Payer: Medicare Other | Admitting: Nurse Practitioner

## 2014-01-03 DIAGNOSIS — L299 Pruritus, unspecified: Secondary | ICD-10-CM

## 2014-01-03 DIAGNOSIS — I10 Essential (primary) hypertension: Secondary | ICD-10-CM

## 2014-01-03 DIAGNOSIS — K5900 Constipation, unspecified: Secondary | ICD-10-CM

## 2014-01-03 DIAGNOSIS — K2270 Barrett's esophagus without dysplasia: Secondary | ICD-10-CM

## 2014-01-03 DIAGNOSIS — F411 Generalized anxiety disorder: Secondary | ICD-10-CM

## 2014-01-03 DIAGNOSIS — G309 Alzheimer's disease, unspecified: Secondary | ICD-10-CM

## 2014-01-03 DIAGNOSIS — F0280 Dementia in other diseases classified elsewhere without behavioral disturbance: Secondary | ICD-10-CM

## 2014-01-03 NOTE — Assessment & Plan Note (Signed)
Stable on MiraLax daily.  

## 2014-01-03 NOTE — Assessment & Plan Note (Signed)
Continue supportive care in SNF and off all memory preserving medications   

## 2014-01-03 NOTE — Progress Notes (Signed)
Patient ID: Jamie PontesRuth D Ramirez, female   DOB: April 10, 1917, 80 y.o.   MRN: [id number]     Patient ID: Jamie PontesRuth D Ramirez, female   DOB: April 10, 1917, 80 y.o.   MRN: [id number]  Code Status: DNR  Allergies  Allergen Reactions  . Morphine And Related   . Oxytrol [Oxybutynin]     Chief Complaint  Patient presents with  . Medical Managment of Chronic Issues    HPI: Patient is a 80 y.o. female [facility] for evaluation of her chronic medical conditions.  Problem List Items Addressed This Visit   None      Review of Systems:  Review of Systems  Constitutional: Negative for fever and weight loss.       Itching legs with scratched marks. Right lateral lower leg rash. Nape rash-resolved.    HENT: Negative for congestion and hearing loss.   Eyes: Negative.        Dry eyes.   Respiratory: Negative for cough, sputum production and wheezing.   Cardiovascular: Negative for chest pain, orthopnea and leg swelling.  Gastrointestinal: Negative for nausea, vomiting, abdominal pain, diarrhea and constipation.  Genitourinary: Positive for frequency (incontinent of bladder) and pelvic pain. Negative for urgency and flank pain.  Musculoskeletal: Positive for falls (if not supervised). Negative for back pain, joint pain and neck pain.  Skin: Negative for itching and rash.       Itching legs with scratched marks. R lateral lower leg rash. Nape rash  Neurological: Negative for tremors, speech change, focal weakness, seizures, loss of consciousness and weakness.  Endo/Heme/Allergies: Negative for polydipsia. Does not bruise/bleed easily.  Psychiatric/Behavioral: Positive for memory loss. Negative for depression and hallucinations. The patient is nervous/anxious (more freqeunt restlessness). The patient does not have insomnia.      Past Medical History  Diagnosis Date  . Malignant neoplasm of colon, unspecified site   . Anxiety state, unspecified   . Alzheimer's disease   .  Unspecified essential hypertension   . Chronic airway obstruction, not elsewhere classified   . Barrett's esophagus   . Osteoarthrosis, unspecified whether generalized or localized, unspecified site   . Pain in joint, shoulder region   . Osteoporosis, unspecified   . Shortness of breath   . Flatulence, eructation, and gas pain   . Urinary frequency   . Hyperglycemia   . Pressure ulcer, heel(707.07)   . Other specified disease of white blood cells 02/24/2013  . Acute conjunctivitis, unspecified 02/24/2013  . Pneumonia, organism unspecified 02/24/2013  . Hypotension, unspecified 02/21/2013  . Acute bronchitis 02/21/2013  . Hemorrhage of gastrointestinal tract, unspecified 02/21/2013  . Altered mental status 02/21/2013  . Contusion of face, scalp, and neck except eye(s) 06/13/2012  . Unspecified fall 06/13/2012  . Hyposmolality and/or hyponatremia 05/17/2012  . Joint pain of lower extremity 04/15/2012  . Acute upper respiratory infections of unspecified site 04/24/2011  . Hordeolum externum 03/31/2011  . Contact dermatitis and other eczema, due to unspecified cause 09/26/2010  . Candidiasis of other urogenital sites 07/15/2010  . Rash and other nonspecific skin eruption 07/15/2010  . Unspecified vitamin D deficiency 05/07/2010  . Altered mental status 04/11/2010  . Closed fracture of distal end of ulna (alone) 04/12  . Other specified disease of nail 08/16/2009  . Disorders of bursae and tendons in shoulder region, unspecified 06/21/2009  . Pain in joint, pelvic region and thigh 04/12/2009  . Anemia, unspecified 04/05/2009  . Hip joint replacement by other means  03/23/2009  . Unspecified constipation 10/09/2008  . Other malaise and fatigue 11/09/2006  . Aortic aneurysm of unspecified site without mention of rupture 07/28/2005  . Osteoarthrosis, unspecified whether generalized or localized, unspecified site 07/28/2005  . Pain in joint, shoulder region 07/28/2005  . Osteoporosis,  unspecified 07/28/2005  . Other emphysema 07/29/1999  . Raynaud's syndrome 07/28/1998   Past Surgical History  Procedure Laterality Date  . Dilation and curettage of uterus    . Colon surgery  2001    partial colon resecton for cancer  . Cataract extraction w/ intraocular lens  implant, bilateral  2004  . Total hip arthroplasty Left 2010   Social History:   reports that she has never smoked. She does not have any smokeless tobacco history on file. She reports that she does not drink alcohol. Her drug history is not on file.  Family History  Problem Relation Age of Onset  . Stroke Mother     Medications: Patient's Medications  New Prescriptions   No medications on file  Previous Medications   ACETAMINOPHEN (TYLENOL) 325 MG TABLET    Take 650 mg by mouth every 6 (six) hours as needed. Take 2 tablets every 6 hours as needed .   CALCIUM-VITAMIN D-VITAMIN K (VIACTIV) 867-619-50 MG-UNT-MCG CHEW    Chew 1 tablet by mouth daily.   CITALOPRAM (CELEXA) 10 MG TABLET    Take 5 mg by mouth daily. Take /12 daily to help nerves and depression.   ERGOCALCIFEROL (VITAMIN D2) 50000 UNITS CAPSULE    Take 50,000 Units by mouth once a week.   LISINOPRIL (PRINIVIL,ZESTRIL) 10 MG TABLET    Take 10 mg by mouth daily.   LORAZEPAM (ATIVAN) 0.5 MG TABLET    Take 0.5 mg by mouth See admin instructions. Takes 0.5mg  daily in the morning and 0.25mg  twice as needed for anxiety throughout the day   OMEPRAZOLE (PRILOSEC) 20 MG CAPSULE    Take 20 mg by mouth 2 (two) times daily.    POLYETHYLENE GLYCOL (MIRALAX / GLYCOLAX) PACKET    Take 17 g by mouth daily.  Modified Medications   No medications on file  Discontinued Medications   No medications on file     Physical Exam: .phy Filed Vitals:   01/03/14 0821  BP: 118/80  Pulse: 72  Temp: 98.6 F (37 C)  TempSrc: Tympanic  Resp: 16      Labs reviewed: Basic Metabolic Panel:  Recent Labs  02/21/13 08/01/13  NA 135* 140  K 4.2 3.8  BUN 23* 25*    CREATININE 0.8 0.9  TSH  --  0.80   Liver Function Tests:  Recent Labs  02/21/13 08/01/13  AST 15 13  ALT 10 9  ALKPHOS 99 97   CBC:  Recent Labs  02/21/13 08/01/13  WBC 14.0 7.8  HGB 14.2 12.5  HCT 41 37  PLT 308 326   Assessment/Plan No problem-specific assessment & plan notes found for this encounter.   Family/ Staff Communication: observe the patient.   Goals of Care: SNF  Labs/tests ordered: none    HPI Review of Systems  Constitutional: Negative for fever and weight loss.       Itching legs with scratched marks. Right lateral lower leg rash. Nape rash-resolved.    HENT: Negative for congestion and hearing loss.   Eyes: Negative.        Dry eyes.   Respiratory: Negative for cough, sputum production and wheezing.   Cardiovascular: Negative for chest pain, orthopnea  and leg swelling.  Gastrointestinal: Negative for nausea, vomiting, abdominal pain, diarrhea and constipation.  Endocrine: Negative for polydipsia.  Genitourinary: Positive for frequency (incontinent of bladder) and pelvic pain. Negative for urgency and flank pain.  Musculoskeletal: Positive for falls (if not supervised). Negative for back pain, joint pain and neck pain.  Skin: Negative for itching and rash.       Itching legs with scratched marks. R lateral lower leg rash. Nape rash  Neurological: Negative for tremors, speech change, focal weakness, seizures, loss of consciousness and weakness.  Hematological: Does not bruise/bleed easily.  Psychiatric/Behavioral: Positive for memory loss. Negative for depression and hallucinations. The patient is nervous/anxious (more freqeunt restlessness). The patient does not have insomnia.        Objective:   Physical Exam  Constitutional: She appears well-developed and well-nourished.  HENT:  Head: Normocephalic and atraumatic.  Eyes: Conjunctivae and EOM are normal. Pupils are equal, round, and reactive to light. Right eye exhibits no discharge. Left  eye exhibits no discharge. No scleral icterus.  Neck: Normal range of motion. Neck supple. No JVD present. No thyromegaly present.  Cardiovascular: Normal rate, regular rhythm and normal heart sounds.   No murmur heard. Pulmonary/Chest: Effort normal. She has no wheezes. She has rales (bibasilar dry rales appreciated).  Abdominal: Soft. Bowel sounds are normal. There is no tenderness.  Musculoskeletal: Normal range of motion. She exhibits no edema and no tenderness.  Neurological: She is alert. No cranial nerve deficit. She exhibits normal muscle tone. Coordination normal.  Skin: Skin is warm and dry. No rash noted.     Psychiatric: Her mood appears anxious. Her affect is labile. Her affect is not angry. Her speech is tangential. Her speech is not slurred. She is agitated (when assisted with peronal care) and combative (when she doesn't understad what you want her to do). She is not aggressive, not hyperactive and not withdrawn. Thought content is not paranoid and not delusional. Cognition and memory are impaired. She expresses impulsivity and inappropriate judgment. She does not exhibit a depressed mood. She exhibits abnormal recent memory and abnormal remote memory.               This encounter was created in error - please disregard.

## 2014-01-03 NOTE — Assessment & Plan Note (Signed)
Stable on Omeprazole 20 mg daily.

## 2014-01-03 NOTE — Assessment & Plan Note (Signed)
Itching and scratched marks BLE right lateral lower leg, nape rash--resolve after Claritin 10mg  daily for 4 weeks(recurrent and responded to Claritin in the past). Mycolog II cream bid to the right lateral lower leg rash bid for 2 weeks.

## 2014-01-03 NOTE — Assessment & Plan Note (Signed)
Manageable with Celexa 5mg and Ativan 0.5mg qam/0.25mg bid prn.  

## 2014-01-03 NOTE — Progress Notes (Signed)
Patient ID: [name], female   DOB: [birth year], 80 y.o.   MRN: [id number]   Code Status: DNR  Allergies  Allergen Reactions  . Morphine And Related   . Oxytrol [Oxybutynin]     Chief Complaint  Patient presents with  . Medical Managment of Chronic Issues    HPI: Patient is a 80 y.o. female seen in the SNF at [facility] today for evaluation of chronic medical conditions.  Problem List Items Addressed This Visit   Anxiety state, unspecified     Manageable with Celexa 5mg  and Ativan 0.5mg  qam/0.25mg  bid prn.                         Unspecified constipation     Stable on MiraLax daily.                   Alzheimer's disease     Continue supportive care in SNF and off all memory preserving medications                  Unspecified essential hypertension     Controlled and continue with Lisinopril 10mg                         Barrett's esophagus     Stable on Omeprazole 20mg  daily.               Pruritus - Primary     Itching and scratched marks BLE right lateral lower leg, nape rash--resolve after Claritin 10mg  daily for 4 weeks(recurrent and responded to Claritin in the past). Mycolog II cream bid to the right lateral lower leg rash bid for 2 weeks.         Review of Systems:  Review of Systems  Constitutional: Negative for fever and weight loss.       Itching legs with scratched marks. Right lateral lower leg rash. Nape rash-resolved.    HENT: Negative for congestion and hearing loss.   Eyes: Negative.        Dry eyes.   Respiratory: Negative for cough, sputum production and wheezing.   Cardiovascular: Negative for chest pain, orthopnea and leg swelling.  Gastrointestinal: Negative for nausea, vomiting, abdominal pain, diarrhea and constipation.  Genitourinary: Positive for frequency (incontinent of bladder) and pelvic pain. Negative for urgency and flank pain.  Musculoskeletal:  Positive for falls (if not supervised). Negative for back pain, joint pain and neck pain.  Skin: Negative for itching and rash.       Itching legs with scratched marks. R lateral lower leg rash. Nape rash  Neurological: Negative for tremors, speech change, focal weakness, seizures, loss of consciousness and weakness.  Endo/Heme/Allergies: Negative for polydipsia. Does not bruise/bleed easily.  Psychiatric/Behavioral: Positive for memory loss. Negative for depression and hallucinations. The patient is nervous/anxious (more freqeunt restlessness). The patient does not have insomnia.      Past Medical History  Diagnosis Date  . Malignant neoplasm of colon, unspecified site   . Anxiety state, unspecified   . Alzheimer's disease   . Unspecified essential hypertension   . Chronic airway obstruction, not elsewhere classified   . Barrett's esophagus   . Osteoarthrosis, unspecified whether generalized or localized, unspecified site   . Pain in joint, shoulder region   . Osteoporosis, unspecified   . Shortness of breath   . Flatulence, eructation, and gas pain   . Urinary frequency   . Hyperglycemia   .  Pressure ulcer, heel(707.07)   . Other specified disease of white blood cells 02/24/2013  . Acute conjunctivitis, unspecified 02/24/2013  . Pneumonia, organism unspecified 02/24/2013  . Hypotension, unspecified 02/21/2013  . Acute bronchitis 02/21/2013  . Hemorrhage of gastrointestinal tract, unspecified 02/21/2013  . Altered mental status 02/21/2013  . Contusion of face, scalp, and neck except eye(s) 06/13/2012  . Unspecified fall 06/13/2012  . Hyposmolality and/or hyponatremia 05/17/2012  . Joint pain of lower extremity 04/15/2012  . Acute upper respiratory infections of unspecified site 04/24/2011  . Hordeolum externum 03/31/2011  . Contact dermatitis and other eczema, due to unspecified cause 09/26/2010  . Candidiasis of other urogenital sites 07/15/2010  . Rash and other nonspecific  skin eruption 07/15/2010  . Unspecified vitamin D deficiency 05/07/2010  . Altered mental status 04/11/2010  . Closed fracture of distal end of ulna (alone) 04/12  . Other specified disease of nail 08/16/2009  . Disorders of bursae and tendons in shoulder region, unspecified 06/21/2009  . Pain in joint, pelvic region and thigh 04/12/2009  . Anemia, unspecified 04/05/2009  . Hip joint replacement by other means 03/23/2009  . Unspecified constipation 10/09/2008  . Other malaise and fatigue 11/09/2006  . Aortic aneurysm of unspecified site without mention of rupture 07/28/2005  . Osteoarthrosis, unspecified whether generalized or localized, unspecified site 07/28/2005  . Pain in joint, shoulder region 07/28/2005  . Osteoporosis, unspecified 07/28/2005  . Other emphysema 07/29/1999  . Raynaud's syndrome 07/28/1998   Past Surgical History  Procedure Laterality Date  . Dilation and curettage of uterus    . Colon surgery  2001    partial colon resecton for cancer  . Cataract extraction w/ intraocular lens  implant, bilateral  2004  . Total hip arthroplasty Left 2010   Social History:   reports that she has never smoked. She does not have any smokeless tobacco history on file. She reports that she does not drink alcohol. Her drug history is not on file.  Family History  Problem Relation Age of Onset  . Stroke Mother     Medications: Patient's Medications  New Prescriptions   No medications on file  Previous Medications   ACETAMINOPHEN (TYLENOL) 325 MG TABLET    Take 650 mg by mouth every 6 (six) hours as needed. Take 2 tablets every 6 hours as needed .   CALCIUM-VITAMIN D-VITAMIN K (VIACTIV) [id number] MG-UNT-MCG CHEW    Chew 1 tablet by mouth daily.   CITALOPRAM (CELEXA) 10 MG TABLET    Take 5 mg by mouth daily. Take /12 daily to help nerves and depression.   ERGOCALCIFEROL (VITAMIN D2) 50000 UNITS CAPSULE    Take 50,000 Units by mouth once a week.   LISINOPRIL (PRINIVIL,ZESTRIL)  10 MG TABLET    Take 10 mg by mouth daily.   LORAZEPAM (ATIVAN) 0.5 MG TABLET    Take 0.5 mg by mouth See admin instructions. Takes 0.5mg  daily in the morning and 0.25mg  twice as needed for anxiety throughout the day   OMEPRAZOLE (PRILOSEC) 20 MG CAPSULE    Take 20 mg by mouth 2 (two) times daily.    POLYETHYLENE GLYCOL (MIRALAX / GLYCOLAX) PACKET    Take 17 g by mouth daily.  Modified Medications   No medications on file  Discontinued Medications   No medications on file     Physical Exam: Physical Exam  Constitutional: She appears well-developed and well-nourished.  HENT:  Head: Normocephalic and atraumatic.  Eyes: Conjunctivae and EOM are normal. Pupils are equal,  round, and reactive to light. Right eye exhibits no discharge. Left eye exhibits no discharge. No scleral icterus.  Neck: Normal range of motion. Neck supple. No JVD present. No thyromegaly present.  Cardiovascular: Normal rate, regular rhythm and normal heart sounds.   No murmur heard. Pulmonary/Chest: Effort normal. She has no wheezes. She has rales (bibasilar dry rales appreciated).  Abdominal: Soft. Bowel sounds are normal. There is no tenderness.  Musculoskeletal: Normal range of motion. She exhibits no edema and no tenderness.  Neurological: She is alert. No cranial nerve deficit. She exhibits normal muscle tone. Coordination normal.  Skin: Skin is warm and dry. No rash noted.     Psychiatric: Her mood appears anxious. Her affect is labile. Her affect is not angry. Her speech is tangential. Her speech is not slurred. She is agitated (when assisted with peronal care) and combative (when she doesn't understad what you want her to do). She is not aggressive, not hyperactive and not withdrawn. Thought content is not paranoid and not delusional. Cognition and memory are impaired. She expresses impulsivity and inappropriate judgment. She does not exhibit a depressed mood. She exhibits abnormal recent memory and abnormal remote  memory.    Filed Vitals:   01/03/14 0825  BP: 118/80  Pulse: 72  Temp: 98.6 F (37 C)  TempSrc: Tympanic  Resp: 16      Labs reviewed: Basic Metabolic Panel:  Recent Labs  02/21/13 08/01/13  NA 135* 140  K 4.2 3.8  BUN 23* 25*  CREATININE 0.8 0.9  TSH  --  0.80   Liver Function Tests:  Recent Labs  02/21/13 08/01/13  AST 15 13  ALT 10 9  ALKPHOS 99 97   CBC:  Recent Labs  02/21/13 08/01/13  WBC 14.0 7.8  HGB 14.2 12.5  HCT 41 37  PLT 308 326   Assessment/Plan Pruritus Itching and scratched marks BLE right lateral lower leg, nape rash--resolve after Claritin 10mg  daily for 4 weeks(recurrent and responded to Claritin in the past). Mycolog II cream bid to the right lateral lower leg rash bid for 2 weeks.    Barrett's esophagus Stable on Omeprazole 20mg  daily.             Unspecified essential hypertension Controlled and continue with Lisinopril 10mg                       Alzheimer's disease Continue supportive care in SNF and off all memory preserving medications                Unspecified constipation Stable on MiraLax daily.                 Anxiety state, unspecified Manageable with Celexa 5mg  and Ativan 0.5mg  qam/0.25mg  bid prn.                         Family/ Staff Communication: observe the patient  Goals of Care: SNF  Labs/tests ordered: none

## 2014-01-03 NOTE — Assessment & Plan Note (Signed)
Controlled and continue with Lisinopril 10mg  

## 2014-03-07 ENCOUNTER — Encounter: Payer: Self-pay | Admitting: Nurse Practitioner

## 2014-03-07 ENCOUNTER — Non-Acute Institutional Stay (SKILLED_NURSING_FACILITY): Payer: Medicare Other | Admitting: Nurse Practitioner

## 2014-03-07 DIAGNOSIS — M1990 Unspecified osteoarthritis, unspecified site: Secondary | ICD-10-CM

## 2014-03-07 DIAGNOSIS — I10 Essential (primary) hypertension: Secondary | ICD-10-CM

## 2014-03-07 DIAGNOSIS — J449 Chronic obstructive pulmonary disease, unspecified: Secondary | ICD-10-CM

## 2014-03-07 DIAGNOSIS — F0390 Unspecified dementia without behavioral disturbance: Secondary | ICD-10-CM

## 2014-03-07 DIAGNOSIS — K219 Gastro-esophageal reflux disease without esophagitis: Secondary | ICD-10-CM

## 2014-03-07 DIAGNOSIS — F411 Generalized anxiety disorder: Secondary | ICD-10-CM

## 2014-03-07 DIAGNOSIS — J4489 Other specified chronic obstructive pulmonary disease: Secondary | ICD-10-CM

## 2014-03-07 DIAGNOSIS — K5900 Constipation, unspecified: Secondary | ICD-10-CM

## 2014-03-07 NOTE — Assessment & Plan Note (Signed)
Stable on MiraLax daily.  

## 2014-03-07 NOTE — Assessment & Plan Note (Signed)
Continue supportive care in SNF and off all memory preserving medications

## 2014-03-07 NOTE — Assessment & Plan Note (Signed)
Controlled and continue with Lisinopril 10mg  

## 2014-03-07 NOTE — Assessment & Plan Note (Signed)
Tylenol 650mg bid is adequate.  

## 2014-03-07 NOTE — Progress Notes (Signed)
Patient ID: [name], female   DOB: 03-04-1917, 80 y.o.   MRN: [id number]   Code Status: DNR  Allergies  Allergen Reactions  . Morphine And Related   . Oxytrol [Oxybutynin]     Chief Complaint  Patient presents with  . Medical Managment of Chronic Issues    HPI: Patient is a 80 y.o. female seen in the SNF at [facility] today for evaluation of chronic medical conditions.  Problem List Items Addressed This Visit   Unspecified essential hypertension - Primary     Controlled and continue with Lisinopril 10mg        Unspecified constipation     Stable on MiraLax daily.       Osteoarthrosis, unspecified whether generalized or localized, unspecified site     Tylenol 650mg  bid is adequate.       GERD (gastroesophageal reflux disease)     Stable on Omeprazole 20mg  daily.        Dementia     Continue supportive care in SNF and off all memory preserving medications       Chronic airway obstruction, not elsewhere classified     Stable, not O2 dependent.     Anxiety state, unspecified     Manageable with Celexa 5mg  and Ativan 0.5mg  qam/0.25mg  bid prn.         Review of Systems:  Review of Systems  Constitutional: Negative for fever and weight loss.       Itching legs with scratched marks. Right lateral lower leg rash. Nape rash-resolved.    HENT: Negative for congestion and hearing loss.   Eyes: Negative.        Dry eyes.   Respiratory: Negative for cough, sputum production and wheezing.   Cardiovascular: Negative for chest pain, orthopnea and leg swelling.  Gastrointestinal: Negative for nausea, vomiting, abdominal pain, diarrhea and constipation.  Genitourinary: Positive for frequency (incontinent of bladder) and pelvic pain. Negative for urgency and flank pain.  Musculoskeletal: Positive for falls (if not supervised). Negative for back pain, joint pain and neck pain.  Skin: Negative for itching and rash.       Itching legs with scratched marks.  R lateral lower leg rash. Nape rash  Neurological: Negative for tremors, speech change, focal weakness, seizures, loss of consciousness and weakness.  Endo/Heme/Allergies: Negative for polydipsia. Does not bruise/bleed easily.  Psychiatric/Behavioral: Positive for memory loss. Negative for depression and hallucinations. The patient is nervous/anxious (more freqeunt restlessness). The patient does not have insomnia.      Past Medical History  Diagnosis Date  . Malignant neoplasm of colon, unspecified site   . Anxiety state, unspecified   . Alzheimer's disease   . Unspecified essential hypertension   . Chronic airway obstruction, not elsewhere classified   . Barrett's esophagus   . Osteoarthrosis, unspecified whether generalized or localized, unspecified site   . Pain in joint, shoulder region   . Osteoporosis, unspecified   . Shortness of breath   . Flatulence, eructation, and gas pain   . Urinary frequency   . Hyperglycemia   . Pressure ulcer, heel(707.07)   . Other specified disease of white blood cells 02/24/2013  . Acute conjunctivitis, unspecified 02/24/2013  . Pneumonia, organism unspecified 02/24/2013  . Hypotension, unspecified 02/21/2013  . Acute bronchitis 02/21/2013  . Hemorrhage of gastrointestinal tract, unspecified 02/21/2013  . Altered mental status 02/21/2013  . Contusion of face, scalp, and neck except eye(s) 06/13/2012  . Unspecified fall 06/13/2012  . Hyposmolality and/or hyponatremia 05/17/2012  .  Joint pain of lower extremity 04/15/2012  . Acute upper respiratory infections of unspecified site 04/24/2011  . Hordeolum externum 03/31/2011  . Contact dermatitis and other eczema, due to unspecified cause 09/26/2010  . Candidiasis of other urogenital sites 07/15/2010  . Rash and other nonspecific skin eruption 07/15/2010  . Unspecified vitamin D deficiency 05/07/2010  . Altered mental status 04/11/2010  . Closed fracture of distal end of ulna (alone) 04/12  .  Other specified disease of nail 08/16/2009  . Disorders of bursae and tendons in shoulder region, unspecified 06/21/2009  . Pain in joint, pelvic region and thigh 04/12/2009  . Anemia, unspecified 04/05/2009  . Hip joint replacement by other means 03/23/2009  . Unspecified constipation 10/09/2008  . Other malaise and fatigue 11/09/2006  . Aortic aneurysm of unspecified site without mention of rupture 07/28/2005  . Osteoarthrosis, unspecified whether generalized or localized, unspecified site 07/28/2005  . Pain in joint, shoulder region 07/28/2005  . Osteoporosis, unspecified 07/28/2005  . Other emphysema 07/29/1999  . Raynaud's syndrome 07/28/1998   Past Surgical History  Procedure Laterality Date  . Dilation and curettage of uterus    . Colon surgery  2001    partial colon resecton for cancer  . Cataract extraction w/ intraocular lens  implant, bilateral  2004  . Total hip arthroplasty Left 2010   Social History:   reports that she has never smoked. She does not have any smokeless tobacco history on file. She reports that she does not drink alcohol. Her drug history is not on file.  Family History  Problem Relation Age of Onset  . Stroke Mother     Medications: Patient's Medications  New Prescriptions   No medications on file  Previous Medications   ACETAMINOPHEN (TYLENOL) 325 MG TABLET    Take 650 mg by mouth every 6 (six) hours as needed. Take 2 tablets every 6 hours as needed .   CALCIUM-VITAMIN D-VITAMIN K (VIACTIV) [id number] MG-UNT-MCG CHEW    Chew 1 tablet by mouth daily.   CITALOPRAM (CELEXA) 10 MG TABLET    Take 5 mg by mouth daily. Take /12 daily to help nerves and depression.   ERGOCALCIFEROL (VITAMIN D2) 50000 UNITS CAPSULE    Take 50,000 Units by mouth once a week.   LISINOPRIL (PRINIVIL,ZESTRIL) 10 MG TABLET    Take 10 mg by mouth daily.   LORAZEPAM (ATIVAN) 0.5 MG TABLET    Take 0.5 mg by mouth See admin instructions. Takes 0.5mg  daily in the morning and 0.25mg   twice as needed for anxiety throughout the day   OMEPRAZOLE (PRILOSEC) 20 MG CAPSULE    Take 20 mg by mouth 2 (two) times daily.    POLYETHYLENE GLYCOL (MIRALAX / GLYCOLAX) PACKET    Take 17 g by mouth daily.  Modified Medications   No medications on file  Discontinued Medications   No medications on file     Physical Exam: Physical Exam  Constitutional: She appears well-developed and well-nourished.  HENT:  Head: Normocephalic and atraumatic.  Eyes: Conjunctivae and EOM are normal. Pupils are equal, round, and reactive to light. Right eye exhibits no discharge. Left eye exhibits no discharge. No scleral icterus.  Neck: Normal range of motion. Neck supple. No JVD present. No thyromegaly present.  Cardiovascular: Normal rate, regular rhythm and normal heart sounds.   No murmur heard. Pulmonary/Chest: Effort normal. She has no wheezes. She has rales (bibasilar dry rales appreciated).  Abdominal: Soft. Bowel sounds are normal. There is no tenderness.  Musculoskeletal:  Normal range of motion. She exhibits no edema and no tenderness.  Neurological: She is alert. No cranial nerve deficit. She exhibits normal muscle tone. Coordination normal.  Skin: Skin is warm and dry. No rash noted.     Psychiatric: Her mood appears anxious. Her affect is labile. Her affect is not angry. Her speech is tangential. Her speech is not slurred. She is agitated (when assisted with peronal care) and combative (when she doesn't understad what you want her to do). She is not aggressive, not hyperactive and not withdrawn. Thought content is not paranoid and not delusional. Cognition and memory are impaired. She expresses impulsivity and inappropriate judgment. She does not exhibit a depressed mood. She exhibits abnormal recent memory and abnormal remote memory.    Filed Vitals:   03/07/14 1815  BP: 126/70  Pulse: 74  Temp: 98.4 F (36.9 C)  TempSrc: Tympanic  Resp: 18      Labs reviewed: Basic Metabolic  Panel:  Recent Labs  08/01/13  NA 140  K 3.8  BUN 25*  CREATININE 0.9  TSH 0.80   Liver Function Tests:  Recent Labs  08/01/13  AST 13  ALT 9  ALKPHOS 97   CBC:  Recent Labs  08/01/13  WBC 7.8  HGB 12.5  HCT 37  PLT 326   Assessment/Plan Unspecified essential hypertension Controlled and continue with Lisinopril 10mg      Unspecified constipation Stable on MiraLax daily.     Osteoarthrosis, unspecified whether generalized or localized, unspecified site Tylenol 650mg  bid is adequate.     GERD (gastroesophageal reflux disease) Stable on Omeprazole 20mg  daily.      Dementia Continue supportive care in SNF and off all memory preserving medications     Chronic airway obstruction, not elsewhere classified Stable, not O2 dependent.   Anxiety state, unspecified Manageable with Celexa 5mg  and Ativan 0.5mg  qam/0.25mg  bid prn.      Family/ Staff Communication: observe the patient  Goals of Care: SNF  Labs/tests ordered: none

## 2014-03-07 NOTE — Assessment & Plan Note (Signed)
Manageable with Celexa 5mg and Ativan 0.5mg qam/0.25mg bid prn.  

## 2014-03-07 NOTE — Assessment & Plan Note (Signed)
Stable on Omeprazole 20 mg daily.

## 2014-03-07 NOTE — Assessment & Plan Note (Signed)
Stable, not O2 dependent.

## 2014-03-13 ENCOUNTER — Encounter (HOSPITAL_COMMUNITY): Payer: Self-pay | Admitting: Emergency Medicine

## 2014-03-13 ENCOUNTER — Emergency Department (HOSPITAL_COMMUNITY)
Admission: EM | Admit: 2014-03-13 | Discharge: 2014-03-13 | Disposition: A | Discharge status: Home / Self Care | Payer: Medicare Other | Attending: Emergency Medicine | Admitting: Emergency Medicine

## 2014-03-13 ENCOUNTER — Emergency Department (HOSPITAL_COMMUNITY): Payer: Medicare Other

## 2014-03-13 DIAGNOSIS — F0280 Dementia in other diseases classified elsewhere without behavioral disturbance: Secondary | ICD-10-CM | POA: Insufficient documentation

## 2014-03-13 DIAGNOSIS — W19XXXA Unspecified fall, initial encounter: Secondary | ICD-10-CM

## 2014-03-13 DIAGNOSIS — Z8619 Personal history of other infectious and parasitic diseases: Secondary | ICD-10-CM | POA: Insufficient documentation

## 2014-03-13 DIAGNOSIS — IMO0002 Reserved for concepts with insufficient information to code with codable children: Secondary | ICD-10-CM | POA: Insufficient documentation

## 2014-03-13 DIAGNOSIS — W050XXA Fall from non-moving wheelchair, initial encounter: Secondary | ICD-10-CM | POA: Insufficient documentation

## 2014-03-13 DIAGNOSIS — Z8719 Personal history of other diseases of the digestive system: Secondary | ICD-10-CM | POA: Insufficient documentation

## 2014-03-13 DIAGNOSIS — F919 Conduct disorder, unspecified: Secondary | ICD-10-CM | POA: Insufficient documentation

## 2014-03-13 DIAGNOSIS — Z862 Personal history of diseases of the blood and blood-forming organs and certain disorders involving the immune mechanism: Secondary | ICD-10-CM | POA: Insufficient documentation

## 2014-03-13 DIAGNOSIS — F29 Unspecified psychosis not due to a substance or known physiological condition: Secondary | ICD-10-CM | POA: Insufficient documentation

## 2014-03-13 DIAGNOSIS — Z8701 Personal history of pneumonia (recurrent): Secondary | ICD-10-CM | POA: Insufficient documentation

## 2014-03-13 DIAGNOSIS — J438 Other emphysema: Secondary | ICD-10-CM | POA: Insufficient documentation

## 2014-03-13 DIAGNOSIS — S0180XA Unspecified open wound of other part of head, initial encounter: Secondary | ICD-10-CM | POA: Insufficient documentation

## 2014-03-13 DIAGNOSIS — K5900 Constipation, unspecified: Secondary | ICD-10-CM | POA: Insufficient documentation

## 2014-03-13 DIAGNOSIS — G309 Alzheimer's disease, unspecified: Secondary | ICD-10-CM | POA: Insufficient documentation

## 2014-03-13 DIAGNOSIS — Y9389 Activity, other specified: Secondary | ICD-10-CM | POA: Insufficient documentation

## 2014-03-13 DIAGNOSIS — M1990 Unspecified osteoarthritis, unspecified site: Secondary | ICD-10-CM | POA: Insufficient documentation

## 2014-03-13 DIAGNOSIS — E559 Vitamin D deficiency, unspecified: Secondary | ICD-10-CM | POA: Insufficient documentation

## 2014-03-13 DIAGNOSIS — F411 Generalized anxiety disorder: Secondary | ICD-10-CM | POA: Insufficient documentation

## 2014-03-13 DIAGNOSIS — Z85038 Personal history of other malignant neoplasm of large intestine: Secondary | ICD-10-CM | POA: Insufficient documentation

## 2014-03-13 DIAGNOSIS — Z23 Encounter for immunization: Secondary | ICD-10-CM | POA: Insufficient documentation

## 2014-03-13 DIAGNOSIS — M810 Age-related osteoporosis without current pathological fracture: Secondary | ICD-10-CM | POA: Insufficient documentation

## 2014-03-13 DIAGNOSIS — Z79899 Other long term (current) drug therapy: Secondary | ICD-10-CM | POA: Insufficient documentation

## 2014-03-13 DIAGNOSIS — I10 Essential (primary) hypertension: Secondary | ICD-10-CM | POA: Insufficient documentation

## 2014-03-13 DIAGNOSIS — Y9210 Unspecified residential institution as the place of occurrence of the external cause: Secondary | ICD-10-CM | POA: Insufficient documentation

## 2014-03-13 DIAGNOSIS — S0990XA Unspecified injury of head, initial encounter: Secondary | ICD-10-CM | POA: Insufficient documentation

## 2014-03-13 DIAGNOSIS — Z872 Personal history of diseases of the skin and subcutaneous tissue: Secondary | ICD-10-CM | POA: Insufficient documentation

## 2014-03-13 DIAGNOSIS — S0181XA Laceration without foreign body of other part of head, initial encounter: Secondary | ICD-10-CM

## 2014-03-13 MED ORDER — TETANUS-DIPHTH-ACELL PERTUSSIS 5-2.5-18.5 LF-MCG/0.5 IM SUSP
0.5000 mL | Freq: Once | INTRAMUSCULAR | Status: AC
  Administered 2014-03-13: 0.5 mL via INTRAMUSCULAR
  Filled 2014-03-13: qty 0.5

## 2014-03-13 NOTE — ED Notes (Signed)
Pt in CT.

## 2014-03-13 NOTE — ED Provider Notes (Signed)
CSN: [id number]     Arrival date & time 03/13/14  1937 History   First MD Initiated Contact with Patient 03/13/14 1947     Chief Complaint  Patient presents with  . Fall    HPI 80 year old female presents with a head injury after a fall. She resides at a nursing home. About one hour prior to arrival she was sitting in a wheelchair at the nursing home when she leaned forward and fell out of the wheelchair striking her face on the floor. She suffered moderate blood loss. She did not lose consciousness. EMS reports that she has been at her baseline mental status since the incident. At baseline she is confused, disoriented, occasionally combative. She has not voiced any complaints. She has not had any nausea or vomiting. She is moving all her extremities.  Patient is unable to provide any history due to her dementia.  I called and spoke to a nurse at the facility. He reports that he did not witness the fall but was told that she still 4 out of the wheelchair. He was told that she did not lose consciousness. He reports that she has fallen similarly several times in the past.  Later her daughter came to the emergency department. She also reported that the patient has had numerous falls at the facility.   Past Medical History  Diagnosis Date  . Malignant neoplasm of colon, unspecified site   . Anxiety state, unspecified   . Alzheimer's disease   . Unspecified essential hypertension   . Chronic airway obstruction, not elsewhere classified   . Barrett's esophagus   . Osteoarthrosis, unspecified whether generalized or localized, unspecified site   . Pain in joint, shoulder region   . Osteoporosis, unspecified   . Shortness of breath   . Flatulence, eructation, and gas pain   . Urinary frequency   . Hyperglycemia   . Pressure ulcer, heel(707.07)   . Other specified disease of white blood cells 02/24/2013  . Acute conjunctivitis, unspecified 02/24/2013  . Pneumonia, organism unspecified  02/24/2013  . Hypotension, unspecified 02/21/2013  . Acute bronchitis 02/21/2013  . Hemorrhage of gastrointestinal tract, unspecified 02/21/2013  . Altered mental status 02/21/2013  . Contusion of face, scalp, and neck except eye(s) 06/13/2012  . Unspecified fall 06/13/2012  . Hyposmolality and/or hyponatremia 05/17/2012  . Joint pain of lower extremity 04/15/2012  . Acute upper respiratory infections of unspecified site 04/24/2011  . Hordeolum externum 03/31/2011  . Contact dermatitis and other eczema, due to unspecified cause 09/26/2010  . Candidiasis of other urogenital sites 07/15/2010  . Rash and other nonspecific skin eruption 07/15/2010  . Unspecified vitamin D deficiency 05/07/2010  . Altered mental status 04/11/2010  . Closed fracture of distal end of ulna (alone) 04/12  . Other specified disease of nail 08/16/2009  . Disorders of bursae and tendons in shoulder region, unspecified 06/21/2009  . Pain in joint, pelvic region and thigh 04/12/2009  . Anemia, unspecified 04/05/2009  . Hip joint replacement by other means 03/23/2009  . Unspecified constipation 10/09/2008  . Other malaise and fatigue 11/09/2006  . Aortic aneurysm of unspecified site without mention of rupture 07/28/2005  . Osteoarthrosis, unspecified whether generalized or localized, unspecified site 07/28/2005  . Pain in joint, shoulder region 07/28/2005  . Osteoporosis, unspecified 07/28/2005  . Other emphysema 07/29/1999  . Raynaud's syndrome 07/28/1998   Past Surgical History  Procedure Laterality Date  . Dilation and curettage of uterus    . Colon surgery  2001  partial colon resecton for cancer  . Cataract extraction w/ intraocular lens  implant, bilateral  2004  . Total hip arthroplasty Left 2010   Family History  Problem Relation Age of Onset  . Stroke Mother    History  Substance Use Topics  . Smoking status: Never Smoker   . Smokeless tobacco: Not on file  . Alcohol Use: No   OB History    Grav Para Term Preterm Abortions TAB SAB Ect Mult Living                 Review of Systems  Unable to perform ROS: Dementia      Allergies  Morphine and related and Oxytrol  Home Medications   Current Outpatient Rx  Name  Route  Sig  Dispense  Refill  . acetaminophen (TYLENOL) 325 MG tablet   Oral   Take 650 mg by mouth 2 (two) times daily. *Also takes 650mg  every 6 hours as needed*         . Calcium-Vitamin D-Vitamin K (VIACTIV) [id number] MG-UNT-MCG CHEW   Oral   Chew 1 tablet by mouth daily.         . citalopram (CELEXA) 10 MG tablet   Oral   Take 5 mg by mouth daily. Take /12 daily to help nerves and depression.         . ergocalciferol (VITAMIN D2) 50000 UNITS capsule   Oral   Take 50,000 Units by mouth every 30 (thirty) days. *Takes on the 17th of each month*         . lactose free nutrition (BOOST) LIQD   Oral   Take 237 mLs by mouth daily.         Marland Kitchen lisinopril (PRINIVIL,ZESTRIL) 10 MG tablet   Oral   Take 10 mg by mouth daily.         Marland Kitchen LORazepam (ATIVAN) 0.5 MG tablet   Oral   Take 0.5 mg by mouth See admin instructions. Takes 0.5mg  daily in the morning and 0.25mg  twice as needed for anxiety throughout the day         . omeprazole (PRILOSEC) 20 MG capsule   Oral   Take 20 mg by mouth daily.          . polyethylene glycol (MIRALAX / GLYCOLAX) packet   Oral   Take 17 g by mouth daily.          BP 91/56  Pulse 91  Temp(Src) 97.5 F (36.4 C) (Oral)  Resp 20  SpO2 97% Physical Exam GENERAL: well developed, no acute distress, large laceration to anterior forehead HEENT: normocephalic.  PERRL.  EOMI.  Sclera normal.  Mucus membranes moist.  Oropharynx clear.  Large laceration with flap NECK: supple.  No JVD.  Normal ROM. CARDIOVASCULAR: heart regular of rate and rhythm.  Normal heart sounds with no murmur, gallop, or rub.  Intact, strong, and bilaterally equal distal pulses.  Skin warm and dry.  No peripheral edema. PULMONARY:  chest clear to auscultation bilaterally.  No wheezes, rales, or rhonci.  Normal work of breathing. ABDOMEN: soft, non-distended, non-tender.  No pulsatile mass. GU: deferred MUSCULOSKELETAL: atraumatic; no edema NEURO: alert and not oriented to person, place, time, or situation.  Non-verbal.  Moves all extremities equally.  Normal tone.   SKIN: warm and dry with no noted rash, abscess, or cellulitis. PSYCH: normal mood and affect; normal memory to recent events  ED Course  LACERATION REPAIR Date/Time: 03/14/2014 5:08 PM Performed by: Wendall Papa  Authorized by: Wendall Papa Consent: Verbal consent obtained. Consent given by: her daughter. Patient identity confirmed: arm band and hospital-assigned identification number Body area: head/neck Location details: scalp Laceration length: 8 cm Foreign bodies: no foreign bodies Tendon involvement: none Nerve involvement: none Vascular damage: no Anesthesia: local infiltration Local anesthetic: lidocaine 2% without epinephrine Patient sedated: no Preparation: Patient was prepped and draped in the usual sterile fashion. Irrigation solution: saline Amount of cleaning: standard Debridement: minimal Degree of undermining: none Skin closure: 4-0 nylon Number of sutures: 9 Technique: simple Approximation: loose Approximation difficulty: simple Patient tolerance: Patient tolerated the procedure well with no immediate complications.   (including critical care time) Labs Review Labs Reviewed - No data to display Imaging Review Ct Head Wo Contrast  03/13/2014   CLINICAL DATA:  Unwitnessed fall normal found on floor, frontal laceration, no loss of consciousness, history severe dementia, colon cancer, Alzheimer's, hypertension  EXAM: CT HEAD WITHOUT CONTRAST  CT CERVICAL SPINE WITHOUT CONTRAST  TECHNIQUE: Multidetector CT imaging of the head and cervical spine was performed following the standard protocol without intravenous contrast.  Multiplanar CT image reconstructions of the cervical spine were also generated.  COMPARISON:  CT head 06/13/2012  FINDINGS: CT HEAD FINDINGS  Generalized atrophy.  Normal ventricular morphology.  No midline shift or mass effect.  Small vessel chronic ischemic changes of deep cerebral white matter.  No intracranial hemorrhage, mass lesion, or acute infarction.  Visualized paranasal sinuses and mastoid air cells clear.  Bones unremarkable.  Asymmetric positioning in gantry.  Beam hardening artifacts secondary to artifacts within imaged field.  CT CERVICAL SPINE FINDINGS  Motion artifacts, for which repeat imaging was performed.  Diffuse osseous demineralization.  Multilevel disc space narrowing and endplate spur formation.  Mild scattered facet degenerative changes.  Prevertebral soft tissues normal thickness.  Visualized skullbase intact.  Vertebral body heights maintained without fracture or subluxation.  Biapical lung scarring.  IMPRESSION: Atrophy with small vessel chronic ischemic changes of deep cerebral white matter.  No acute intracranial abnormalities.  Multilevel degenerative disc and facet disease changes of the cervical spine.  No acute cervical spine abnormalities.   Electronically Signed   By: Lavonia Dana M.D.   On: 03/13/2014 21:09   Ct Cervical Spine Wo Contrast  03/13/2014   CLINICAL DATA:  Unwitnessed fall normal found on floor, frontal laceration, no loss of consciousness, history severe dementia, colon cancer, Alzheimer's, hypertension  EXAM: CT HEAD WITHOUT CONTRAST  CT CERVICAL SPINE WITHOUT CONTRAST  TECHNIQUE: Multidetector CT imaging of the head and cervical spine was performed following the standard protocol without intravenous contrast. Multiplanar CT image reconstructions of the cervical spine were also generated.  COMPARISON:  CT head 06/13/2012  FINDINGS: CT HEAD FINDINGS  Generalized atrophy.  Normal ventricular morphology.  No midline shift or mass effect.  Small vessel chronic  ischemic changes of deep cerebral white matter.  No intracranial hemorrhage, mass lesion, or acute infarction.  Visualized paranasal sinuses and mastoid air cells clear.  Bones unremarkable.  Asymmetric positioning in gantry.  Beam hardening artifacts secondary to artifacts within imaged field.  CT CERVICAL SPINE FINDINGS  Motion artifacts, for which repeat imaging was performed.  Diffuse osseous demineralization.  Multilevel disc space narrowing and endplate spur formation.  Mild scattered facet degenerative changes.  Prevertebral soft tissues normal thickness.  Visualized skullbase intact.  Vertebral body heights maintained without fracture or subluxation.  Biapical lung scarring.  IMPRESSION: Atrophy with small vessel chronic ischemic changes of deep cerebral white matter.  No acute intracranial abnormalities.  Multilevel degenerative disc and facet disease changes of the cervical spine.  No acute cervical spine abnormalities.   Electronically Signed   By: Lavonia Dana M.D.   On: 03/13/2014 21:09   Dg Pelvis Portable  03/13/2014   CLINICAL DATA:  Patient with dementia, fell out of wheelchair no bruises or abrasions  EXAM: PORTABLE PELVIS 1-2 VIEWS  COMPARISON:  DG PELVIS PORTABLE dated 04/14/2009  FINDINGS: Dynamic hip screw left femur stable. No pelvic bone fractures identified. Moderate bilateral hip arthritis is noted again.  IMPRESSION: No evidence of fracture   Electronically Signed   By: Skipper Cliche M.D.   On: 03/13/2014 21:35     EKG Interpretation None      MDM   80 yo F from nursing home. Mechanical fall while leaning forward in wheelchair.  Has had numerous similar falls at the nursing home. Spoke with nursing home staff.  She had been previously in her usual state of health.  Behaving at baseline (non-verbal, occasionally combative) since the fall.  No LOC.   Lac to forehead, uncomplicated, repaired with sutures.  Explored to base in bloodless field with no FB or complicating  features. CT head and C spine negative for acute injury. I do not suspect occult injury. I do not suspect syncope as underlying cause. I feel she is safe for discharge back to nursing home.  Sutures out in 6 days.  Discussed return precautions with her daughter.  Final diagnoses:  Fall  Facial laceration  Minor head injury        Wendall Papa, MD 03/14/14 1713

## 2014-03-13 NOTE — ED Provider Notes (Signed)
Level V caveat Patient leaned forward in her wheelchair striking her head. Creating laceration at 4 head. There is a V-shaped laceration at her forehead. No active bleeding patient is alert does not follow some commands, moves all extremities. Next supple chest is nontender abdomen pelvis nontender all 4 extremities a contusion abrasion or tenderness neurovascularly intact  Orlie Dakin, MD 03/13/14 2041

## 2014-03-13 NOTE — ED Notes (Signed)
Pt is from the [place] at [facility]. Staff report an unwitnessed fall. Pt was seated in a wheelchair, and when staff came back to check on her, she was found on the floor. EMS reports a large laceration on the forehead, that is currently wrapped in gauze. Pt with unknown LOC, pt has hx of severe dementia, and is verbal, but non-coherent, which is her baseline per EMS. EMS states that the pt would not tolerate the C-collar, so EMS wrapped a towel around the pt's neck in an attempt to reduce movement.

## 2014-03-15 NOTE — ED Provider Notes (Addendum)
I have personally seen and examined the patient.  I have discussed the plan of care with the resident.  I have reviewed the documentation on PMH/FH/Soc. History.  I have reviewed the documentation of the resident and agree.  Orlie Dakin, MD 03/15/14 1548 I was present in the emergency dept during time in which the laceration was repaired by the resident physician and immediately available for consultation  Orlie Dakin, MD 03/27/14 [phone]

## 2014-03-19 ENCOUNTER — Encounter: Payer: Self-pay | Admitting: Nurse Practitioner

## 2014-03-19 ENCOUNTER — Non-Acute Institutional Stay (SKILLED_NURSING_FACILITY): Payer: Medicare Other | Admitting: Nurse Practitioner

## 2014-03-19 DIAGNOSIS — S0181XA Laceration without foreign body of other part of head, initial encounter: Secondary | ICD-10-CM

## 2014-03-19 DIAGNOSIS — K219 Gastro-esophageal reflux disease without esophagitis: Secondary | ICD-10-CM

## 2014-03-19 DIAGNOSIS — W19XXXA Unspecified fall, initial encounter: Secondary | ICD-10-CM

## 2014-03-19 DIAGNOSIS — F411 Generalized anxiety disorder: Secondary | ICD-10-CM

## 2014-03-19 DIAGNOSIS — K5900 Constipation, unspecified: Secondary | ICD-10-CM

## 2014-03-19 DIAGNOSIS — Y92129 Unspecified place in nursing home as the place of occurrence of the external cause: Secondary | ICD-10-CM

## 2014-03-19 DIAGNOSIS — F0390 Unspecified dementia without behavioral disturbance: Secondary | ICD-10-CM

## 2014-03-19 DIAGNOSIS — I10 Essential (primary) hypertension: Secondary | ICD-10-CM

## 2014-03-19 DIAGNOSIS — S0180XA Unspecified open wound of other part of head, initial encounter: Secondary | ICD-10-CM

## 2014-03-19 NOTE — Assessment & Plan Note (Addendum)
B/c lack of safety awareness due to her advanced dementia. Intensive supervision needed for safety.

## 2014-03-19 NOTE — Assessment & Plan Note (Addendum)
Continue supportive care in SNF and off all memory preserving medications. Progressing gradually. Total care of her ADLs. She is unable to voice her needs. She seems happy to see her sister. Will update CBC, CMP, TSH, Cath UA C/S

## 2014-03-19 NOTE — Assessment & Plan Note (Signed)
Controlled and continue with Lisinopril 10mg  

## 2014-03-19 NOTE — Assessment & Plan Note (Signed)
Stable on Omeprazole 20 mg daily.

## 2014-03-19 NOTE — Assessment & Plan Note (Signed)
Stable on MiraLax daily.  

## 2014-03-19 NOTE — Assessment & Plan Note (Signed)
Manageable with Celexa 5mg and Ativan 0.5mg qam/0.25mg bid prn.  

## 2014-03-19 NOTE — Progress Notes (Signed)
Patient ID: [name], female   DOB: [birth year], 80 y.o.   MRN: [id number]   Code Status: DNR  Allergies  Allergen Reactions  . Morphine And Related     Per MAR  . Oxytrol [Oxybutynin]     Per [facility]    Chief Complaint  Patient presents with  . Medical Managment of Chronic Issues  . Acute Visit    left forehead laceration.    HPI: Patient is a 80 y.o. female seen in the SNF at [facility] today for evaluation of left forehead laceration sustained from fall 03/13/14, suture closure place at ED,  and chronic medical conditions.  Problem List Items Addressed This Visit   Anxiety state, unspecified     Manageable with Celexa 5mg  and Ativan 0.5mg  qam/0.25mg  bid prn.      Dementia     Continue supportive care in SNF and off all memory preserving medications. Progressing gradually. Total care of her ADLs. She is unable to voice her needs. She seems happy to see her sister. Will update CBC, CMP, TSH, Cath UA C/S     Unspecified constipation     Stable on MiraLax daily    GERD (gastroesophageal reflux disease)     Stable on Omeprazole 20mg  daily.       Unspecified essential hypertension     Controlled and continue with Lisinopril 10mg       Laceration of left side of forehead with complication - Primary     Sustained from fall 03/13/14. Suture closure required and placed in ED. Healing nicely. Anticipated removal date 03/21/14. No focal neurological deficit upon today's examination.     Fall at nursing home     B/c lack of safety awareness due to her advanced dementia. Intensive supervision needed for safety.        Review of Systems:  Review of Systems  Constitutional: Negative for fever and weight loss.       Itching legs with scratched marks. Right lateral lower leg rash. Nape rash-resolved.    HENT: Negative for congestion and hearing loss.   Eyes: Negative.        Dry eyes.   Respiratory: Negative for cough, sputum production and wheezing.     Cardiovascular: Negative for chest pain, orthopnea and leg swelling.  Gastrointestinal: Negative for nausea, vomiting, abdominal pain, diarrhea and constipation.  Genitourinary: Positive for frequency (incontinent of bladder) and pelvic pain. Negative for urgency and flank pain.  Musculoskeletal: Positive for falls (if not supervised). Negative for back pain, joint pain and neck pain.       Last fall 03/13/14 resulted in left forehead laceration.   Skin: Negative for itching and rash.       Left forehead laceration with suture closure  Neurological: Negative for tremors, speech change, focal weakness, seizures, loss of consciousness and weakness.  Endo/Heme/Allergies: Negative for polydipsia. Does not bruise/bleed easily.  Psychiatric/Behavioral: Positive for memory loss. Negative for depression and hallucinations. The patient is nervous/anxious (more freqeunt restlessness). The patient does not have insomnia.      Past Medical History  Diagnosis Date  . Malignant neoplasm of colon, unspecified site   . Anxiety state, unspecified   . Alzheimer's disease   . Unspecified essential hypertension   . Chronic airway obstruction, not elsewhere classified   . Barrett's esophagus   . Osteoarthrosis, unspecified whether generalized or localized, unspecified site   . Pain in joint, shoulder region   . Osteoporosis, unspecified   . Shortness of breath   .  Flatulence, eructation, and gas pain   . Urinary frequency   . Hyperglycemia   . Pressure ulcer, heel(707.07)   . Other specified disease of white blood cells 02/24/2013  . Acute conjunctivitis, unspecified 02/24/2013  . Pneumonia, organism unspecified 02/24/2013  . Hypotension, unspecified 02/21/2013  . Acute bronchitis 02/21/2013  . Hemorrhage of gastrointestinal tract, unspecified 02/21/2013  . Altered mental status 02/21/2013  . Contusion of face, scalp, and neck except eye(s) 06/13/2012  . Unspecified fall 06/13/2012  . Hyposmolality  and/or hyponatremia 05/17/2012  . Joint pain of lower extremity 04/15/2012  . Acute upper respiratory infections of unspecified site 04/24/2011  . Hordeolum externum 03/31/2011  . Contact dermatitis and other eczema, due to unspecified cause 09/26/2010  . Candidiasis of other urogenital sites 07/15/2010  . Rash and other nonspecific skin eruption 07/15/2010  . Unspecified vitamin D deficiency 05/07/2010  . Altered mental status 04/11/2010  . Closed fracture of distal end of ulna (alone) 04/12  . Other specified disease of nail 08/16/2009  . Disorders of bursae and tendons in shoulder region, unspecified 06/21/2009  . Pain in joint, pelvic region and thigh 04/12/2009  . Anemia, unspecified 04/05/2009  . Hip joint replacement by other means 03/23/2009  . Unspecified constipation 10/09/2008  . Other malaise and fatigue 11/09/2006  . Aortic aneurysm of unspecified site without mention of rupture 07/28/2005  . Osteoarthrosis, unspecified whether generalized or localized, unspecified site 07/28/2005  . Pain in joint, shoulder region 07/28/2005  . Osteoporosis, unspecified 07/28/2005  . Other emphysema 07/29/1999  . Raynaud's syndrome 07/28/1998   Past Surgical History  Procedure Laterality Date  . Dilation and curettage of uterus    . Colon surgery  2001    partial colon resecton for cancer  . Cataract extraction w/ intraocular lens  implant, bilateral  2004  . Total hip arthroplasty Left 2010   Social History:   reports that she has never smoked. She does not have any smokeless tobacco history on file. She reports that she does not drink alcohol. Her drug history is not on file.  Family History  Problem Relation Age of Onset  . Stroke Mother     Medications: Patient's Medications  New Prescriptions   No medications on file  Previous Medications   ACETAMINOPHEN (TYLENOL) 325 MG TABLET    Take 650 mg by mouth 2 (two) times daily. *Also takes 650mg  every 6 hours as needed*    CALCIUM-VITAMIN D-VITAMIN K (VIACTIV) 220-254-27 MG-UNT-MCG CHEW    Chew 1 tablet by mouth daily.   CITALOPRAM (CELEXA) 10 MG TABLET    Take 5 mg by mouth daily. Take /12 daily to help nerves and depression.   ERGOCALCIFEROL (VITAMIN D2) 50000 UNITS CAPSULE    Take 50,000 Units by mouth every 30 (thirty) days. *Takes on the 17th of each month*   LACTOSE FREE NUTRITION (BOOST) LIQD    Take 237 mLs by mouth daily.   LISINOPRIL (PRINIVIL,ZESTRIL) 10 MG TABLET    Take 10 mg by mouth daily.   LORAZEPAM (ATIVAN) 0.5 MG TABLET    Take 0.5 mg by mouth See admin instructions. Takes 0.5mg  daily in the morning and 0.25mg  twice as needed for anxiety throughout the day   OMEPRAZOLE (PRILOSEC) 20 MG CAPSULE    Take 20 mg by mouth daily.    POLYETHYLENE GLYCOL (MIRALAX / GLYCOLAX) PACKET    Take 17 g by mouth daily.  Modified Medications   No medications on file  Discontinued Medications   No  medications on file     Physical Exam: Physical Exam  Constitutional: She appears well-developed and well-nourished.  HENT:  Head: Normocephalic and atraumatic.  Eyes: Conjunctivae and EOM are normal. Pupils are equal, round, and reactive to light. Right eye exhibits no discharge. Left eye exhibits no discharge. No scleral icterus.  Neck: Normal range of motion. Neck supple. No JVD present. No thyromegaly present.  Cardiovascular: Normal rate, regular rhythm and normal heart sounds.   No murmur heard. Pulmonary/Chest: Effort normal. She has no wheezes. She has rales (bibasilar dry rales appreciated).  Abdominal: Soft. Bowel sounds are normal. There is no tenderness.  Musculoskeletal: Normal range of motion. She exhibits no edema and no tenderness.  Neurological: She is alert. No cranial nerve deficit. She exhibits normal muscle tone. Coordination normal.  Skin: Skin is warm and dry. No rash noted.  Left forehead laceration with suture closure-healing nicely-removal 03/21/14   Psychiatric: Her mood appears  anxious. Her affect is labile. Her affect is not angry. Her speech is tangential. Her speech is not slurred. She is agitated (when assisted with peronal care) and combative (when she doesn't understad what you want her to do). She is not aggressive, not hyperactive and not withdrawn. Thought content is not paranoid and not delusional. Cognition and memory are impaired. She expresses impulsivity and inappropriate judgment. She does not exhibit a depressed mood. She exhibits abnormal recent memory and abnormal remote memory.    Filed Vitals:   03/19/14 1024  BP: 128/70  Pulse: 72  Temp: 97.4 F (36.3 C)  TempSrc: Tympanic  Resp: 18      Labs reviewed: Basic Metabolic Panel:  Recent Labs  08/01/13  NA 140  K 3.8  BUN 25*  CREATININE 0.9  TSH 0.80   Liver Function Tests:  Recent Labs  08/01/13  AST 13  ALT 9  ALKPHOS 97   CBC:  Recent Labs  08/01/13  WBC 7.8  HGB 12.5  HCT 37  PLT 326   Assessment/Plan Laceration of left side of forehead with complication Sustained from fall 03/13/14. Suture closure required and placed in ED. Healing nicely. Anticipated removal date 03/21/14. No focal neurological deficit upon today's examination.   Fall at nursing home B/c lack of safety awareness due to her advanced dementia. Intensive supervision needed for safety.   Dementia Continue supportive care in SNF and off all memory preserving medications. Progressing gradually. Total care of her ADLs. She is unable to voice her needs. She seems happy to see her sister. Will update CBC, CMP, TSH, Cath UA C/S   Anxiety state, unspecified Manageable with Celexa 5mg  and Ativan 0.5mg  qam/0.25mg  bid prn.    Unspecified constipation Stable on MiraLax daily  GERD (gastroesophageal reflux disease) Stable on Omeprazole 20mg  daily.     Unspecified essential hypertension Controlled and continue with Lisinopril 10mg       Family/ Staff Communication: observe the patient  Goals of  Care: SNF  Labs/tests ordered: CBC, CMP, TSH, Cath UA C/S

## 2014-03-19 NOTE — Assessment & Plan Note (Addendum)
Sustained from fall 03/13/14. Suture closure required and placed in ED. Healing nicely. Anticipated removal date 03/21/14. No focal neurological deficit upon today's examination.

## 2014-03-20 LAB — CBC AND DIFFERENTIAL
HCT: 34 % — AB (ref 36–46)
Hemoglobin: 11.8 g/dL — AB (ref 12.0–16.0)
Platelets: 288 10*3/uL (ref 150–399)
WBC: 6.2 10^3/mL

## 2014-03-20 LAB — TSH: TSH: 0.74 u[IU]/mL (ref 0.41–5.90)

## 2014-03-20 LAB — HEPATIC FUNCTION PANEL
ALT: 8 U/L (ref 7–35)
AST: 12 U/L — AB (ref 13–35)
Alkaline Phosphatase: 101 U/L (ref 25–125)
BILIRUBIN, TOTAL: 0.3 mg/dL

## 2014-03-20 LAB — BASIC METABOLIC PANEL
BUN: 26 mg/dL — AB (ref 4–21)
CREATININE: 0.8 mg/dL (ref 0.5–1.1)
GLUCOSE: 79 mg/dL
POTASSIUM: 4.4 mmol/L (ref 3.4–5.3)
SODIUM: 137 mmol/L (ref 137–147)

## 2014-03-26 ENCOUNTER — Non-Acute Institutional Stay (SKILLED_NURSING_FACILITY): Payer: Medicare Other | Admitting: Nurse Practitioner

## 2014-03-26 ENCOUNTER — Encounter: Payer: Self-pay | Admitting: Nurse Practitioner

## 2014-03-26 DIAGNOSIS — G309 Alzheimer's disease, unspecified: Secondary | ICD-10-CM

## 2014-03-26 DIAGNOSIS — K2270 Barrett's esophagus without dysplasia: Secondary | ICD-10-CM

## 2014-03-26 DIAGNOSIS — N3000 Acute cystitis without hematuria: Secondary | ICD-10-CM | POA: Insufficient documentation

## 2014-03-26 DIAGNOSIS — I10 Essential (primary) hypertension: Secondary | ICD-10-CM

## 2014-03-26 DIAGNOSIS — F0280 Dementia in other diseases classified elsewhere without behavioral disturbance: Secondary | ICD-10-CM

## 2014-03-26 DIAGNOSIS — M1990 Unspecified osteoarthritis, unspecified site: Secondary | ICD-10-CM

## 2014-03-26 DIAGNOSIS — F411 Generalized anxiety disorder: Secondary | ICD-10-CM

## 2014-03-26 DIAGNOSIS — K5900 Constipation, unspecified: Secondary | ICD-10-CM

## 2014-03-26 DIAGNOSIS — N390 Urinary tract infection, site not specified: Secondary | ICD-10-CM

## 2014-03-26 NOTE — Assessment & Plan Note (Signed)
Stable on MiraLax daily.  

## 2014-03-26 NOTE — Assessment & Plan Note (Signed)
Controlled and continue with Lisinopril 10mg  

## 2014-03-26 NOTE — Progress Notes (Signed)
Patient ID: [name], female   DOB: 03-Apr-1917, 80 y.o.   MRN: [id number]   Code Status: DNR  Allergies  Allergen Reactions  . Morphine And Related     Per MAR  . Oxytrol [Oxybutynin]     Per [facility]    Chief Complaint  Patient presents with  . Medical Managment of Chronic Issues  . Acute Visit    UTI    HPI: Patient is a 80 y.o. female seen in the SNF at [facility] today for evaluation of UTI, healed left forehead laceration sustained from fall 03/13/14, suture closure place at ED-healed and removed,  and chronic medical conditions.  Problem List Items Addressed This Visit   Alzheimer's disease     Continue supportive care in SNF and off all memory preserving medications      Anxiety state, unspecified     Manageable with Celexa 5mg  and Ativan 0.5mg  qam/0.25mg  bid prn.     Barrett's esophagus     Stable on Omeprazole 20mg  daily.      Osteoarthrosis, unspecified whether generalized or localized, unspecified site     Tylenol 650mg  bid is adequate.      Unspecified constipation     Stable on MiraLax daily     Unspecified essential hypertension     Controlled and continue with Lisinopril 10mg      Urinary tract infection, site not specified - Primary     Urine culture 03/24/14 S. Coag Neg Staphylococcus Septra DS bid for 7 days started 03/24/14. Tolerated well so far.        Review of Systems:  Review of Systems  Constitutional: Negative for fever and weight loss.       Itching legs with scratched marks. Right lateral lower leg rash. Nape rash-resolved.    HENT: Negative for congestion and hearing loss.   Eyes: Negative.        Dry eyes.   Respiratory: Negative for cough, sputum production and wheezing.   Cardiovascular: Negative for chest pain, orthopnea and leg swelling.  Gastrointestinal: Negative for nausea, vomiting, abdominal pain, diarrhea and constipation.  Genitourinary: Positive for frequency (incontinent of bladder) and pelvic pain. Negative  for urgency and flank pain.  Musculoskeletal: Positive for falls (if not supervised). Negative for back pain, joint pain and neck pain.       Last fall 03/13/14 resulted in left forehead laceration.   Skin: Negative for itching and rash.       Left forehead laceration with suture closure-removed and healed.   Neurological: Negative for tremors, speech change, focal weakness, seizures, loss of consciousness and weakness.  Endo/Heme/Allergies: Negative for polydipsia. Does not bruise/bleed easily.  Psychiatric/Behavioral: Positive for memory loss. Negative for depression and hallucinations. The patient is nervous/anxious (more freqeunt restlessness). The patient does not have insomnia.      Past Medical History  Diagnosis Date  . Malignant neoplasm of colon, unspecified site   . Anxiety state, unspecified   . Alzheimer's disease   . Unspecified essential hypertension   . Chronic airway obstruction, not elsewhere classified   . Barrett's esophagus   . Osteoarthrosis, unspecified whether generalized or localized, unspecified site   . Pain in joint, shoulder region   . Osteoporosis, unspecified   . Shortness of breath   . Flatulence, eructation, and gas pain   . Urinary frequency   . Hyperglycemia   . Pressure ulcer, heel(707.07)   . Other specified disease of white blood cells 02/24/2013  . Acute conjunctivitis, unspecified  02/24/2013  . Pneumonia, organism unspecified 02/24/2013  . Hypotension, unspecified 02/21/2013  . Acute bronchitis 02/21/2013  . Hemorrhage of gastrointestinal tract, unspecified 02/21/2013  . Altered mental status 02/21/2013  . Contusion of face, scalp, and neck except eye(s) 06/13/2012  . Unspecified fall 06/13/2012  . Hyposmolality and/or hyponatremia 05/17/2012  . Joint pain of lower extremity 04/15/2012  . Acute upper respiratory infections of unspecified site 04/24/2011  . Hordeolum externum 03/31/2011  . Contact dermatitis and other eczema, due to  unspecified cause 09/26/2010  . Candidiasis of other urogenital sites 07/15/2010  . Rash and other nonspecific skin eruption 07/15/2010  . Unspecified vitamin D deficiency 05/07/2010  . Altered mental status 04/11/2010  . Closed fracture of distal end of ulna (alone) 04/12  . Other specified disease of nail 08/16/2009  . Disorders of bursae and tendons in shoulder region, unspecified 06/21/2009  . Pain in joint, pelvic region and thigh 04/12/2009  . Anemia, unspecified 04/05/2009  . Hip joint replacement by other means 03/23/2009  . Unspecified constipation 10/09/2008  . Other malaise and fatigue 11/09/2006  . Aortic aneurysm of unspecified site without mention of rupture 07/28/2005  . Osteoarthrosis, unspecified whether generalized or localized, unspecified site 07/28/2005  . Pain in joint, shoulder region 07/28/2005  . Osteoporosis, unspecified 07/28/2005  . Other emphysema 07/29/1999  . Raynaud's syndrome 07/28/1998   Past Surgical History  Procedure Laterality Date  . Dilation and curettage of uterus    . Colon surgery  2001    partial colon resecton for cancer  . Cataract extraction w/ intraocular lens  implant, bilateral  2004  . Total hip arthroplasty Left 2010   Social History:   reports that she has never smoked. She does not have any smokeless tobacco history on file. She reports that she does not drink alcohol. Her drug history is not on file.  Family History  Problem Relation Age of Onset  . Stroke Mother     Medications: Patient's Medications  New Prescriptions   No medications on file  Previous Medications   ACETAMINOPHEN (TYLENOL) 325 MG TABLET    Take 650 mg by mouth 2 (two) times daily. *Also takes 650mg  every 6 hours as needed*   CALCIUM-VITAMIN D-VITAMIN K (VIACTIV) 458-099-83 MG-UNT-MCG CHEW    Chew 1 tablet by mouth daily.   CITALOPRAM (CELEXA) 10 MG TABLET    Take 5 mg by mouth daily. Take /12 daily to help nerves and depression.   ERGOCALCIFEROL  (VITAMIN D2) 50000 UNITS CAPSULE    Take 50,000 Units by mouth every 30 (thirty) days. *Takes on the 17th of each month*   LACTOSE FREE NUTRITION (BOOST) LIQD    Take 237 mLs by mouth daily.   LISINOPRIL (PRINIVIL,ZESTRIL) 10 MG TABLET    Take 10 mg by mouth daily.   LORAZEPAM (ATIVAN) 0.5 MG TABLET    Take 0.5 mg by mouth See admin instructions. Takes 0.5mg  daily in the morning and 0.25mg  twice as needed for anxiety throughout the day   OMEPRAZOLE (PRILOSEC) 20 MG CAPSULE    Take 20 mg by mouth daily.    POLYETHYLENE GLYCOL (MIRALAX / GLYCOLAX) PACKET    Take 17 g by mouth daily.  Modified Medications   No medications on file  Discontinued Medications   No medications on file     Physical Exam: Physical Exam  Constitutional: She appears well-developed and well-nourished.  HENT:  Head: Normocephalic and atraumatic.  Eyes: Conjunctivae and EOM are normal. Pupils are equal, round,  and reactive to light. Right eye exhibits no discharge. Left eye exhibits no discharge. No scleral icterus.  Neck: Normal range of motion. Neck supple. No JVD present. No thyromegaly present.  Cardiovascular: Normal rate, regular rhythm and normal heart sounds.   No murmur heard. Pulmonary/Chest: Effort normal. She has no wheezes. She has rales (bibasilar dry rales appreciated).  Abdominal: Soft. Bowel sounds are normal. There is no tenderness.  Musculoskeletal: Normal range of motion. She exhibits no edema and no tenderness.  Neurological: She is alert. No cranial nerve deficit. She exhibits normal muscle tone. Coordination normal.  Skin: Skin is warm and dry. No rash noted.  Left forehead laceration with suture closure-healing nicely-removed 03/21/14 and healed.    Psychiatric: Her mood appears anxious. Her affect is labile. Her affect is not angry. Her speech is tangential. Her speech is not slurred. She is agitated (when assisted with peronal care) and combative (when she doesn't understad what you want her to  do). She is not aggressive, not hyperactive and not withdrawn. Thought content is not paranoid and not delusional. Cognition and memory are impaired. She expresses impulsivity and inappropriate judgment. She does not exhibit a depressed mood. She exhibits abnormal recent memory and abnormal remote memory.    Filed Vitals:   03/26/14 1020  BP: 128/70  Pulse: 72  Temp: 97.6 F (36.4 C)  TempSrc: Tympanic  Resp: 18      Labs reviewed: Basic Metabolic Panel:  Recent Labs  08/01/13 03/20/14  NA 140 137  K 3.8 4.4  BUN 25* 26*  CREATININE 0.9 0.8  TSH 0.80 0.74   Liver Function Tests:  Recent Labs  08/01/13 03/20/14  AST 13 12*  ALT 9 8  ALKPHOS 97 101   CBC:  Recent Labs  08/01/13 03/20/14  WBC 7.8 6.2  HGB 12.5 11.8*  HCT 37 34*  PLT 326 288   Assessment/Plan Urinary tract infection, site not specified Urine culture 03/24/14 S. Coag Neg Staphylococcus Septra DS bid for 7 days started 03/24/14. Tolerated well so far.   Alzheimer's disease Continue supportive care in SNF and off all memory preserving medications    Anxiety state, unspecified Manageable with Celexa 5mg  and Ativan 0.5mg  qam/0.25mg  bid prn.   Barrett's esophagus Stable on Omeprazole 20mg  daily.    Unspecified essential hypertension Controlled and continue with Lisinopril 10mg    Unspecified constipation Stable on MiraLax daily   Osteoarthrosis, unspecified whether generalized or localized, unspecified site Tylenol 650mg  bid is adequate.      Family/ Staff Communication: observe the patient  Goals of Care: SNF  Labs/tests ordered: none

## 2014-03-26 NOTE — Assessment & Plan Note (Signed)
Continue supportive care in SNF and off all memory preserving medications   

## 2014-03-26 NOTE — Assessment & Plan Note (Signed)
Stable on Omeprazole 20 mg daily.

## 2014-03-26 NOTE — Assessment & Plan Note (Signed)
Urine culture 03/24/14 S. Coag Neg Staphylococcus Septra DS bid for 7 days started 03/24/14. Tolerated well so far.

## 2014-03-26 NOTE — Assessment & Plan Note (Signed)
Manageable with Celexa 5mg and Ativan 0.5mg qam/0.25mg bid prn.  

## 2014-03-26 NOTE — Assessment & Plan Note (Signed)
Tylenol 650mg bid is adequate.  

## 2014-04-18 ENCOUNTER — Encounter: Payer: Self-pay | Admitting: Nurse Practitioner

## 2014-04-18 ENCOUNTER — Non-Acute Institutional Stay (SKILLED_NURSING_FACILITY): Payer: Medicare Other | Admitting: Nurse Practitioner

## 2014-04-18 DIAGNOSIS — K5900 Constipation, unspecified: Secondary | ICD-10-CM

## 2014-04-18 DIAGNOSIS — F411 Generalized anxiety disorder: Secondary | ICD-10-CM

## 2014-04-18 DIAGNOSIS — I10 Essential (primary) hypertension: Secondary | ICD-10-CM

## 2014-04-18 DIAGNOSIS — M25519 Pain in unspecified shoulder: Secondary | ICD-10-CM

## 2014-04-18 DIAGNOSIS — F0280 Dementia in other diseases classified elsewhere without behavioral disturbance: Secondary | ICD-10-CM

## 2014-04-18 DIAGNOSIS — M1990 Unspecified osteoarthritis, unspecified site: Secondary | ICD-10-CM

## 2014-04-18 DIAGNOSIS — K2270 Barrett's esophagus without dysplasia: Secondary | ICD-10-CM

## 2014-04-18 DIAGNOSIS — N390 Urinary tract infection, site not specified: Secondary | ICD-10-CM

## 2014-04-18 DIAGNOSIS — G309 Alzheimer's disease, unspecified: Secondary | ICD-10-CM

## 2014-04-18 NOTE — Assessment & Plan Note (Signed)
Tylenol 650mg bid is adequate.  

## 2014-04-18 NOTE — Assessment & Plan Note (Signed)
Stable on MiraLax daily.  

## 2014-04-18 NOTE — Assessment & Plan Note (Signed)
Manageable with Celexa 5mg and Ativan 0.5mg qam/0.25mg bid prn.  

## 2014-04-18 NOTE — Assessment & Plan Note (Signed)
Controlled and continue with Lisinopril 10mg  

## 2014-04-18 NOTE — Assessment & Plan Note (Signed)
Tylenol 650mg  bid prn is adequate.

## 2014-04-18 NOTE — Progress Notes (Signed)
Patient ID: [name], female   DOB: 01/15/1917, 80 y.o.   MRN: [id number]   Code Status: DNR  Allergies  Allergen Reactions  . Morphine And Related     Per MAR  . Oxytrol [Oxybutynin]     Per [facility]    Chief Complaint  Patient presents with  . Medical Management of Chronic Issues    HPI: Patient is a 80 y.o. female seen in the SNF at [facility] today for evaluation of chronic medical conditions.  Problem List Items Addressed This Visit   Alzheimer's disease - Primary     Continue supportive care in SNF and off all memory preserving medications      Anxiety state, unspecified     Manageable with Celexa 5mg  and Ativan 0.5mg  qam/0.25mg  bid prn.      Barrett's esophagus     Stable on Omeprazole 20mg  daily.       Osteoarthrosis, unspecified whether generalized or localized, unspecified site     Tylenol 650mg  bid is adequate.       Pain in joint, shoulder region     Tylenol 650mg  bid prn is adequate.      Unspecified constipation     Stable on MiraLax daily    Unspecified essential hypertension     Controlled and continue with Lisinopril 10mg      Urinary tract infection, site not specified     Fully treated and healed clinically.        Review of Systems:  Review of Systems  Constitutional: Negative for fever and weight loss.       Itching legs with scratched marks. Right lateral lower leg rash. Nape rash-resolved.    HENT: Negative for congestion and hearing loss.   Eyes: Negative.        Dry eyes.   Respiratory: Negative for cough, sputum production and wheezing.   Cardiovascular: Negative for chest pain, orthopnea and leg swelling.  Gastrointestinal: Negative for nausea, vomiting, abdominal pain, diarrhea and constipation.  Genitourinary: Positive for frequency (incontinent of bladder) and pelvic pain. Negative for urgency and flank pain.  Musculoskeletal: Positive for falls (if not supervised). Negative for back pain, joint pain and neck  pain.       Last fall 03/13/14 resulted in left forehead laceration.   Skin: Negative for itching and rash.       Left forehead laceration with suture closure-removed and healed.   Neurological: Negative for tremors, speech change, focal weakness, seizures, loss of consciousness and weakness.  Endo/Heme/Allergies: Negative for polydipsia. Does not bruise/bleed easily.  Psychiatric/Behavioral: Positive for memory loss. Negative for depression and hallucinations. The patient is nervous/anxious (more freqeunt restlessness). The patient does not have insomnia.      Past Medical History  Diagnosis Date  . Malignant neoplasm of colon, unspecified site   . Anxiety state, unspecified   . Alzheimer's disease   . Unspecified essential hypertension   . Chronic airway obstruction, not elsewhere classified   . Barrett's esophagus   . Osteoarthrosis, unspecified whether generalized or localized, unspecified site   . Pain in joint, shoulder region   . Osteoporosis, unspecified   . Shortness of breath   . Flatulence, eructation, and gas pain   . Urinary frequency   . Hyperglycemia   . Pressure ulcer, heel(707.07)   . Other specified disease of white blood cells 02/24/2013  . Acute conjunctivitis, unspecified 02/24/2013  . Pneumonia, organism unspecified 02/24/2013  . Hypotension, unspecified 02/21/2013  . Acute bronchitis 02/21/2013  .  Hemorrhage of gastrointestinal tract, unspecified 02/21/2013  . Altered mental status 02/21/2013  . Contusion of face, scalp, and neck except eye(s) 06/13/2012  . Unspecified fall 06/13/2012  . Hyposmolality and/or hyponatremia 05/17/2012  . Joint pain of lower extremity 04/15/2012  . Acute upper respiratory infections of unspecified site 04/24/2011  . Hordeolum externum 03/31/2011  . Contact dermatitis and other eczema, due to unspecified cause 09/26/2010  . Candidiasis of other urogenital sites 07/15/2010  . Rash and other nonspecific skin eruption 07/15/2010   . Unspecified vitamin D deficiency 05/07/2010  . Altered mental status 04/11/2010  . Closed fracture of distal end of ulna (alone) 04/12  . Other specified disease of nail 08/16/2009  . Disorders of bursae and tendons in shoulder region, unspecified 06/21/2009  . Pain in joint, pelvic region and thigh 04/12/2009  . Anemia, unspecified 04/05/2009  . Hip joint replacement by other means 03/23/2009  . Unspecified constipation 10/09/2008  . Other malaise and fatigue 11/09/2006  . Aortic aneurysm of unspecified site without mention of rupture 07/28/2005  . Osteoarthrosis, unspecified whether generalized or localized, unspecified site 07/28/2005  . Pain in joint, shoulder region 07/28/2005  . Osteoporosis, unspecified 07/28/2005  . Other emphysema 07/29/1999  . Raynaud's syndrome 07/28/1998   Past Surgical History  Procedure Laterality Date  . Dilation and curettage of uterus    . Colon surgery  2001    partial colon resecton for cancer  . Cataract extraction w/ intraocular lens  implant, bilateral  2004  . Total hip arthroplasty Left 2010   Social History:   reports that she has never smoked. She does not have any smokeless tobacco history on file. She reports that she does not drink alcohol. Her drug history is not on file.  Family History  Problem Relation Age of Onset  . Stroke Mother     Medications: Patient's Medications  New Prescriptions   No medications on file  Previous Medications   ACETAMINOPHEN (TYLENOL) 325 MG TABLET    Take 650 mg by mouth 2 (two) times daily. *Also takes 650mg  every 6 hours as needed*   CALCIUM-VITAMIN D-VITAMIN K (VIACTIV) 601-093-23 MG-UNT-MCG CHEW    Chew 1 tablet by mouth daily.   CITALOPRAM (CELEXA) 10 MG TABLET    Take 5 mg by mouth daily. Take /12 daily to help nerves and depression.   ERGOCALCIFEROL (VITAMIN D2) 50000 UNITS CAPSULE    Take 50,000 Units by mouth every 30 (thirty) days. *Takes on the 17th of each month*   LACTOSE FREE  NUTRITION (BOOST) LIQD    Take 237 mLs by mouth daily.   LISINOPRIL (PRINIVIL,ZESTRIL) 10 MG TABLET    Take 10 mg by mouth daily.   LORAZEPAM (ATIVAN) 0.5 MG TABLET    Take 0.5 mg by mouth See admin instructions. Takes 0.5mg  daily in the morning and 0.25mg  twice as needed for anxiety throughout the day   OMEPRAZOLE (PRILOSEC) 20 MG CAPSULE    Take 20 mg by mouth daily.    POLYETHYLENE GLYCOL (MIRALAX / GLYCOLAX) PACKET    Take 17 g by mouth daily.  Modified Medications   No medications on file  Discontinued Medications   No medications on file     Physical Exam: Physical Exam  Constitutional: She appears well-developed and well-nourished.  HENT:  Head: Normocephalic and atraumatic.  Eyes: Conjunctivae and EOM are normal. Pupils are equal, round, and reactive to light. Right eye exhibits no discharge. Left eye exhibits no discharge. No scleral icterus.  Neck:  Normal range of motion. Neck supple. No JVD present. No thyromegaly present.  Cardiovascular: Normal rate, regular rhythm and normal heart sounds.   No murmur heard. Pulmonary/Chest: Effort normal. She has no wheezes. She has rales (bibasilar dry rales appreciated).  Abdominal: Soft. Bowel sounds are normal. There is no tenderness.  Musculoskeletal: Normal range of motion. She exhibits no edema and no tenderness.  Neurological: She is alert. No cranial nerve deficit. She exhibits normal muscle tone. Coordination normal.  Skin: Skin is warm and dry. No rash noted.  Left forehead laceration with suture closure-healing nicely-removed 03/21/14 and healed.    Psychiatric: Her mood appears anxious. Her affect is labile. Her affect is not angry. Her speech is tangential. Her speech is not slurred. She is agitated (when assisted with peronal care) and combative (when she doesn't understad what you want her to do). She is not aggressive, not hyperactive and not withdrawn. Thought content is not paranoid and not delusional. Cognition and memory  are impaired. She expresses impulsivity and inappropriate judgment. She does not exhibit a depressed mood. She exhibits abnormal recent memory and abnormal remote memory.    Filed Vitals:   04/18/14 1038  BP: 112/72  Pulse: 74  Temp: 97.8 F (36.6 C)  TempSrc: Tympanic  Resp: 16      Labs reviewed: Basic Metabolic Panel:  Recent Labs  08/01/13 03/20/14  NA 140 137  K 3.8 4.4  BUN 25* 26*  CREATININE 0.9 0.8  TSH 0.80 0.74   Liver Function Tests:  Recent Labs  08/01/13 03/20/14  AST 13 12*  ALT 9 8  ALKPHOS 97 101   CBC:  Recent Labs  08/01/13 03/20/14  WBC 7.8 6.2  HGB 12.5 11.8*  HCT 37 34*  PLT 326 288   Assessment/Plan Alzheimer's disease Continue supportive care in SNF and off all memory preserving medications    Anxiety state, unspecified Manageable with Celexa 5mg  and Ativan 0.5mg  qam/0.25mg  bid prn.    Barrett's esophagus Stable on Omeprazole 20mg  daily.     Osteoarthrosis, unspecified whether generalized or localized, unspecified site Tylenol 650mg  bid is adequate.     Pain in joint, shoulder region Tylenol 650mg  bid prn is adequate.    Unspecified constipation Stable on MiraLax daily  Unspecified essential hypertension Controlled and continue with Lisinopril 10mg    Urinary tract infection, site not specified Fully treated and healed clinically.     Family/ Staff Communication: observe the patient  Goals of Care: SNF  Labs/tests ordered: none

## 2014-04-18 NOTE — Assessment & Plan Note (Signed)
Fully treated and healed clinically.  

## 2014-04-18 NOTE — Assessment & Plan Note (Signed)
Continue supportive care in SNF and off all memory preserving medications

## 2014-04-18 NOTE — Assessment & Plan Note (Signed)
Stable on Omeprazole 20 mg daily.

## 2014-05-09 ENCOUNTER — Encounter: Payer: Self-pay | Admitting: Nurse Practitioner

## 2014-05-09 ENCOUNTER — Non-Acute Institutional Stay (SKILLED_NURSING_FACILITY): Payer: Medicare Other | Admitting: Nurse Practitioner

## 2014-05-09 DIAGNOSIS — K219 Gastro-esophageal reflux disease without esophagitis: Secondary | ICD-10-CM

## 2014-05-09 DIAGNOSIS — I10 Essential (primary) hypertension: Secondary | ICD-10-CM

## 2014-05-09 DIAGNOSIS — F411 Generalized anxiety disorder: Secondary | ICD-10-CM

## 2014-05-09 DIAGNOSIS — B372 Candidiasis of skin and nail: Secondary | ICD-10-CM

## 2014-05-09 DIAGNOSIS — L22 Diaper dermatitis: Secondary | ICD-10-CM

## 2014-05-09 DIAGNOSIS — G309 Alzheimer's disease, unspecified: Secondary | ICD-10-CM

## 2014-05-09 DIAGNOSIS — F0280 Dementia in other diseases classified elsewhere without behavioral disturbance: Secondary | ICD-10-CM

## 2014-05-09 DIAGNOSIS — K5900 Constipation, unspecified: Secondary | ICD-10-CM

## 2014-05-09 NOTE — Progress Notes (Signed)
Patient ID: JERLYN PAIN, female   DOB: 1917-08-09, 80 y.o.   MRN: [id number]   Code Status: DNR  Allergies  Allergen Reactions  . Morphine And Related     Per MAR  . Oxytrol [Oxybutynin]     Per [facility]    Chief Complaint  Patient presents with  . Medical Management of Chronic Issues  . Acute Visit    R+L groin redness.     HPI: Patient is a 80 y.o. female seen in the SNF at [facility] today for evaluation of redness in perineal area and chronic medical conditions.  Problem List Items Addressed This Visit   Alzheimer's disease - Primary     Continue supportive care in SNF and off all memory preserving medications. She is total care of her ADLs. No longer able to voice her own needs.        Anxiety state, unspecified     Manageable with Celexa 5mg  and Ativan 0.5mg  qam/0.25mg  bid prn.       Diaper candidiasis     Nystatin powder bid to affected area until healed.     GERD (gastroesophageal reflux disease)     Stable on Omeprazole 20mg  daily.       Unspecified constipation     Stable on MiraLax daily     Unspecified essential hypertension     Controlled and continue with Lisinopril 10mg         Review of Systems:  Review of Systems  Constitutional: Negative for fever and weight loss.       Itching legs with scratched marks. Right lateral lower leg rash. Nape rash-resolved.    HENT: Negative for congestion and hearing loss.   Eyes: Negative.        Dry eyes.   Respiratory: Negative for cough, sputum production and wheezing.   Cardiovascular: Negative for chest pain, orthopnea and leg swelling.  Gastrointestinal: Negative for nausea, vomiting, abdominal pain, diarrhea and constipation.  Genitourinary: Positive for frequency (incontinent of bladder) and pelvic pain. Negative for urgency and flank pain.  Musculoskeletal: Positive for falls (if not supervised). Negative for back pain, joint pain and neck pain.       Last fall 03/13/14 resulted in left  forehead laceration.   Skin: Negative for itching and rash.       R+L groin area redness  Neurological: Negative for tremors, speech change, focal weakness, seizures, loss of consciousness and weakness.  Endo/Heme/Allergies: Negative for polydipsia. Does not bruise/bleed easily.  Psychiatric/Behavioral: Positive for memory loss. Negative for depression and hallucinations. The patient is nervous/anxious (more freqeunt restlessness). The patient does not have insomnia.      Past Medical History  Diagnosis Date  . Malignant neoplasm of colon, unspecified site   . Anxiety state, unspecified   . Alzheimer's disease   . Unspecified essential hypertension   . Chronic airway obstruction, not elsewhere classified   . Barrett's esophagus   . Osteoarthrosis, unspecified whether generalized or localized, unspecified site   . Pain in joint, shoulder region   . Osteoporosis, unspecified   . Shortness of breath   . Flatulence, eructation, and gas pain   . Urinary frequency   . Hyperglycemia   . Pressure ulcer, heel(707.07)   . Other specified disease of white blood cells 02/24/2013  . Acute conjunctivitis, unspecified 02/24/2013  . Pneumonia, organism unspecified 02/24/2013  . Hypotension, unspecified 02/21/2013  . Acute bronchitis 02/21/2013  . Hemorrhage of gastrointestinal tract, unspecified 02/21/2013  . Altered mental status  02/21/2013  . Contusion of face, scalp, and neck except eye(s) 06/13/2012  . Unspecified fall 06/13/2012  . Hyposmolality and/or hyponatremia 05/17/2012  . Joint pain of lower extremity 04/15/2012  . Acute upper respiratory infections of unspecified site 04/24/2011  . Hordeolum externum 03/31/2011  . Contact dermatitis and other eczema, due to unspecified cause 09/26/2010  . Candidiasis of other urogenital sites 07/15/2010  . Rash and other nonspecific skin eruption 07/15/2010  . Unspecified vitamin D deficiency 05/07/2010  . Altered mental status 04/11/2010  .  Closed fracture of distal end of ulna (alone) 04/12  . Other specified disease of nail 08/16/2009  . Disorders of bursae and tendons in shoulder region, unspecified 06/21/2009  . Pain in joint, pelvic region and thigh 04/12/2009  . Anemia, unspecified 04/05/2009  . Hip joint replacement by other means 03/23/2009  . Unspecified constipation 10/09/2008  . Other malaise and fatigue 11/09/2006  . Aortic aneurysm of unspecified site without mention of rupture 07/28/2005  . Osteoarthrosis, unspecified whether generalized or localized, unspecified site 07/28/2005  . Pain in joint, shoulder region 07/28/2005  . Osteoporosis, unspecified 07/28/2005  . Other emphysema 07/29/1999  . Raynaud's syndrome 07/28/1998   Past Surgical History  Procedure Laterality Date  . Dilation and curettage of uterus    . Colon surgery  2001    partial colon resecton for cancer  . Cataract extraction w/ intraocular lens  implant, bilateral  2004  . Total hip arthroplasty Left 2010   Social History:   reports that she has never smoked. She does not have any smokeless tobacco history on file. She reports that she does not drink alcohol. Her drug history is not on file.  Family History  Problem Relation Age of Onset  . Stroke Mother     Medications: Patient's Medications  New Prescriptions   No medications on file  Previous Medications   ACETAMINOPHEN (TYLENOL) 325 MG TABLET    Take 650 mg by mouth 2 (two) times daily. *Also takes 650mg  every 6 hours as needed*   CALCIUM-VITAMIN D-VITAMIN K (VIACTIV) 563-875-64 MG-UNT-MCG CHEW    Chew 1 tablet by mouth daily.   CITALOPRAM (CELEXA) 10 MG TABLET    Take 5 mg by mouth daily. Take /12 daily to help nerves and depression.   ERGOCALCIFEROL (VITAMIN D2) 50000 UNITS CAPSULE    Take 50,000 Units by mouth every 30 (thirty) days. *Takes on the 17th of each month*   LACTOSE FREE NUTRITION (BOOST) LIQD    Take 237 mLs by mouth daily.   LISINOPRIL (PRINIVIL,ZESTRIL) 10 MG  TABLET    Take 10 mg by mouth daily.   LORAZEPAM (ATIVAN) 0.5 MG TABLET    Take 0.5 mg by mouth See admin instructions. Takes 0.5mg  daily in the morning and 0.25mg  twice as needed for anxiety throughout the day   OMEPRAZOLE (PRILOSEC) 20 MG CAPSULE    Take 20 mg by mouth daily.    POLYETHYLENE GLYCOL (MIRALAX / GLYCOLAX) PACKET    Take 17 g by mouth daily.  Modified Medications   No medications on file  Discontinued Medications   No medications on file     Physical Exam: Physical Exam  Constitutional: She appears well-developed and well-nourished.  HENT:  Head: Normocephalic and atraumatic.  Eyes: Conjunctivae and EOM are normal. Pupils are equal, round, and reactive to light. Right eye exhibits no discharge. Left eye exhibits no discharge. No scleral icterus.  Neck: Normal range of motion. Neck supple. No JVD present. No thyromegaly  present.  Cardiovascular: Normal rate, regular rhythm and normal heart sounds.   No murmur heard. Pulmonary/Chest: Effort normal. She has no wheezes. She has rales (bibasilar dry rales appreciated).  Abdominal: Soft. Bowel sounds are normal. There is no tenderness.  Musculoskeletal: Normal range of motion. She exhibits no edema and no tenderness.  Neurological: She is alert. No cranial nerve deficit. She exhibits normal muscle tone. Coordination normal.  Skin: Skin is warm and dry. No rash noted.  R+L groin area redness.    Psychiatric: Her mood appears anxious. Her affect is labile. Her affect is not angry. Her speech is tangential. Her speech is not slurred. She is agitated (when assisted with peronal care) and combative (when she doesn't understad what you want her to do). She is not aggressive, not hyperactive and not withdrawn. Thought content is not paranoid and not delusional. Cognition and memory are impaired. She expresses impulsivity and inappropriate judgment. She does not exhibit a depressed mood. She exhibits abnormal recent memory and abnormal  remote memory.    Filed Vitals:   05/09/14 1030  BP: 122/74  Pulse: 68  Temp: 98.4 F (36.9 C)  TempSrc: Tympanic  Resp: 18      Labs reviewed: Basic Metabolic Panel:  Recent Labs  08/01/13 03/20/14  NA 140 137  K 3.8 4.4  BUN 25* 26*  CREATININE 0.9 0.8  TSH 0.80 0.74   Liver Function Tests:  Recent Labs  08/01/13 03/20/14  AST 13 12*  ALT 9 8  ALKPHOS 97 101   CBC:  Recent Labs  08/01/13 03/20/14  WBC 7.8 6.2  HGB 12.5 11.8*  HCT 37 34*  PLT 326 288   Assessment/Plan Alzheimer's disease Continue supportive care in SNF and off all memory preserving medications. She is total care of her ADLs. No longer able to voice her own needs.      GERD (gastroesophageal reflux disease) Stable on Omeprazole 20mg  daily.     Anxiety state, unspecified Manageable with Celexa 5mg  and Ativan 0.5mg  qam/0.25mg  bid prn.     Unspecified essential hypertension Controlled and continue with Lisinopril 10mg    Unspecified constipation Stable on MiraLax daily   Diaper candidiasis Nystatin powder bid to affected area until healed.     Family/ Staff Communication: observe the patient  Goals of Care: SNF  Labs/tests ordered: none

## 2014-05-09 NOTE — Assessment & Plan Note (Signed)
Manageable with Celexa 5mg and Ativan 0.5mg qam/0.25mg bid prn.  

## 2014-05-09 NOTE — Assessment & Plan Note (Signed)
Continue supportive care in SNF and off all memory preserving medications. She is total care of her ADLs. No longer able to voice her own needs.    

## 2014-05-09 NOTE — Assessment & Plan Note (Signed)
Stable on Omeprazole 20 mg daily.

## 2014-05-09 NOTE — Assessment & Plan Note (Signed)
Nystatin powder bid to affected area until healed.  

## 2014-05-09 NOTE — Assessment & Plan Note (Signed)
Stable on MiraLax daily.  

## 2014-05-09 NOTE — Assessment & Plan Note (Signed)
Controlled and continue with Lisinopril 10mg  

## 2014-05-14 ENCOUNTER — Other Ambulatory Visit: Payer: Self-pay | Admitting: *Deleted

## 2014-05-14 MED ORDER — LORAZEPAM 0.5 MG PO TABS: ORAL_TABLET | ORAL | Status: DC

## 2014-05-14 NOTE — Telephone Encounter (Signed)
[place] Order

## 2014-06-11 ENCOUNTER — Non-Acute Institutional Stay (SKILLED_NURSING_FACILITY): Payer: Medicare Other | Admitting: Nurse Practitioner

## 2014-06-11 ENCOUNTER — Encounter: Payer: Self-pay | Admitting: Nurse Practitioner

## 2014-06-11 DIAGNOSIS — K2270 Barrett's esophagus without dysplasia: Secondary | ICD-10-CM

## 2014-06-11 DIAGNOSIS — L22 Diaper dermatitis: Secondary | ICD-10-CM

## 2014-06-11 DIAGNOSIS — F411 Generalized anxiety disorder: Secondary | ICD-10-CM

## 2014-06-11 DIAGNOSIS — G309 Alzheimer's disease, unspecified: Secondary | ICD-10-CM

## 2014-06-11 DIAGNOSIS — F0280 Dementia in other diseases classified elsewhere without behavioral disturbance: Secondary | ICD-10-CM

## 2014-06-11 DIAGNOSIS — I10 Essential (primary) hypertension: Secondary | ICD-10-CM

## 2014-06-11 DIAGNOSIS — B372 Candidiasis of skin and nail: Secondary | ICD-10-CM

## 2014-06-11 DIAGNOSIS — K5900 Constipation, unspecified: Secondary | ICD-10-CM

## 2014-06-11 NOTE — Assessment & Plan Note (Signed)
Manageable with Celexa 5mg and Ativan 0.5mg qam/0.25mg bid prn.  

## 2014-06-11 NOTE — Progress Notes (Signed)
Patient ID: [name], female   DOB: 1917-07-06, 80 y.o.   MRN: [id number]   Code Status: DNR  Allergies  Allergen Reactions  . Morphine And Related     Per MAR  . Oxytrol [Oxybutynin]     Per [facility]    Chief Complaint  Patient presents with  . Medical Management of Chronic Issues    HPI: Patient is a 80 y.o. female seen in the SNF at [facility] today for evaluation of chronic medical conditions.  Problem List Items Addressed This Visit   Unspecified essential hypertension     Controlled and continue with Lisinopril 10mg       Unspecified constipation     Stable on MiraLax daily      Diaper candidiasis     Healed.     Barrett's esophagus     Stable on Omeprazole 20mg  daily.       Anxiety state, unspecified     Manageable with Celexa 5mg  and Ativan 0.5mg  qam/0.25mg  bid prn.       Alzheimer's disease - Primary     Continue supportive care in SNF and off all memory preserving medications. She is total care of her ADLs. No longer able to voice her own needs.          Review of Systems:  Review of Systems  Constitutional: Negative for fever and weight loss.       Itching legs with scratched marks. Right lateral lower leg rash. Nape rash-resolved.    HENT: Negative for congestion and hearing loss.   Eyes: Negative.        Dry eyes.   Respiratory: Negative for cough, sputum production and wheezing.   Cardiovascular: Negative for chest pain, orthopnea and leg swelling.  Gastrointestinal: Negative for nausea, vomiting, abdominal pain, diarrhea and constipation.  Genitourinary: Positive for frequency (incontinent of bladder) and pelvic pain. Negative for urgency and flank pain.  Musculoskeletal: Positive for falls (if not supervised). Negative for back pain, joint pain and neck pain.       Last fall 03/13/14 resulted in left forehead laceration.   Skin: Negative for itching and rash.  Neurological: Negative for tremors, speech change, focal weakness,  seizures, loss of consciousness and weakness.  Endo/Heme/Allergies: Negative for polydipsia. Does not bruise/bleed easily.  Psychiatric/Behavioral: Positive for memory loss. Negative for depression and hallucinations. The patient is nervous/anxious (more freqeunt restlessness). The patient does not have insomnia.      Past Medical History  Diagnosis Date  . Malignant neoplasm of colon, unspecified site   . Anxiety state, unspecified   . Alzheimer's disease   . Unspecified essential hypertension   . Chronic airway obstruction, not elsewhere classified   . Barrett's esophagus   . Osteoarthrosis, unspecified whether generalized or localized, unspecified site   . Pain in joint, shoulder region   . Osteoporosis, unspecified   . Shortness of breath   . Flatulence, eructation, and gas pain   . Urinary frequency   . Hyperglycemia   . Pressure ulcer, heel(707.07)   . Other specified disease of white blood cells 02/24/2013  . Acute conjunctivitis, unspecified 02/24/2013  . Pneumonia, organism unspecified 02/24/2013  . Hypotension, unspecified 02/21/2013  . Acute bronchitis 02/21/2013  . Hemorrhage of gastrointestinal tract, unspecified 02/21/2013  . Altered mental status 02/21/2013  . Contusion of face, scalp, and neck except eye(s) 06/13/2012  . Unspecified fall 06/13/2012  . Hyposmolality and/or hyponatremia 05/17/2012  . Joint pain of lower extremity 04/15/2012  . Acute  upper respiratory infections of unspecified site 04/24/2011  . Hordeolum externum 03/31/2011  . Contact dermatitis and other eczema, due to unspecified cause 09/26/2010  . Candidiasis of other urogenital sites 07/15/2010  . Rash and other nonspecific skin eruption 07/15/2010  . Unspecified vitamin D deficiency 05/07/2010  . Altered mental status 04/11/2010  . Closed fracture of distal end of ulna (alone) 04/12  . Other specified disease of nail 08/16/2009  . Disorders of bursae and tendons in shoulder region,  unspecified 06/21/2009  . Pain in joint, pelvic region and thigh 04/12/2009  . Anemia, unspecified 04/05/2009  . Hip joint replacement by other means 03/23/2009  . Unspecified constipation 10/09/2008  . Other malaise and fatigue 11/09/2006  . Aortic aneurysm of unspecified site without mention of rupture 07/28/2005  . Osteoarthrosis, unspecified whether generalized or localized, unspecified site 07/28/2005  . Pain in joint, shoulder region 07/28/2005  . Osteoporosis, unspecified 07/28/2005  . Other emphysema 07/29/1999  . Raynaud's syndrome 07/28/1998   Past Surgical History  Procedure Laterality Date  . Dilation and curettage of uterus    . Colon surgery  2001    partial colon resecton for cancer  . Cataract extraction w/ intraocular lens  implant, bilateral  2004  . Total hip arthroplasty Left 2010   Social History:   reports that she has never smoked. She does not have any smokeless tobacco history on file. She reports that she does not drink alcohol. Her drug history is not on file.  Family History  Problem Relation Age of Onset  . Stroke Mother     Medications: Patient's Medications  New Prescriptions   No medications on file  Previous Medications   ACETAMINOPHEN (TYLENOL) 325 MG TABLET    Take 650 mg by mouth 2 (two) times daily. *Also takes 650mg  every 6 hours as needed*   CALCIUM-VITAMIN D-VITAMIN K (VIACTIV) 062-694-85 MG-UNT-MCG CHEW    Chew 1 tablet by mouth daily.   CITALOPRAM (CELEXA) 10 MG TABLET    Take 5 mg by mouth daily. Take /12 daily to help nerves and depression.   ERGOCALCIFEROL (VITAMIN D2) 50000 UNITS CAPSULE    Take 50,000 Units by mouth every 30 (thirty) days. *Takes on the 17th of each month*   LACTOSE FREE NUTRITION (BOOST) LIQD    Take 237 mLs by mouth daily.   LISINOPRIL (PRINIVIL,ZESTRIL) 10 MG TABLET    Take 10 mg by mouth daily.   LORAZEPAM (ATIVAN) 0.5 MG TABLET    Take one tablet by mouth every morning for anxiety   OMEPRAZOLE (PRILOSEC) 20  MG CAPSULE    Take 20 mg by mouth daily.    POLYETHYLENE GLYCOL (MIRALAX / GLYCOLAX) PACKET    Take 17 g by mouth daily.  Modified Medications   No medications on file  Discontinued Medications   No medications on file     Physical Exam: Physical Exam  Constitutional: She appears well-developed and well-nourished.  HENT:  Head: Normocephalic and atraumatic.  Eyes: Conjunctivae and EOM are normal. Pupils are equal, round, and reactive to light. Right eye exhibits no discharge. Left eye exhibits no discharge. No scleral icterus.  Neck: Normal range of motion. Neck supple. No JVD present. No thyromegaly present.  Cardiovascular: Normal rate, regular rhythm and normal heart sounds.   No murmur heard. Pulmonary/Chest: Effort normal. She has no wheezes. She has rales (bibasilar dry rales appreciated).  Abdominal: Soft. Bowel sounds are normal. There is no tenderness.  Musculoskeletal: Normal range of motion. She exhibits  no edema and no tenderness.  Neurological: She is alert. No cranial nerve deficit. She exhibits normal muscle tone. Coordination normal.  Skin: Skin is warm and dry. No rash noted.     Psychiatric: Her mood appears anxious. Her affect is labile. Her affect is not angry. Her speech is tangential. Her speech is not slurred. She is agitated (when assisted with peronal care) and combative (when she doesn't understad what you want her to do). She is not aggressive, not hyperactive and not withdrawn. Thought content is not paranoid and not delusional. Cognition and memory are impaired. She expresses impulsivity and inappropriate judgment. She does not exhibit a depressed mood. She exhibits abnormal recent memory and abnormal remote memory.    Filed Vitals:   06/11/14 1056  BP: 122/68  Pulse: 68  Temp: 98.4 F (36.9 C)  TempSrc: Tympanic  Resp: 16      Labs reviewed: Basic Metabolic Panel:  Recent Labs  08/01/13 03/20/14  NA 140 137  K 3.8 4.4  BUN 25* 26*    CREATININE 0.9 0.8  TSH 0.80 0.74   Liver Function Tests:  Recent Labs  08/01/13 03/20/14  AST 13 12*  ALT 9 8  ALKPHOS 97 101   CBC:  Recent Labs  08/01/13 03/20/14  WBC 7.8 6.2  HGB 12.5 11.8*  HCT 37 34*  PLT 326 288   Assessment/Plan Alzheimer's disease Continue supportive care in SNF and off all memory preserving medications. She is total care of her ADLs. No longer able to voice her own needs.     Anxiety state, unspecified Manageable with Celexa 5mg  and Ativan 0.5mg  qam/0.25mg  bid prn.     Barrett's esophagus Stable on Omeprazole 20mg  daily.     Diaper candidiasis Healed.   Unspecified constipation Stable on MiraLax daily    Unspecified essential hypertension Controlled and continue with Lisinopril 10mg       Family/ Staff Communication: observe the patient  Goals of Care: SNF  Labs/tests ordered: none

## 2014-06-11 NOTE — Assessment & Plan Note (Signed)
Controlled and continue with Lisinopril 10mg  

## 2014-06-11 NOTE — Assessment & Plan Note (Signed)
Stable on Omeprazole 20 mg daily.

## 2014-06-11 NOTE — Assessment & Plan Note (Signed)
Stable on MiraLax daily.  

## 2014-06-11 NOTE — Assessment & Plan Note (Signed)
Healed

## 2014-06-11 NOTE — Assessment & Plan Note (Signed)
Continue supportive care in SNF and off all memory preserving medications. She is total care of her ADLs. No longer able to voice her own needs.

## 2014-07-09 ENCOUNTER — Encounter: Payer: Self-pay | Admitting: Nurse Practitioner

## 2014-07-09 ENCOUNTER — Non-Acute Institutional Stay (SKILLED_NURSING_FACILITY): Payer: Medicare Other | Admitting: Nurse Practitioner

## 2014-07-09 DIAGNOSIS — F411 Generalized anxiety disorder: Secondary | ICD-10-CM

## 2014-07-09 DIAGNOSIS — B372 Candidiasis of skin and nail: Secondary | ICD-10-CM

## 2014-07-09 DIAGNOSIS — L22 Diaper dermatitis: Secondary | ICD-10-CM

## 2014-07-09 DIAGNOSIS — I10 Essential (primary) hypertension: Secondary | ICD-10-CM

## 2014-07-09 DIAGNOSIS — K5900 Constipation, unspecified: Secondary | ICD-10-CM

## 2014-07-09 DIAGNOSIS — K2270 Barrett's esophagus without dysplasia: Secondary | ICD-10-CM

## 2014-07-09 NOTE — Assessment & Plan Note (Signed)
Stable on MiraLax daily.  

## 2014-07-09 NOTE — Assessment & Plan Note (Signed)
Manageable with Celexa 5mg and Ativan 0.5mg qam/0.25mg bid prn.  

## 2014-07-09 NOTE — Assessment & Plan Note (Signed)
Controlled and continue with Lisinopril 10mg  

## 2014-07-09 NOTE — Assessment & Plan Note (Signed)
Stable on Omeprazole 20 mg daily.

## 2014-07-09 NOTE — Assessment & Plan Note (Signed)
Resolved

## 2014-07-09 NOTE — Progress Notes (Signed)
Patient ID: IDONNA HEEREN, female   DOB: Jan 04, 1917, 80 y.o.   MRN: [id number]   Code Status: DNR  Allergies  Allergen Reactions  . Morphine And Related     Per MAR  . Oxytrol [Oxybutynin]     Per [facility]    Chief Complaint  Patient presents with  . Medical Management of Chronic Issues    HPI: Patient is a 80 y.o. female seen in the SNF at [facility] today for evaluation of chronic medical conditions.  Problem List Items Addressed This Visit   Anxiety state, unspecified - Primary     Manageable with Celexa 5mg  and Ativan 0.5mg  qam/0.25mg  bid prn.     Unspecified constipation     Stable on MiraLax daily     Unspecified essential hypertension     Controlled and continue with Lisinopril 10mg      Barrett's esophagus     Stable on Omeprazole 20mg  daily.      Diaper candidiasis     Resolved.        Review of Systems:  Review of Systems  Constitutional: Negative for fever and weight loss.       Itching legs with scratched marks. Right lateral lower leg rash. Nape rash-resolved.    HENT: Negative for congestion and hearing loss.   Eyes: Negative.        Dry eyes.   Respiratory: Negative for cough, sputum production and wheezing.   Cardiovascular: Negative for chest pain, orthopnea and leg swelling.  Gastrointestinal: Negative for nausea, vomiting, abdominal pain, diarrhea and constipation.  Genitourinary: Positive for frequency (incontinent of bladder) and pelvic pain. Negative for urgency and flank pain.  Musculoskeletal: Positive for falls (if not supervised). Negative for back pain, joint pain and neck pain.       Last fall 03/13/14 resulted in left forehead laceration.   Skin: Negative for itching and rash.  Neurological: Negative for tremors, speech change, focal weakness, seizures, loss of consciousness and weakness.  Endo/Heme/Allergies: Negative for polydipsia. Does not bruise/bleed easily.  Psychiatric/Behavioral: Positive for memory loss. Negative for  depression and hallucinations. The patient is nervous/anxious (more freqeunt restlessness). The patient does not have insomnia.      Past Medical History  Diagnosis Date  . Malignant neoplasm of colon, unspecified site   . Anxiety state, unspecified   . Alzheimer's disease   . Unspecified essential hypertension   . Chronic airway obstruction, not elsewhere classified   . Barrett's esophagus   . Osteoarthrosis, unspecified whether generalized or localized, unspecified site   . Pain in joint, shoulder region   . Osteoporosis, unspecified   . Shortness of breath   . Flatulence, eructation, and gas pain   . Urinary frequency   . Hyperglycemia   . Pressure ulcer, heel(707.07)   . Other specified disease of white blood cells 02/24/2013  . Acute conjunctivitis, unspecified 02/24/2013  . Pneumonia, organism unspecified 02/24/2013  . Hypotension, unspecified 02/21/2013  . Acute bronchitis 02/21/2013  . Hemorrhage of gastrointestinal tract, unspecified 02/21/2013  . Altered mental status 02/21/2013  . Contusion of face, scalp, and neck except eye(s) 06/13/2012  . Unspecified fall 06/13/2012  . Hyposmolality and/or hyponatremia 05/17/2012  . Joint pain of lower extremity 04/15/2012  . Acute upper respiratory infections of unspecified site 04/24/2011  . Hordeolum externum 03/31/2011  . Contact dermatitis and other eczema, due to unspecified cause 09/26/2010  . Candidiasis of other urogenital sites 07/15/2010  . Rash and other nonspecific skin eruption 07/15/2010  .  Unspecified vitamin D deficiency 05/07/2010  . Altered mental status 04/11/2010  . Closed fracture of distal end of ulna (alone) 04/12  . Other specified disease of nail 08/16/2009  . Disorders of bursae and tendons in shoulder region, unspecified 06/21/2009  . Pain in joint, pelvic region and thigh 04/12/2009  . Anemia, unspecified 04/05/2009  . Hip joint replacement by other means 03/23/2009  . Unspecified constipation  10/09/2008  . Other malaise and fatigue 11/09/2006  . Aortic aneurysm of unspecified site without mention of rupture 07/28/2005  . Osteoarthrosis, unspecified whether generalized or localized, unspecified site 07/28/2005  . Pain in joint, shoulder region 07/28/2005  . Osteoporosis, unspecified 07/28/2005  . Other emphysema 07/29/1999  . Raynaud's syndrome 07/28/1998   Past Surgical History  Procedure Laterality Date  . Dilation and curettage of uterus    . Colon surgery  2001    partial colon resecton for cancer  . Cataract extraction w/ intraocular lens  implant, bilateral  2004  . Total hip arthroplasty Left 2010   Social History:   reports that she has never smoked. She does not have any smokeless tobacco history on file. She reports that she does not drink alcohol. Her drug history is not on file.  Family History  Problem Relation Age of Onset  . Stroke Mother     Medications: Patient's Medications  New Prescriptions   No medications on file  Previous Medications   ACETAMINOPHEN (TYLENOL) 325 MG TABLET    Take 650 mg by mouth 2 (two) times daily. *Also takes 650mg  every 6 hours as needed*   CALCIUM-VITAMIN D-VITAMIN K (VIACTIV) 607-371-06 MG-UNT-MCG CHEW    Chew 1 tablet by mouth daily.   CITALOPRAM (CELEXA) 10 MG TABLET    Take 5 mg by mouth daily. Take /12 daily to help nerves and depression.   ERGOCALCIFEROL (VITAMIN D2) 50000 UNITS CAPSULE    Take 50,000 Units by mouth every 30 (thirty) days. *Takes on the 17th of each month*   LACTOSE FREE NUTRITION (BOOST) LIQD    Take 237 mLs by mouth daily.   LISINOPRIL (PRINIVIL,ZESTRIL) 10 MG TABLET    Take 10 mg by mouth daily.   LORAZEPAM (ATIVAN) 0.5 MG TABLET    Take one tablet by mouth every morning for anxiety   OMEPRAZOLE (PRILOSEC) 20 MG CAPSULE    Take 20 mg by mouth daily.    POLYETHYLENE GLYCOL (MIRALAX / GLYCOLAX) PACKET    Take 17 g by mouth daily.  Modified Medications   No medications on file  Discontinued  Medications   No medications on file     Physical Exam: Physical Exam  Constitutional: She appears well-developed and well-nourished.  HENT:  Head: Normocephalic and atraumatic.  Eyes: Conjunctivae and EOM are normal. Pupils are equal, round, and reactive to light. Right eye exhibits no discharge. Left eye exhibits no discharge. No scleral icterus.  Neck: Normal range of motion. Neck supple. No JVD present. No thyromegaly present.  Cardiovascular: Normal rate, regular rhythm and normal heart sounds.   No murmur heard. Pulmonary/Chest: Effort normal. She has no wheezes. She has rales (bibasilar dry rales appreciated).  Abdominal: Soft. Bowel sounds are normal. There is no tenderness.  Musculoskeletal: Normal range of motion. She exhibits no edema and no tenderness.  Neurological: She is alert. No cranial nerve deficit. She exhibits normal muscle tone. Coordination normal.  Skin: Skin is warm and dry. No rash noted.     Psychiatric: Her mood appears anxious. Her affect is  labile. Her affect is not angry. Her speech is tangential. Her speech is not slurred. She is agitated (when assisted with peronal care) and combative (when she doesn't understad what you want her to do). She is not aggressive, not hyperactive and not withdrawn. Thought content is not paranoid and not delusional. Cognition and memory are impaired. She expresses impulsivity and inappropriate judgment. She does not exhibit a depressed mood. She exhibits abnormal recent memory and abnormal remote memory.    Filed Vitals:   07/09/14 1147  BP: 118/78  Pulse: 68  Temp: 97.9 F (36.6 C)  TempSrc: Tympanic  Resp: 14      Labs reviewed: Basic Metabolic Panel:  Recent Labs  08/01/13 03/20/14  NA 140 137  K 3.8 4.4  BUN 25* 26*  CREATININE 0.9 0.8  TSH 0.80 0.74   Liver Function Tests:  Recent Labs  08/01/13 03/20/14  AST 13 12*  ALT 9 8  ALKPHOS 97 101   CBC:  Recent Labs  08/01/13 03/20/14  WBC 7.8 6.2   HGB 12.5 11.8*  HCT 37 34*  PLT 326 288   Assessment/Plan Anxiety state, unspecified Manageable with Celexa 5mg  and Ativan 0.5mg  qam/0.25mg  bid prn.   Unspecified constipation Stable on MiraLax daily   Unspecified essential hypertension Controlled and continue with Lisinopril 10mg    Barrett's esophagus Stable on Omeprazole 20mg  daily.    Diaper candidiasis Resolved.     Family/ Staff Communication: observe the patient  Goals of Care: SNF  Labs/tests ordered: none

## 2014-08-06 ENCOUNTER — Encounter: Payer: Self-pay | Admitting: Nurse Practitioner

## 2014-08-06 ENCOUNTER — Non-Acute Institutional Stay (SKILLED_NURSING_FACILITY): Payer: Medicare Other | Admitting: Nurse Practitioner

## 2014-08-06 DIAGNOSIS — I10 Essential (primary) hypertension: Secondary | ICD-10-CM

## 2014-08-06 DIAGNOSIS — M1990 Unspecified osteoarthritis, unspecified site: Secondary | ICD-10-CM

## 2014-08-06 DIAGNOSIS — K219 Gastro-esophageal reflux disease without esophagitis: Secondary | ICD-10-CM

## 2014-08-06 DIAGNOSIS — K5900 Constipation, unspecified: Secondary | ICD-10-CM

## 2014-08-06 DIAGNOSIS — F411 Generalized anxiety disorder: Secondary | ICD-10-CM

## 2014-08-06 NOTE — Progress Notes (Signed)
Patient ID: [name], female   DOB: 09-Sep-1917, 80 y.o.   MRN: [id number]   Code Status: DNR  Allergies  Allergen Reactions  . Morphine And Related     Per MAR  . Oxytrol [Oxybutynin]     Per [facility]    Chief Complaint  Patient presents with  . Medical Management of Chronic Issues    HPI: Patient is a 80 y.o. female seen in the SNF at [facility] today for evaluation of chronic medical conditions.  Problem List Items Addressed This Visit   Anxiety state, unspecified - Primary     Manageable with Celexa 5mg  and Ativan 0.5mg  qam/0.25mg  bid prn.      Unspecified constipation     Stable on MiraLax daily      GERD (gastroesophageal reflux disease)     Stable on Omeprazole 20mg  daily.      Unspecified essential hypertension     Controlled and continue with Lisinopril 10mg       Osteoarthrosis, unspecified whether generalized or localized, unspecified site     Tylenol 650mg  bid is adequate.          Review of Systems:  Review of Systems  Constitutional: Negative for fever and weight loss.       Itching legs with scratched marks. Right lateral lower leg rash. Nape rash-resolved.    HENT: Negative for congestion and hearing loss.   Eyes: Negative.        Dry eyes.   Respiratory: Negative for cough, sputum production and wheezing.   Cardiovascular: Negative for chest pain, orthopnea and leg swelling.  Gastrointestinal: Negative for nausea, vomiting, abdominal pain, diarrhea and constipation.  Genitourinary: Positive for frequency (incontinent of bladder) and pelvic pain. Negative for urgency and flank pain.  Musculoskeletal: Positive for falls (if not supervised). Negative for back pain, joint pain and neck pain.       Last fall 03/13/14 resulted in left forehead laceration.   Skin: Negative for itching and rash.  Neurological: Negative for tremors, speech change, focal weakness, seizures, loss of consciousness and weakness.  Endo/Heme/Allergies: Negative for  polydipsia. Does not bruise/bleed easily.  Psychiatric/Behavioral: Positive for memory loss. Negative for depression and hallucinations. The patient is nervous/anxious (more freqeunt restlessness). The patient does not have insomnia.      Past Medical History  Diagnosis Date  . Malignant neoplasm of colon, unspecified site   . Anxiety state, unspecified   . Alzheimer's disease   . Unspecified essential hypertension   . Chronic airway obstruction, not elsewhere classified   . Barrett's esophagus   . Osteoarthrosis, unspecified whether generalized or localized, unspecified site   . Pain in joint, shoulder region   . Osteoporosis, unspecified   . Shortness of breath   . Flatulence, eructation, and gas pain   . Urinary frequency   . Hyperglycemia   . Pressure ulcer, heel(707.07)   . Other specified disease of white blood cells 02/24/2013  . Acute conjunctivitis, unspecified 02/24/2013  . Pneumonia, organism unspecified 02/24/2013  . Hypotension, unspecified 02/21/2013  . Acute bronchitis 02/21/2013  . Hemorrhage of gastrointestinal tract, unspecified 02/21/2013  . Altered mental status 02/21/2013  . Contusion of face, scalp, and neck except eye(s) 06/13/2012  . Unspecified fall 06/13/2012  . Hyposmolality and/or hyponatremia 05/17/2012  . Joint pain of lower extremity 04/15/2012  . Acute upper respiratory infections of unspecified site 04/24/2011  . Hordeolum externum 03/31/2011  . Contact dermatitis and other eczema, due to unspecified cause 09/26/2010  .  Candidiasis of other urogenital sites 07/15/2010  . Rash and other nonspecific skin eruption 07/15/2010  . Unspecified vitamin D deficiency 05/07/2010  . Altered mental status 04/11/2010  . Closed fracture of distal end of ulna (alone) 04/12  . Other specified disease of nail 08/16/2009  . Disorders of bursae and tendons in shoulder region, unspecified 06/21/2009  . Pain in joint, pelvic region and thigh 04/12/2009  . Anemia,  unspecified 04/05/2009  . Hip joint replacement by other means 03/23/2009  . Unspecified constipation 10/09/2008  . Other malaise and fatigue 11/09/2006  . Aortic aneurysm of unspecified site without mention of rupture 07/28/2005  . Osteoarthrosis, unspecified whether generalized or localized, unspecified site 07/28/2005  . Pain in joint, shoulder region 07/28/2005  . Osteoporosis, unspecified 07/28/2005  . Other emphysema 07/29/1999  . Raynaud's syndrome 07/28/1998   Past Surgical History  Procedure Laterality Date  . Dilation and curettage of uterus    . Colon surgery  2001    partial colon resecton for cancer  . Cataract extraction w/ intraocular lens  implant, bilateral  2004  . Total hip arthroplasty Left 2010   Social History:   reports that she has never smoked. She does not have any smokeless tobacco history on file. She reports that she does not drink alcohol. Her drug history is not on file.  Family History  Problem Relation Age of Onset  . Stroke Mother     Medications: Patient's Medications  New Prescriptions   No medications on file  Previous Medications   ACETAMINOPHEN (TYLENOL) 325 MG TABLET    Take 650 mg by mouth 2 (two) times daily. *Also takes 650mg  every 6 hours as needed*   CALCIUM-VITAMIN D-VITAMIN K (VIACTIV) 694-854-62 MG-UNT-MCG CHEW    Chew 1 tablet by mouth daily.   CITALOPRAM (CELEXA) 10 MG TABLET    Take 5 mg by mouth daily. Take /12 daily to help nerves and depression.   ERGOCALCIFEROL (VITAMIN D2) 50000 UNITS CAPSULE    Take 50,000 Units by mouth every 30 (thirty) days. *Takes on the 17th of each month*   LACTOSE FREE NUTRITION (BOOST) LIQD    Take 237 mLs by mouth daily.   LISINOPRIL (PRINIVIL,ZESTRIL) 10 MG TABLET    Take 10 mg by mouth daily.   LORAZEPAM (ATIVAN) 0.5 MG TABLET    Take one tablet by mouth every morning for anxiety   OMEPRAZOLE (PRILOSEC) 20 MG CAPSULE    Take 20 mg by mouth daily.    POLYETHYLENE GLYCOL (MIRALAX / GLYCOLAX)  PACKET    Take 17 g by mouth daily.  Modified Medications   No medications on file  Discontinued Medications   No medications on file     Physical Exam: Physical Exam  Constitutional: She appears well-developed and well-nourished.  HENT:  Head: Normocephalic and atraumatic.  Eyes: Conjunctivae and EOM are normal. Pupils are equal, round, and reactive to light. Right eye exhibits no discharge. Left eye exhibits no discharge. No scleral icterus.  Neck: Normal range of motion. Neck supple. No JVD present. No thyromegaly present.  Cardiovascular: Normal rate, regular rhythm and normal heart sounds.   No murmur heard. Pulmonary/Chest: Effort normal. She has no wheezes. She has rales (bibasilar dry rales appreciated).  Abdominal: Soft. Bowel sounds are normal. There is no tenderness.  Musculoskeletal: Normal range of motion. She exhibits no edema and no tenderness.  Neurological: She is alert. No cranial nerve deficit. She exhibits normal muscle tone. Coordination normal.  Skin: Skin is warm  and dry. No rash noted.     Psychiatric: Her mood appears anxious. Her affect is labile. Her affect is not angry. Her speech is tangential. Her speech is not slurred. She is agitated (when assisted with peronal care) and combative (when she doesn't understad what you want her to do). She is not aggressive, not hyperactive and not withdrawn. Thought content is not paranoid and not delusional. Cognition and memory are impaired. She expresses impulsivity and inappropriate judgment. She does not exhibit a depressed mood. She exhibits abnormal recent memory and abnormal remote memory.    Filed Vitals:   08/06/14 1519  BP: 118/78  Pulse: 68  Temp: 97.9 F (36.6 C)  TempSrc: Tympanic  Resp: 14      Labs reviewed: Basic Metabolic Panel:  Recent Labs  03/20/14  NA 137  K 4.4  BUN 26*  CREATININE 0.8  TSH 0.74   Liver Function Tests:  Recent Labs  03/20/14  AST 12*  ALT 8  ALKPHOS 101    CBC:  Recent Labs  03/20/14  WBC 6.2  HGB 11.8*  HCT 34*  PLT 288   Assessment/Plan Anxiety state, unspecified Manageable with Celexa 5mg  and Ativan 0.5mg  qam/0.25mg  bid prn.    Unspecified essential hypertension Controlled and continue with Lisinopril 10mg     Unspecified constipation Stable on MiraLax daily    GERD (gastroesophageal reflux disease) Stable on Omeprazole 20mg  daily.    Osteoarthrosis, unspecified whether generalized or localized, unspecified site Tylenol 650mg  bid is adequate.       Family/ Staff Communication: observe the patient  Goals of Care: SNF  Labs/tests ordered: none

## 2014-08-06 NOTE — Assessment & Plan Note (Signed)
Tylenol 650mg  bid is adequate.

## 2014-08-06 NOTE — Assessment & Plan Note (Signed)
Controlled and continue with Lisinopril 10mg  

## 2014-08-06 NOTE — Assessment & Plan Note (Signed)
Stable on Omeprazole 20 mg daily.

## 2014-08-06 NOTE — Assessment & Plan Note (Signed)
Stable on MiraLax daily.  

## 2014-08-06 NOTE — Assessment & Plan Note (Signed)
Manageable with Celexa 5mg and Ativan 0.5mg qam/0.25mg bid prn.  

## 2014-08-27 ENCOUNTER — Encounter: Payer: Self-pay | Admitting: Nurse Practitioner

## 2014-08-27 ENCOUNTER — Non-Acute Institutional Stay (SKILLED_NURSING_FACILITY): Payer: Medicare Other | Admitting: Nurse Practitioner

## 2014-08-27 DIAGNOSIS — J309 Allergic rhinitis, unspecified: Secondary | ICD-10-CM

## 2014-08-27 DIAGNOSIS — K219 Gastro-esophageal reflux disease without esophagitis: Secondary | ICD-10-CM

## 2014-08-27 DIAGNOSIS — I10 Essential (primary) hypertension: Secondary | ICD-10-CM

## 2014-08-27 DIAGNOSIS — K5900 Constipation, unspecified: Secondary | ICD-10-CM

## 2014-08-27 DIAGNOSIS — M1990 Unspecified osteoarthritis, unspecified site: Secondary | ICD-10-CM

## 2014-08-27 DIAGNOSIS — J302 Other seasonal allergic rhinitis: Secondary | ICD-10-CM

## 2014-08-27 DIAGNOSIS — F411 Generalized anxiety disorder: Secondary | ICD-10-CM

## 2014-08-27 NOTE — Assessment & Plan Note (Signed)
Controlled and continue with Lisinopril 10mg  

## 2014-08-27 NOTE — Progress Notes (Signed)
Patient ID: [name], female   DOB: November 18, 1917, 80 y.o.   MRN: [id number]   Code Status: DNR  Allergies  Allergen Reactions  . Morphine And Related     Per MAR  . Oxytrol [Oxybutynin]     Per [facility]    Chief Complaint  Patient presents with  . Medical Management of Chronic Issues  . Acute Visit    red and watery eyes.     HPI: Patient is a 80 y.o. female seen in the SNF at [facility] today for evaluation of watery/red eys, scratched injury nose/lower legs, and chronic medical conditions.  Problem List Items Addressed This Visit   Anxiety state, unspecified     Manageable with Celexa 5mg  and Ativan 0.5mg  qam/0.25mg  bid prn.       GERD (gastroesophageal reflux disease)     Stable on Omeprazole 20mg  daily.       Osteoarthrosis, unspecified whether generalized or localized, unspecified site     Tylenol 650mg  bid is adequate.       Seasonal allergies - Primary     Red/watery eyes and itching BLE-will try Claritin 10mg  daily. Observe. Continue artificial tears.     Unspecified constipation     Stable on MiraLax daily      Unspecified essential hypertension     Controlled and continue with Lisinopril 10mg          Review of Systems:  Review of Systems  Constitutional: Negative for fever and weight loss.       Itching legs with scratched marks. Right lateral lower leg rash. Nape rash-resolved.    HENT: Negative for congestion and hearing loss.        Watery and red eyes  Eyes: Negative.        Watery and red eyes  Respiratory: Negative for cough, sputum production and wheezing.   Cardiovascular: Negative for chest pain, orthopnea and leg swelling.  Gastrointestinal: Negative for nausea, vomiting, abdominal pain, diarrhea and constipation.  Genitourinary: Positive for frequency (incontinent of bladder) and pelvic pain. Negative for urgency and flank pain.  Musculoskeletal: Positive for falls (if not supervised). Negative for back pain, joint pain and  neck pain.       Last fall 03/13/14 resulted in left forehead laceration.   Skin: Positive for itching. Negative for rash.       Scratched injury nose and lower legs.   Neurological: Negative for tremors, speech change, focal weakness, seizures, loss of consciousness and weakness.  Endo/Heme/Allergies: Negative for polydipsia. Does not bruise/bleed easily.  Psychiatric/Behavioral: Positive for memory loss. Negative for depression and hallucinations. The patient is nervous/anxious (more freqeunt restlessness). The patient does not have insomnia.      Past Medical History  Diagnosis Date  . Malignant neoplasm of colon, unspecified site   . Anxiety state, unspecified   . Alzheimer's disease   . Unspecified essential hypertension   . Chronic airway obstruction, not elsewhere classified   . Barrett's esophagus   . Osteoarthrosis, unspecified whether generalized or localized, unspecified site   . Pain in joint, shoulder region   . Osteoporosis, unspecified   . Shortness of breath   . Flatulence, eructation, and gas pain   . Urinary frequency   . Hyperglycemia   . Pressure ulcer, heel(707.07)   . Other specified disease of white blood cells 02/24/2013  . Acute conjunctivitis, unspecified 02/24/2013  . Pneumonia, organism unspecified 02/24/2013  . Hypotension, unspecified 02/21/2013  . Acute bronchitis 02/21/2013  . Hemorrhage of  gastrointestinal tract, unspecified 02/21/2013  . Altered mental status 02/21/2013  . Contusion of face, scalp, and neck except eye(s) 06/13/2012  . Unspecified fall 06/13/2012  . Hyposmolality and/or hyponatremia 05/17/2012  . Joint pain of lower extremity 04/15/2012  . Acute upper respiratory infections of unspecified site 04/24/2011  . Hordeolum externum 03/31/2011  . Contact dermatitis and other eczema, due to unspecified cause 09/26/2010  . Candidiasis of other urogenital sites 07/15/2010  . Rash and other nonspecific skin eruption 07/15/2010  .  Unspecified vitamin D deficiency 05/07/2010  . Altered mental status 04/11/2010  . Closed fracture of distal end of ulna (alone) 04/12  . Other specified disease of nail 08/16/2009  . Disorders of bursae and tendons in shoulder region, unspecified 06/21/2009  . Pain in joint, pelvic region and thigh 04/12/2009  . Anemia, unspecified 04/05/2009  . Hip joint replacement by other means 03/23/2009  . Unspecified constipation 10/09/2008  . Other malaise and fatigue 11/09/2006  . Aortic aneurysm of unspecified site without mention of rupture 07/28/2005  . Osteoarthrosis, unspecified whether generalized or localized, unspecified site 07/28/2005  . Pain in joint, shoulder region 07/28/2005  . Osteoporosis, unspecified 07/28/2005  . Other emphysema 07/29/1999  . Raynaud's syndrome 07/28/1998   Past Surgical History  Procedure Laterality Date  . Dilation and curettage of uterus    . Colon surgery  2001    partial colon resecton for cancer  . Cataract extraction w/ intraocular lens  implant, bilateral  2004  . Total hip arthroplasty Left 2010   Social History:   reports that she has never smoked. She does not have any smokeless tobacco history on file. She reports that she does not drink alcohol. Her drug history is not on file.  Family History  Problem Relation Age of Onset  . Stroke Mother     Medications: Patient's Medications  New Prescriptions   No medications on file  Previous Medications   ACETAMINOPHEN (TYLENOL) 325 MG TABLET    Take 650 mg by mouth 2 (two) times daily. *Also takes 650mg  every 6 hours as needed*   CALCIUM-VITAMIN D-VITAMIN K (VIACTIV) 829-937-16 MG-UNT-MCG CHEW    Chew 1 tablet by mouth daily.   CITALOPRAM (CELEXA) 10 MG TABLET    Take 5 mg by mouth daily. Take /12 daily to help nerves and depression.   ERGOCALCIFEROL (VITAMIN D2) 50000 UNITS CAPSULE    Take 50,000 Units by mouth every 30 (thirty) days. *Takes on the 17th of each month*   LACTOSE FREE NUTRITION  (BOOST) LIQD    Take 237 mLs by mouth daily.   LISINOPRIL (PRINIVIL,ZESTRIL) 10 MG TABLET    Take 10 mg by mouth daily.   LORATADINE (CLARITIN) 10 MG TABLET    Take 10 mg by mouth daily.   LORAZEPAM (ATIVAN) 0.5 MG TABLET    Take one tablet by mouth every morning for anxiety   OMEPRAZOLE (PRILOSEC) 20 MG CAPSULE    Take 20 mg by mouth daily.    POLYETHYLENE GLYCOL (MIRALAX / GLYCOLAX) PACKET    Take 17 g by mouth daily.  Modified Medications   No medications on file  Discontinued Medications   No medications on file     Physical Exam: Physical Exam  Constitutional: She appears well-developed and well-nourished.  HENT:  Head: Normocephalic and atraumatic.  Eyes: Conjunctivae and EOM are normal. Pupils are equal, round, and reactive to light. Right eye exhibits no discharge. Left eye exhibits no discharge. No scleral icterus.  Watery and  red eyes.   Neck: Normal range of motion. Neck supple. No JVD present. No thyromegaly present.  Cardiovascular: Normal rate, regular rhythm and normal heart sounds.   No murmur heard. Pulmonary/Chest: Effort normal. She has no wheezes. She has rales (bibasilar dry rales appreciated).  Abdominal: Soft. Bowel sounds are normal. There is no tenderness.  Musculoskeletal: Normal range of motion. She exhibits no edema and no tenderness.  Neurological: She is alert. No cranial nerve deficit. She exhibits normal muscle tone. Coordination normal.  Skin: Skin is warm and dry. No rash noted.  Scratched injury nose and lower legs.    Psychiatric: Her mood appears anxious. Her affect is labile. Her affect is not angry. Her speech is tangential. Her speech is not slurred. She is agitated (when assisted with peronal care) and combative (when she doesn't understad what you want her to do). She is not aggressive, not hyperactive and not withdrawn. Thought content is not paranoid and not delusional. Cognition and memory are impaired. She expresses impulsivity and  inappropriate judgment. She does not exhibit a depressed mood. She exhibits abnormal recent memory and abnormal remote memory.    Filed Vitals:   08/27/14 0930  BP: 118/78  Pulse: 68  Temp: 97.9 F (36.6 C)  TempSrc: Tympanic  Resp: 14      Labs reviewed: Basic Metabolic Panel:  Recent Labs  03/20/14  NA 137  K 4.4  BUN 26*  CREATININE 0.8  TSH 0.74   Liver Function Tests:  Recent Labs  03/20/14  AST 12*  ALT 8  ALKPHOS 101   CBC:  Recent Labs  03/20/14  WBC 6.2  HGB 11.8*  HCT 34*  PLT 288   Assessment/Plan Seasonal allergies Red/watery eyes and itching BLE-will try Claritin 10mg  daily. Observe. Continue artificial tears.   Anxiety state, unspecified Manageable with Celexa 5mg  and Ativan 0.5mg  qam/0.25mg  bid prn.     Unspecified essential hypertension Controlled and continue with Lisinopril 10mg     Unspecified constipation Stable on MiraLax daily    GERD (gastroesophageal reflux disease) Stable on Omeprazole 20mg  daily.     Osteoarthrosis, unspecified whether generalized or localized, unspecified site Tylenol 650mg  bid is adequate.       Family/ Staff Communication: observe the patient  Goals of Care: SNF  Labs/tests ordered: none

## 2014-08-27 NOTE — Assessment & Plan Note (Signed)
Red/watery eyes and itching BLE-will try Claritin 10mg  daily. Observe. Continue artificial tears.

## 2014-08-27 NOTE — Assessment & Plan Note (Signed)
Stable on Omeprazole 20 mg daily.

## 2014-08-27 NOTE — Assessment & Plan Note (Signed)
Stable on MiraLax daily.  

## 2014-08-27 NOTE — Assessment & Plan Note (Signed)
Tylenol 650mg  bid is adequate.

## 2014-08-27 NOTE — Assessment & Plan Note (Signed)
Manageable with Celexa 5mg and Ativan 0.5mg qam/0.25mg bid prn.  

## 2014-09-12 ENCOUNTER — Encounter: Payer: Self-pay | Admitting: Nurse Practitioner

## 2014-09-12 ENCOUNTER — Non-Acute Institutional Stay (SKILLED_NURSING_FACILITY): Payer: Medicare Other | Admitting: Nurse Practitioner

## 2014-09-12 DIAGNOSIS — K219 Gastro-esophageal reflux disease without esophagitis: Secondary | ICD-10-CM

## 2014-09-12 DIAGNOSIS — K5900 Constipation, unspecified: Secondary | ICD-10-CM

## 2014-09-12 DIAGNOSIS — I10 Essential (primary) hypertension: Secondary | ICD-10-CM

## 2014-09-12 DIAGNOSIS — J309 Allergic rhinitis, unspecified: Secondary | ICD-10-CM

## 2014-09-12 DIAGNOSIS — J302 Other seasonal allergic rhinitis: Secondary | ICD-10-CM

## 2014-09-12 DIAGNOSIS — F411 Generalized anxiety disorder: Secondary | ICD-10-CM

## 2014-09-12 DIAGNOSIS — M1990 Unspecified osteoarthritis, unspecified site: Secondary | ICD-10-CM

## 2014-09-12 NOTE — Assessment & Plan Note (Signed)
Controlled and continue with Lisinopril 10mg  

## 2014-09-12 NOTE — Assessment & Plan Note (Signed)
Manageable with Celexa 5mg  and Ativan 0.5mg  qam/0.25mg  bid prn.  09/12/14 GDR decreased Lorazepam to 0.25mg  qam and bid prn-consultant pharmacist.

## 2014-09-12 NOTE — Assessment & Plan Note (Signed)
Stable on Omeprazole 20 mg daily.

## 2014-09-12 NOTE — Assessment & Plan Note (Signed)
Tylenol 650mg  bid is adequate.

## 2014-09-12 NOTE — Assessment & Plan Note (Signed)
Stable on MiraLax daily.  

## 2014-09-12 NOTE — Assessment & Plan Note (Signed)
Red/watery eyes and itching BLE-better since Claritin 10mg  daily. Observe. Continue artificial tears.

## 2014-09-12 NOTE — Progress Notes (Signed)
Patient ID: [name], female   DOB: [birth year], 80 y.o.   MRN: [id number]   Code Status: DNR  Allergies  Allergen Reactions  . Morphine And Related     Per MAR  . Oxytrol [Oxybutynin]     Per [facility]    Chief Complaint  Patient presents with  . Medical Management of Chronic Issues    HPI: Patient is a 80 y.o. female seen in the SNF at [facility] today for evaluation of chronic medical conditions.  Problem List Items Addressed This Visit   Anxiety state, unspecified - Primary     Manageable with Celexa 5mg  and Ativan 0.5mg  qam/0.25mg  bid prn.  09/12/14 GDR decreased Lorazepam to 0.25mg  qam and bid prn-consultant pharmacist.      Relevant Medications      LORazepam (ATIVAN) 0.5 MG tablet   GERD (gastroesophageal reflux disease)     Stable on Omeprazole 20mg  daily.        Osteoarthrosis, unspecified whether generalized or localized, unspecified site     Tylenol 650mg  bid is adequate.       Seasonal allergies     Red/watery eyes and itching BLE-better since Claritin 10mg  daily. Observe. Continue artificial tears.      Unspecified constipation     Stable on MiraLax daily      Unspecified essential hypertension     Controlled and continue with Lisinopril 10mg           Review of Systems:  Review of Systems  Constitutional: Negative for fever and weight loss.       Itching legs with scratched marks. Right lateral lower leg rash. Nape rash-resolved.    HENT: Negative for congestion and hearing loss.        Watery and red eyes  Eyes: Negative.        Watery and red eyes-resolved.   Respiratory: Negative for cough, sputum production and wheezing.   Cardiovascular: Negative for chest pain, orthopnea and leg swelling.  Gastrointestinal: Negative for nausea, vomiting, abdominal pain, diarrhea and constipation.  Genitourinary: Positive for frequency (incontinent of bladder) and pelvic pain. Negative for urgency and flank pain.  Musculoskeletal: Positive  for falls (if not supervised). Negative for back pain, joint pain and neck pain.       Last fall 03/13/14 resulted in left forehead laceration-healed.   Skin: Positive for itching. Negative for rash.       Scratched injury nose and lower legs-healed.   Neurological: Negative for tremors, speech change, focal weakness, seizures, loss of consciousness and weakness.  Endo/Heme/Allergies: Negative for polydipsia. Does not bruise/bleed easily.  Psychiatric/Behavioral: Positive for memory loss. Negative for depression and hallucinations. The patient is nervous/anxious (more freqeunt restlessness). The patient does not have insomnia.      Past Medical History  Diagnosis Date  . Malignant neoplasm of colon, unspecified site   . Anxiety state, unspecified   . Alzheimer's disease   . Unspecified essential hypertension   . Chronic airway obstruction, not elsewhere classified   . Barrett's esophagus   . Osteoarthrosis, unspecified whether generalized or localized, unspecified site   . Pain in joint, shoulder region   . Osteoporosis, unspecified   . Shortness of breath   . Flatulence, eructation, and gas pain   . Urinary frequency   . Hyperglycemia   . Pressure ulcer, heel(707.07)   . Other specified disease of white blood cells 02/24/2013  . Acute conjunctivitis, unspecified 02/24/2013  . Pneumonia, organism unspecified 02/24/2013  . Hypotension, unspecified  02/21/2013  . Acute bronchitis 02/21/2013  . Hemorrhage of gastrointestinal tract, unspecified 02/21/2013  . Altered mental status 02/21/2013  . Contusion of face, scalp, and neck except eye(s) 06/13/2012  . Unspecified fall 06/13/2012  . Hyposmolality and/or hyponatremia 05/17/2012  . Joint pain of lower extremity 04/15/2012  . Acute upper respiratory infections of unspecified site 04/24/2011  . Hordeolum externum 03/31/2011  . Contact dermatitis and other eczema, due to unspecified cause 09/26/2010  . Candidiasis of other urogenital  sites 07/15/2010  . Rash and other nonspecific skin eruption 07/15/2010  . Unspecified vitamin D deficiency 05/07/2010  . Altered mental status 04/11/2010  . Closed fracture of distal end of ulna (alone) 04/12  . Other specified disease of nail 08/16/2009  . Disorders of bursae and tendons in shoulder region, unspecified 06/21/2009  . Pain in joint, pelvic region and thigh 04/12/2009  . Anemia, unspecified 04/05/2009  . Hip joint replacement by other means 03/23/2009  . Unspecified constipation 10/09/2008  . Other malaise and fatigue 11/09/2006  . Aortic aneurysm of unspecified site without mention of rupture 07/28/2005  . Osteoarthrosis, unspecified whether generalized or localized, unspecified site 07/28/2005  . Pain in joint, shoulder region 07/28/2005  . Osteoporosis, unspecified 07/28/2005  . Other emphysema 07/29/1999  . Raynaud's syndrome 07/28/1998   Past Surgical History  Procedure Laterality Date  . Dilation and curettage of uterus    . Colon surgery  2001    partial colon resecton for cancer  . Cataract extraction w/ intraocular lens  implant, bilateral  2004  . Total hip arthroplasty Left 2010   Social History:   reports that she has never smoked. She does not have any smokeless tobacco history on file. She reports that she does not drink alcohol. Her drug history is not on file.  Family History  Problem Relation Age of Onset  . Stroke Mother     Medications: Patient's Medications  New Prescriptions   No medications on file  Previous Medications   ACETAMINOPHEN (TYLENOL) 325 MG TABLET    Take 650 mg by mouth 2 (two) times daily. *Also takes 650mg  every 6 hours as needed*   CALCIUM-VITAMIN D-VITAMIN K (VIACTIV) 703-500-93 MG-UNT-MCG CHEW    Chew 1 tablet by mouth daily.   CITALOPRAM (CELEXA) 10 MG TABLET    Take 5 mg by mouth daily. Take /12 daily to help nerves and depression.   ERGOCALCIFEROL (VITAMIN D2) 50000 UNITS CAPSULE    Take 50,000 Units by mouth every  30 (thirty) days. *Takes on the 17th of each month*   LACTOSE FREE NUTRITION (BOOST) LIQD    Take 237 mLs by mouth daily.   LISINOPRIL (PRINIVIL,ZESTRIL) 10 MG TABLET    Take 10 mg by mouth daily.   LORATADINE (CLARITIN) 10 MG TABLET    Take 10 mg by mouth daily.   LORAZEPAM (ATIVAN) 0.5 MG TABLET    Take 0.25 mg by mouth every morning. 0.25mg  bid prn for anxiety.   OMEPRAZOLE (PRILOSEC) 20 MG CAPSULE    Take 20 mg by mouth daily.    POLYETHYLENE GLYCOL (MIRALAX / GLYCOLAX) PACKET    Take 17 g by mouth daily.  Modified Medications   No medications on file  Discontinued Medications   LORAZEPAM (ATIVAN) 0.5 MG TABLET    Take one tablet by mouth every morning for anxiety     Physical Exam: Physical Exam  Constitutional: She appears well-developed and well-nourished.  HENT:  Head: Normocephalic and atraumatic.  Eyes: Conjunctivae and EOM  are normal. Pupils are equal, round, and reactive to light. Right eye exhibits no discharge. Left eye exhibits no discharge. No scleral icterus.  Neck: Normal range of motion. Neck supple. No JVD present. No thyromegaly present.  Cardiovascular: Normal rate, regular rhythm and normal heart sounds.   No murmur heard. Pulmonary/Chest: Effort normal. She has no wheezes. She has rales (bibasilar dry rales appreciated).  Abdominal: Soft. Bowel sounds are normal. There is no tenderness.  Musculoskeletal: Normal range of motion. She exhibits no edema and no tenderness.  Neurological: She is alert. No cranial nerve deficit. She exhibits normal muscle tone. Coordination normal.  Skin: Skin is warm and dry. No rash noted.  Scratched injury nose and lower legs.    Psychiatric: Her mood appears anxious. Her affect is labile. Her affect is not angry. Her speech is tangential. Her speech is not slurred. She is agitated (when assisted with peronal care) and combative (when she doesn't understad what you want her to do). She is not aggressive, not hyperactive and not  withdrawn. Thought content is not paranoid and not delusional. Cognition and memory are impaired. She expresses impulsivity and inappropriate judgment. She does not exhibit a depressed mood. She exhibits abnormal recent memory and abnormal remote memory.    Filed Vitals:   09/12/14 1109  BP: 120/70  Pulse: 70  Temp: 97 F (36.1 C)  TempSrc: Tympanic  Resp: 18      Labs reviewed: Basic Metabolic Panel:  Recent Labs  03/20/14  NA 137  K 4.4  BUN 26*  CREATININE 0.8  TSH 0.74   Liver Function Tests:  Recent Labs  03/20/14  AST 12*  ALT 8  ALKPHOS 101   CBC:  Recent Labs  03/20/14  WBC 6.2  HGB 11.8*  HCT 34*  PLT 288   Assessment/Plan Anxiety state, unspecified Manageable with Celexa 5mg  and Ativan 0.5mg  qam/0.25mg  bid prn.  09/12/14 GDR decreased Lorazepam to 0.25mg  qam and bid prn-consultant pharmacist.    GERD (gastroesophageal reflux disease) Stable on Omeprazole 20mg  daily.      Osteoarthrosis, unspecified whether generalized or localized, unspecified site Tylenol 650mg  bid is adequate.     Seasonal allergies Red/watery eyes and itching BLE-better since Claritin 10mg  daily. Observe. Continue artificial tears.    Unspecified constipation Stable on MiraLax daily    Unspecified essential hypertension Controlled and continue with Lisinopril 10mg        Family/ Staff Communication: observe the patient  Goals of Care: SNF  Labs/tests ordered: none

## 2014-10-17 ENCOUNTER — Encounter: Payer: Self-pay | Admitting: Nurse Practitioner

## 2014-10-17 ENCOUNTER — Non-Acute Institutional Stay (SKILLED_NURSING_FACILITY): Payer: Medicare Other | Admitting: Nurse Practitioner

## 2014-10-17 DIAGNOSIS — M150 Primary generalized (osteo)arthritis: Secondary | ICD-10-CM

## 2014-10-17 DIAGNOSIS — F411 Generalized anxiety disorder: Secondary | ICD-10-CM

## 2014-10-17 DIAGNOSIS — G309 Alzheimer's disease, unspecified: Secondary | ICD-10-CM

## 2014-10-17 DIAGNOSIS — J302 Other seasonal allergic rhinitis: Secondary | ICD-10-CM

## 2014-10-17 DIAGNOSIS — M159 Polyosteoarthritis, unspecified: Secondary | ICD-10-CM

## 2014-10-17 DIAGNOSIS — I10 Essential (primary) hypertension: Secondary | ICD-10-CM

## 2014-10-17 DIAGNOSIS — F0280 Dementia in other diseases classified elsewhere without behavioral disturbance: Secondary | ICD-10-CM

## 2014-10-17 DIAGNOSIS — K219 Gastro-esophageal reflux disease without esophagitis: Secondary | ICD-10-CM

## 2014-10-17 IMAGING — CT CT CERVICAL SPINE W/O CM
3 of 10 series · 10 of 33 positions shown, 11 images · non-contrast
Comparison: CT head 06/13/2012

CLINICAL DATA: Unwitnessed fall normal found on floor, frontal
laceration, no loss of consciousness, history severe dementia, colon
cancer, Alzheimer's, hypertension

EXAM:
CT HEAD WITHOUT CONTRAST
CT CERVICAL SPINE WITHOUT CONTRAST
TECHNIQUE: Multidetector CT imaging of the head and cervical spine was
performed following the standard protocol without intravenous
contrast. Multiplanar CT image reconstructions of the cervical spine
were also generated.

[1 coronal · coronal · 0.51mm/px · 2 of 64 slices shown]
[im 22/64  bone]
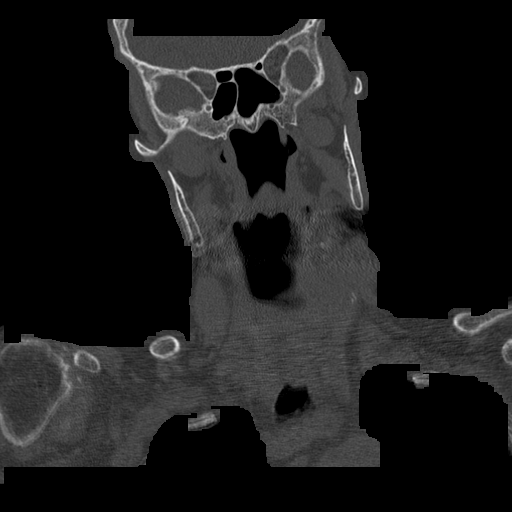
[im 44/64  bone]
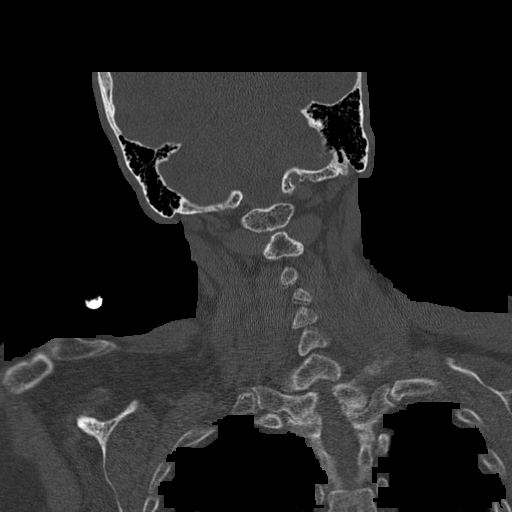

[1 orthgonal · axial · 0.51mm/px · z∈[-149,+34]mm · 3 of 97 slices shown, 4 images]
[im 1/97  soft-tissue]
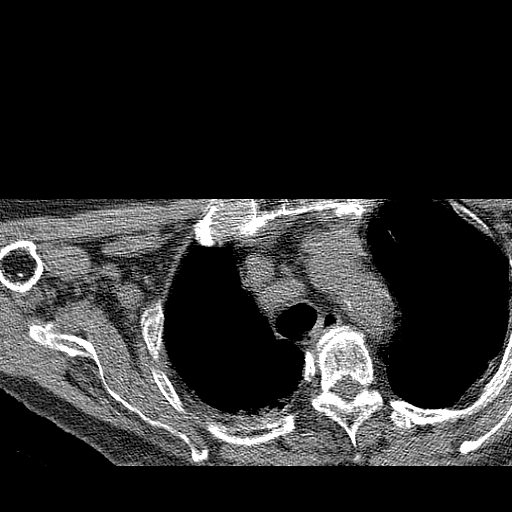
[im 1/97  bone]
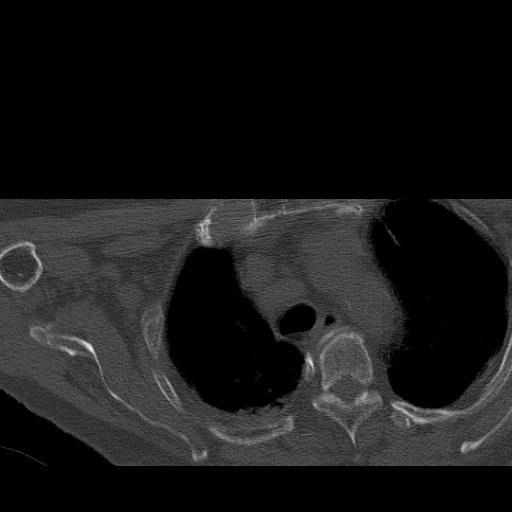
[im 49/97  bone]
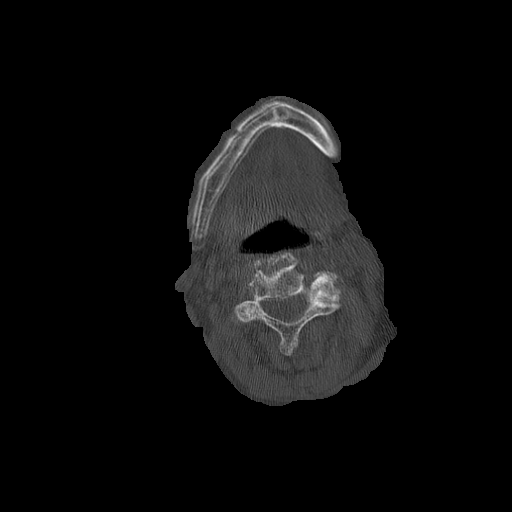
[im 97/97  bone]
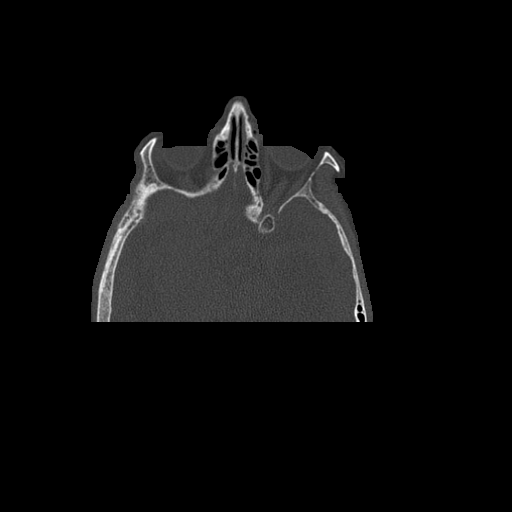

[2 sagittal · sagittal · 0.41mm/px · 5 of 70 slices shown]
[im 12/70  bone]
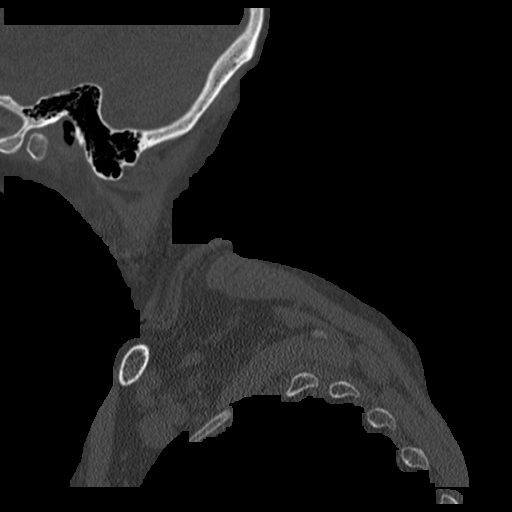
[im 24/70  bone]
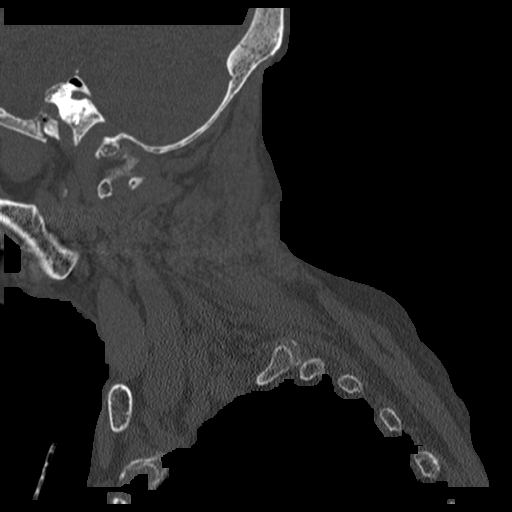
[im 35/70  bone]
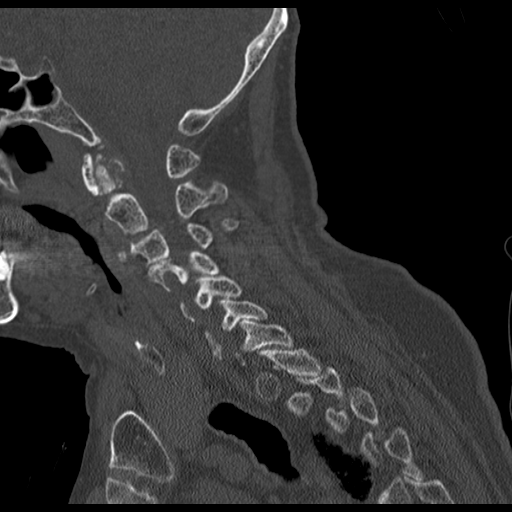
[im 47/70  bone]
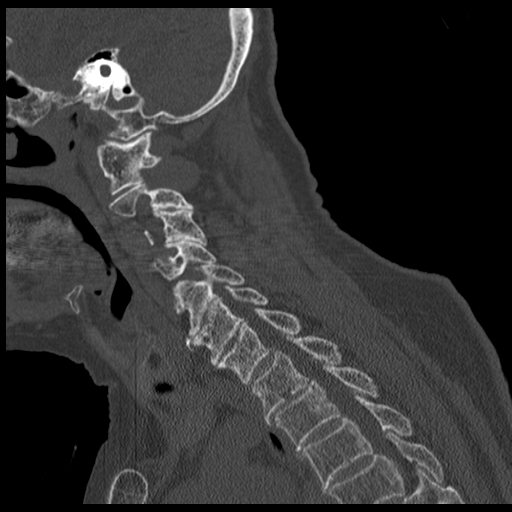
[im 58/70  bone]
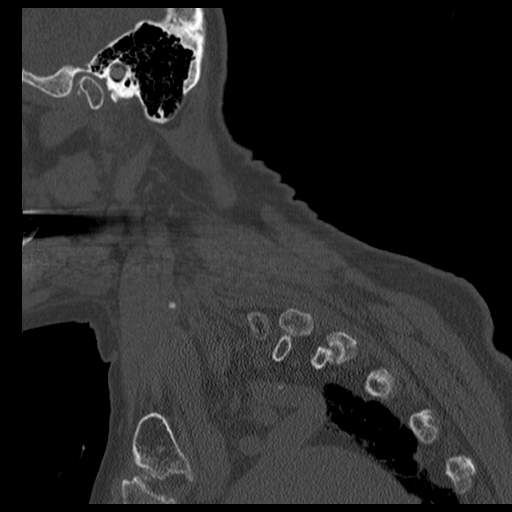

[10 of 33 positions shown; findings below may reference images not displayed]

FINDINGS: CT HEAD FINDINGS

Generalized atrophy.

Normal ventricular morphology.

No midline shift or mass effect.

Small vessel chronic ischemic changes of deep cerebral white matter.

No intracranial hemorrhage, mass lesion, or acute infarction.

Visualized paranasal sinuses and mastoid air cells clear.

Bones unremarkable.

Asymmetric positioning in gantry.

Beam hardening artifacts secondary to artifacts within imaged field.

CT CERVICAL SPINE FINDINGS

Motion artifacts, for which repeat imaging was performed.

Diffuse osseous demineralization.

Multilevel disc space narrowing and endplate spur formation.

Mild scattered facet degenerative changes.

Prevertebral soft tissues normal thickness.

Visualized skullbase intact.

Vertebral body heights maintained without fracture or subluxation.

Biapical lung scarring.
IMPRESSION: Atrophy with small vessel chronic ischemic changes of deep cerebral
white matter.

No acute intracranial abnormalities.

Multilevel degenerative disc and facet disease changes of the
cervical spine.

No acute cervical spine abnormalities.

## 2014-10-17 NOTE — Assessment & Plan Note (Signed)
Manageable with Celexa 5mg and Ativan 0.5mg qam/0.25mg bid prn.  

## 2014-10-17 NOTE — Assessment & Plan Note (Signed)
Red/watery eyes and itching BLE-better since Claritin 10mg  daily. Observe. Continue artificial tears.

## 2014-10-17 NOTE — Progress Notes (Signed)
Patient ID: [name], female   DOB: [birth year], 80 y.o.   MRN: [id number]   Code Status: DNR  Allergies  Allergen Reactions  . Morphine And Related     Per MAR  . Oxytrol [Oxybutynin]     Per [facility]    Chief Complaint  Patient presents with  . Medical Management of Chronic Issues    HPI: Patient is a 80 y.o. female seen in the SNF at [facility] today for evaluation of chronic medical conditions.  Problem List Items Addressed This Visit   Seasonal allergies     Red/watery eyes and itching BLE-better since Claritin 10mg  daily. Observe. Continue artificial tears.       Osteoarthritis - Primary     Tylenol 650mg  bid is adequate.     GERD (gastroesophageal reflux disease)     Stable on Omeprazole 20mg  daily.       Essential hypertension     Controlled and continue with Lisinopril 10mg       Anxiety state     Manageable with Celexa 5mg  and Ativan 0.5mg  qam/0.25mg  bid prn.        Alzheimer's disease     End stage. Unable to voice her own needs. Total dependent of her ADLs. She is not aware of her surroundings.        Review of Systems:  Review of Systems  Constitutional: Negative for fever and weight loss.       Itching legs with scratched marks. Right lateral lower leg rash. Nape rash-resolved.    HENT: Negative for congestion and hearing loss.   Eyes: Negative.        Watery and red eyes-resolved.   Respiratory: Negative for cough, sputum production and wheezing.   Cardiovascular: Negative for chest pain, orthopnea and leg swelling.  Gastrointestinal: Negative for nausea, vomiting, abdominal pain, diarrhea and constipation.  Genitourinary: Positive for frequency (incontinent of bladder) and pelvic pain. Negative for urgency and flank pain.  Musculoskeletal: Positive for falls (if not supervised). Negative for back pain, joint pain and neck pain.       Last fall 03/13/14 resulted in left forehead laceration-healed.   Skin: Negative for itching and  rash.  Neurological: Negative for tremors, speech change, focal weakness, seizures, loss of consciousness and weakness.  Endo/Heme/Allergies: Negative for polydipsia. Does not bruise/bleed easily.  Psychiatric/Behavioral: Positive for memory loss. Negative for depression and hallucinations. The patient is nervous/anxious (more freqeunt restlessness). The patient does not have insomnia.      Past Medical History  Diagnosis Date  . Malignant neoplasm of colon, unspecified site   . Anxiety state, unspecified   . Alzheimer's disease   . Unspecified essential hypertension   . Chronic airway obstruction, not elsewhere classified   . Barrett's esophagus   . Osteoarthrosis, unspecified whether generalized or localized, unspecified site   . Pain in joint, shoulder region   . Osteoporosis, unspecified   . Shortness of breath   . Flatulence, eructation, and gas pain   . Urinary frequency   . Hyperglycemia   . Pressure ulcer, heel(707.07)   . Other specified disease of white blood cells 02/24/2013  . Acute conjunctivitis, unspecified 02/24/2013  . Pneumonia, organism unspecified 02/24/2013  . Hypotension, unspecified 02/21/2013  . Acute bronchitis 02/21/2013  . Hemorrhage of gastrointestinal tract, unspecified 02/21/2013  . Altered mental status 02/21/2013  . Contusion of face, scalp, and neck except eye(s) 06/13/2012  . Unspecified fall 06/13/2012  . Hyposmolality and/or hyponatremia 05/17/2012  . Joint  pain of lower extremity 04/15/2012  . Acute upper respiratory infections of unspecified site 04/24/2011  . Hordeolum externum 03/31/2011  . Contact dermatitis and other eczema, due to unspecified cause 09/26/2010  . Candidiasis of other urogenital sites 07/15/2010  . Rash and other nonspecific skin eruption 07/15/2010  . Unspecified vitamin D deficiency 05/07/2010  . Altered mental status 04/11/2010  . Closed fracture of distal end of ulna (alone) 04/12  . Other specified disease of  nail 08/16/2009  . Disorders of bursae and tendons in shoulder region, unspecified 06/21/2009  . Pain in joint, pelvic region and thigh 04/12/2009  . Anemia, unspecified 04/05/2009  . Hip joint replacement by other means 03/23/2009  . Unspecified constipation 10/09/2008  . Other malaise and fatigue 11/09/2006  . Aortic aneurysm of unspecified site without mention of rupture 07/28/2005  . Osteoarthrosis, unspecified whether generalized or localized, unspecified site 07/28/2005  . Pain in joint, shoulder region 07/28/2005  . Osteoporosis, unspecified 07/28/2005  . Other emphysema 07/29/1999  . Raynaud's syndrome 07/28/1998   Past Surgical History  Procedure Laterality Date  . Dilation and curettage of uterus    . Colon surgery  2001    partial colon resecton for cancer  . Cataract extraction w/ intraocular lens  implant, bilateral  2004  . Total hip arthroplasty Left 2010   Social History:   reports that she has never smoked. She does not have any smokeless tobacco history on file. She reports that she does not drink alcohol. Her drug history is not on file.  Family History  Problem Relation Age of Onset  . Stroke Mother     Medications: Patient's Medications  New Prescriptions   No medications on file  Previous Medications   ACETAMINOPHEN (TYLENOL) 325 MG TABLET    Take 650 mg by mouth 2 (two) times daily. *Also takes 650mg  every 6 hours as needed*   CALCIUM-VITAMIN D-VITAMIN K (VIACTIV) 308-657-84 MG-UNT-MCG CHEW    Chew 1 tablet by mouth daily.   CITALOPRAM (CELEXA) 10 MG TABLET    Take 5 mg by mouth daily. Take /12 daily to help nerves and depression.   ERGOCALCIFEROL (VITAMIN D2) 50000 UNITS CAPSULE    Take 50,000 Units by mouth every 30 (thirty) days. *Takes on the 17th of each month*   LACTOSE FREE NUTRITION (BOOST) LIQD    Take 237 mLs by mouth daily.   LISINOPRIL (PRINIVIL,ZESTRIL) 10 MG TABLET    Take 10 mg by mouth daily.   LORATADINE (CLARITIN) 10 MG TABLET    Take  10 mg by mouth daily.   LORAZEPAM (ATIVAN) 0.5 MG TABLET    Take 0.25 mg by mouth every morning. 0.25mg  bid prn for anxiety.   OMEPRAZOLE (PRILOSEC) 20 MG CAPSULE    Take 20 mg by mouth daily.    POLYETHYLENE GLYCOL (MIRALAX / GLYCOLAX) PACKET    Take 17 g by mouth daily.  Modified Medications   No medications on file  Discontinued Medications   No medications on file     Physical Exam: Physical Exam  Constitutional: She appears well-developed and well-nourished.  HENT:  Head: Normocephalic and atraumatic.  Eyes: Conjunctivae and EOM are normal. Pupils are equal, round, and reactive to light. Right eye exhibits no discharge. Left eye exhibits no discharge. No scleral icterus.  Neck: Normal range of motion. Neck supple. No JVD present. No thyromegaly present.  Cardiovascular: Normal rate, regular rhythm and normal heart sounds.   No murmur heard. Pulmonary/Chest: Effort normal. She has no  wheezes. She has rales (bibasilar dry rales appreciated).  Abdominal: Soft. Bowel sounds are normal. There is no tenderness.  Musculoskeletal: Normal range of motion. She exhibits no edema and no tenderness.  Neurological: She is alert. No cranial nerve deficit. She exhibits normal muscle tone. Coordination normal.  Skin: Skin is warm and dry. No rash noted.     Psychiatric: Her mood appears anxious. Her affect is labile. Her affect is not angry. Her speech is tangential. Her speech is not slurred. She is agitated (when assisted with peronal care) and combative (when she doesn't understad what you want her to do). She is not aggressive, not hyperactive and not withdrawn. Thought content is not paranoid and not delusional. Cognition and memory are impaired. She expresses impulsivity and inappropriate judgment. She does not exhibit a depressed mood. She exhibits abnormal recent memory and abnormal remote memory.    Filed Vitals:   10/17/14 1055  BP: 136/72  Pulse: 66  Temp: 97.6 F (36.4 C)    TempSrc: Tympanic  Resp: 18      Labs reviewed: Basic Metabolic Panel:  Recent Labs  03/20/14  NA 137  K 4.4  BUN 26*  CREATININE 0.8  TSH 0.74   Liver Function Tests:  Recent Labs  03/20/14  AST 12*  ALT 8  ALKPHOS 101   CBC:  Recent Labs  03/20/14  WBC 6.2  HGB 11.8*  HCT 34*  PLT 288   Assessment/Plan Essential hypertension Controlled and continue with Lisinopril 10mg     Seasonal allergies Red/watery eyes and itching BLE-better since Claritin 10mg  daily. Observe. Continue artificial tears.     Osteoarthritis Tylenol 650mg  bid is adequate.   GERD (gastroesophageal reflux disease) Stable on Omeprazole 20mg  daily.     Anxiety state Manageable with Celexa 5mg  and Ativan 0.5mg  qam/0.25mg  bid prn.      Alzheimer's disease End stage. Unable to voice her own needs. Total dependent of her ADLs. She is not aware of her surroundings.     Family/ Staff Communication: observe the patient  Goals of Care: SNF  Labs/tests ordered: none

## 2014-10-17 NOTE — Assessment & Plan Note (Signed)
Tylenol 650mg  bid is adequate.

## 2014-10-17 NOTE — Assessment & Plan Note (Signed)
End stage. Unable to voice her own needs. Total dependent of her ADLs. She is not aware of her surroundings.   

## 2014-10-17 NOTE — Assessment & Plan Note (Signed)
Stable on Omeprazole 20 mg daily.

## 2014-10-17 NOTE — Assessment & Plan Note (Signed)
Controlled and continue with Lisinopril 10mg  

## 2014-11-19 ENCOUNTER — Encounter: Payer: Self-pay | Admitting: Nurse Practitioner

## 2014-11-19 ENCOUNTER — Non-Acute Institutional Stay (SKILLED_NURSING_FACILITY): Payer: Medicare Other | Admitting: Nurse Practitioner

## 2014-11-19 DIAGNOSIS — M159 Polyosteoarthritis, unspecified: Secondary | ICD-10-CM

## 2014-11-19 DIAGNOSIS — I10 Essential (primary) hypertension: Secondary | ICD-10-CM

## 2014-11-19 DIAGNOSIS — G309 Alzheimer's disease, unspecified: Secondary | ICD-10-CM

## 2014-11-19 DIAGNOSIS — M150 Primary generalized (osteo)arthritis: Secondary | ICD-10-CM

## 2014-11-19 DIAGNOSIS — K219 Gastro-esophageal reflux disease without esophagitis: Secondary | ICD-10-CM

## 2014-11-19 DIAGNOSIS — F0280 Dementia in other diseases classified elsewhere without behavioral disturbance: Secondary | ICD-10-CM

## 2014-11-19 DIAGNOSIS — F411 Generalized anxiety disorder: Secondary | ICD-10-CM

## 2014-11-19 DIAGNOSIS — K5900 Constipation, unspecified: Secondary | ICD-10-CM

## 2014-11-19 DIAGNOSIS — J302 Other seasonal allergic rhinitis: Secondary | ICD-10-CM

## 2014-11-19 NOTE — Assessment & Plan Note (Signed)
Manageable with Celexa 5mg and Ativan 0.5mg qam/0.25mg bid prn.  

## 2014-11-19 NOTE — Assessment & Plan Note (Signed)
Stable on Omeprazole 20 mg daily.

## 2014-11-19 NOTE — Assessment & Plan Note (Signed)
Tylenol 650mg  bid is adequate.

## 2014-11-19 NOTE — Assessment & Plan Note (Signed)
End stage. Unable to voice her own needs. Total dependent of her ADLs. She is not aware of her surroundings.   

## 2014-11-19 NOTE — Assessment & Plan Note (Signed)
Controlled and continue with Lisinopril 10mg  

## 2014-11-19 NOTE — Progress Notes (Signed)
Patient ID: [name], female   DOB: 09-Jul-1917, 80 y.o.   MRN: [id number]   Code Status: DNR  Allergies  Allergen Reactions  . Morphine And Related     Per MAR  . Oxytrol [Oxybutynin]     Per [facility]    Chief Complaint  Patient presents with  . Medical Management of Chronic Issues    HPI: Patient is a 80 y.o. female seen in the SNF at [facility] today for evaluation of chronic medical conditions.  Problem List Items Addressed This Visit    Seasonal allergies - Primary    Red/watery eyes and itching BLE-better since Claritin 10mg  daily. Observe. Continue artificial tears.       Osteoarthritis    Tylenol 650mg  bid is adequate.        GERD (gastroesophageal reflux disease)    Stable on Omeprazole 20mg  daily.       Essential hypertension    Controlled and continue with Lisinopril 10mg        Constipation    Stable on MiraLax daily     Anxiety state    Manageable with Celexa 5mg  and Ativan 0.5mg  qam/0.25mg  bid prn.       Alzheimer's disease    End stage. Unable to voice her own needs. Total dependent of her ADLs. She is not aware of her surroundings.         Review of Systems:  Review of Systems  Constitutional: Negative for fever and weight loss.       Itching legs with scratched marks. Right lateral lower leg rash. Nape rash-resolved.    HENT: Negative for congestion and hearing loss.   Eyes: Negative.        Watery and red eyes-resolved.   Respiratory: Negative for cough, sputum production and wheezing.   Cardiovascular: Negative for chest pain, orthopnea and leg swelling.  Gastrointestinal: Negative for nausea, vomiting, abdominal pain, diarrhea and constipation.  Genitourinary: Positive for frequency (incontinent of bladder) and pelvic pain. Negative for urgency and flank pain.  Musculoskeletal: Positive for falls (if not supervised). Negative for back pain, joint pain and neck pain.       Last fall 03/13/14 resulted in left forehead  laceration-healed.   Skin: Negative for itching and rash.  Neurological: Negative for tremors, speech change, focal weakness, seizures, loss of consciousness and weakness.  Endo/Heme/Allergies: Negative for polydipsia. Does not bruise/bleed easily.  Psychiatric/Behavioral: Positive for memory loss. Negative for depression and hallucinations. The patient is nervous/anxious (more freqeunt restlessness). The patient does not have insomnia.      Past Medical History  Diagnosis Date  . Malignant neoplasm of colon, unspecified site   . Anxiety state, unspecified   . Alzheimer's disease   . Unspecified essential hypertension   . Chronic airway obstruction, not elsewhere classified   . Barrett's esophagus   . Osteoarthrosis, unspecified whether generalized or localized, unspecified site   . Pain in joint, shoulder region   . Osteoporosis, unspecified   . Shortness of breath   . Flatulence, eructation, and gas pain   . Urinary frequency   . Hyperglycemia   . Pressure ulcer, heel(707.07)   . Other specified disease of white blood cells 02/24/2013  . Acute conjunctivitis, unspecified 02/24/2013  . Pneumonia, organism unspecified 02/24/2013  . Hypotension, unspecified 02/21/2013  . Acute bronchitis 02/21/2013  . Hemorrhage of gastrointestinal tract, unspecified 02/21/2013  . Altered mental status 02/21/2013  . Contusion of face, scalp, and neck except eye(s) 06/13/2012  . Unspecified  fall 06/13/2012  . Hyposmolality and/or hyponatremia 05/17/2012  . Joint pain of lower extremity 04/15/2012  . Acute upper respiratory infections of unspecified site 04/24/2011  . Hordeolum externum 03/31/2011  . Contact dermatitis and other eczema, due to unspecified cause 09/26/2010  . Candidiasis of other urogenital sites 07/15/2010  . Rash and other nonspecific skin eruption 07/15/2010  . Unspecified vitamin D deficiency 05/07/2010  . Altered mental status 04/11/2010  . Closed fracture of distal end of  ulna (alone) 04/12  . Other specified disease of nail 08/16/2009  . Disorders of bursae and tendons in shoulder region, unspecified 06/21/2009  . Pain in joint, pelvic region and thigh 04/12/2009  . Anemia, unspecified 04/05/2009  . Hip joint replacement by other means 03/23/2009  . Unspecified constipation 10/09/2008  . Other malaise and fatigue 11/09/2006  . Aortic aneurysm of unspecified site without mention of rupture 07/28/2005  . Osteoarthrosis, unspecified whether generalized or localized, unspecified site 07/28/2005  . Pain in joint, shoulder region 07/28/2005  . Osteoporosis, unspecified 07/28/2005  . Other emphysema 07/29/1999  . Raynaud's syndrome 07/28/1998   Past Surgical History  Procedure Laterality Date  . Dilation and curettage of uterus    . Colon surgery  2001    partial colon resecton for cancer  . Cataract extraction w/ intraocular lens  implant, bilateral  2004  . Total hip arthroplasty Left 2010   Social History:   reports that she has never smoked. She does not have any smokeless tobacco history on file. She reports that she does not drink alcohol. Her drug history is not on file.  Family History  Problem Relation Age of Onset  . Stroke Mother     Medications: Patient's Medications  New Prescriptions   No medications on file  Previous Medications   ACETAMINOPHEN (TYLENOL) 325 MG TABLET    Take 650 mg by mouth 2 (two) times daily. *Also takes 650mg  every 6 hours as needed*   CALCIUM-VITAMIN D-VITAMIN K (VIACTIV) 099-833-82 MG-UNT-MCG CHEW    Chew 1 tablet by mouth daily.   CITALOPRAM (CELEXA) 10 MG TABLET    Take 5 mg by mouth daily. Take /12 daily to help nerves and depression.   ERGOCALCIFEROL (VITAMIN D2) 50000 UNITS CAPSULE    Take 50,000 Units by mouth every 30 (thirty) days. *Takes on the 17th of each month*   LACTOSE FREE NUTRITION (BOOST) LIQD    Take 237 mLs by mouth daily.   LISINOPRIL (PRINIVIL,ZESTRIL) 10 MG TABLET    Take 10 mg by mouth  daily.   LORATADINE (CLARITIN) 10 MG TABLET    Take 10 mg by mouth daily.   LORAZEPAM (ATIVAN) 0.5 MG TABLET    Take 0.25 mg by mouth every morning. 0.25mg  bid prn for anxiety.   OMEPRAZOLE (PRILOSEC) 20 MG CAPSULE    Take 20 mg by mouth daily.    POLYETHYLENE GLYCOL (MIRALAX / GLYCOLAX) PACKET    Take 17 g by mouth daily.  Modified Medications   No medications on file  Discontinued Medications   No medications on file     Physical Exam: Physical Exam  Constitutional: She appears well-developed and well-nourished.  HENT:  Head: Normocephalic and atraumatic.  Eyes: Conjunctivae and EOM are normal. Pupils are equal, round, and reactive to light. Right eye exhibits no discharge. Left eye exhibits no discharge. No scleral icterus.  Neck: Normal range of motion. Neck supple. No JVD present. No thyromegaly present.  Cardiovascular: Normal rate, regular rhythm and normal heart sounds.  No murmur heard. Pulmonary/Chest: Effort normal. She has no wheezes. She has rales (bibasilar dry rales appreciated).  Abdominal: Soft. Bowel sounds are normal. There is no tenderness.  Musculoskeletal: Normal range of motion. She exhibits no edema or tenderness.  Neurological: She is alert. No cranial nerve deficit. She exhibits normal muscle tone. Coordination normal.  Skin: Skin is warm and dry. No rash noted.     Psychiatric: Her mood appears anxious. Her affect is labile. Her affect is not angry. Her speech is tangential. Her speech is not slurred. She is agitated (when assisted with peronal care) and combative (when she doesn't understad what you want her to do). She is not aggressive, not hyperactive and not withdrawn. Thought content is not paranoid and not delusional. Cognition and memory are impaired. She expresses impulsivity and inappropriate judgment. She does not exhibit a depressed mood. She exhibits abnormal recent memory and abnormal remote memory.    Filed Vitals:   11/19/14 1045  BP:  100/64  Pulse: 64  Temp: 97.7 F (36.5 C)  TempSrc: Tympanic  Resp: 20      Labs reviewed: Basic Metabolic Panel:  Recent Labs  03/20/14  NA 137  K 4.4  BUN 26*  CREATININE 0.8  TSH 0.74   Liver Function Tests:  Recent Labs  03/20/14  AST 12*  ALT 8  ALKPHOS 101   CBC:  Recent Labs  03/20/14  WBC 6.2  HGB 11.8*  HCT 34*  PLT 288   Assessment/Plan Seasonal allergies Red/watery eyes and itching BLE-better since Claritin 10mg  daily. Observe. Continue artificial tears.     Constipation Stable on MiraLax daily   Osteoarthritis Tylenol 650mg  bid is adequate.      GERD (gastroesophageal reflux disease) Stable on Omeprazole 20mg  daily.     Essential hypertension Controlled and continue with Lisinopril 10mg      Anxiety state Manageable with Celexa 5mg  and Ativan 0.5mg  qam/0.25mg  bid prn.     Alzheimer's disease End stage. Unable to voice her own needs. Total dependent of her ADLs. She is not aware of her surroundings.      Family/ Staff Communication: observe the patient  Goals of Care: SNF  Labs/tests ordered: none

## 2014-11-19 NOTE — Assessment & Plan Note (Signed)
Stable on MiraLax daily.  

## 2014-11-19 NOTE — Assessment & Plan Note (Signed)
Red/watery eyes and itching BLE-better since Claritin 10mg  daily. Observe. Continue artificial tears.

## 2014-12-12 ENCOUNTER — Encounter: Payer: Self-pay | Admitting: Nurse Practitioner

## 2014-12-12 ENCOUNTER — Non-Acute Institutional Stay (SKILLED_NURSING_FACILITY): Payer: Medicare Other | Admitting: Nurse Practitioner

## 2014-12-12 DIAGNOSIS — K5900 Constipation, unspecified: Secondary | ICD-10-CM

## 2014-12-12 DIAGNOSIS — M150 Primary generalized (osteo)arthritis: Secondary | ICD-10-CM

## 2014-12-12 DIAGNOSIS — M159 Polyosteoarthritis, unspecified: Secondary | ICD-10-CM

## 2014-12-12 DIAGNOSIS — M810 Age-related osteoporosis without current pathological fracture: Secondary | ICD-10-CM

## 2014-12-12 DIAGNOSIS — G309 Alzheimer's disease, unspecified: Secondary | ICD-10-CM

## 2014-12-12 DIAGNOSIS — F411 Generalized anxiety disorder: Secondary | ICD-10-CM

## 2014-12-12 DIAGNOSIS — F0280 Dementia in other diseases classified elsewhere without behavioral disturbance: Secondary | ICD-10-CM

## 2014-12-12 DIAGNOSIS — K219 Gastro-esophageal reflux disease without esophagitis: Secondary | ICD-10-CM

## 2014-12-12 DIAGNOSIS — L299 Pruritus, unspecified: Secondary | ICD-10-CM

## 2014-12-12 DIAGNOSIS — I10 Essential (primary) hypertension: Secondary | ICD-10-CM

## 2014-12-12 NOTE — Assessment & Plan Note (Signed)
Manageable with Celexa 5mg and Ativan 0.5mg qam/0.25mg bid prn.  

## 2014-12-12 NOTE — Assessment & Plan Note (Signed)
Stable on MiraLax daily.  

## 2014-12-12 NOTE — Assessment & Plan Note (Signed)
Takes Vit D 3 50,000u monthly.

## 2014-12-12 NOTE — Assessment & Plan Note (Signed)
Tylenol 650mg bid is adequate. Prn Tylenol available to her.  

## 2014-12-12 NOTE — Assessment & Plan Note (Signed)
Controlled and continue with Lisinopril 10mg  

## 2014-12-12 NOTE — Assessment & Plan Note (Signed)
Stable on Omeprazole 20 mg daily.

## 2014-12-12 NOTE — Progress Notes (Signed)
Patient ID: [name], female   DOB: 12-Jul-1917, 80 y.o.   MRN: [id number]   Code Status: DNR  Allergies  Allergen Reactions  . Morphine And Related     Per MAR  . Oxytrol [Oxybutynin]     Per [facility]    Chief Complaint  Patient presents with  . Medical Management of Chronic Issues    HPI: Patient is a 80 y.o. female seen in the SNF at [facility] today for evaluation of chronic medical conditions.  Problem List Items Addressed This Visit    Pruritus    Stable itching and seasonal allergy symptoms. Continue Claritin 10mg  daily for now.     Osteoporosis    Takes Vit D 3 50,000u monthly.      Osteoarthritis    Tylenol 650mg  bid is adequate. Prn Tylenol available to her.      GERD (gastroesophageal reflux disease)    Stable on Omeprazole 20mg  daily.     Essential hypertension    Controlled and continue with Lisinopril 10mg       Constipation    Stable on MiraLax daily      Anxiety state    Manageable with Celexa 5mg  and Ativan 0.5mg  qam/0.25mg  bid prn.        Alzheimer's disease - Primary    End stage. Unable to voice her own needs. Total dependent of her ADLs. She is not aware of her surroundings.          Review of Systems:  Review of Systems  Constitutional: Negative for fever and weight loss.       Itching legs with scratched marks. Right lateral lower leg rash. Nape rash-resolved.    HENT: Negative for congestion and hearing loss.   Eyes: Negative.        Watery and red eyes-resolved.   Respiratory: Negative for cough, sputum production and wheezing.   Cardiovascular: Negative for chest pain, orthopnea and leg swelling.  Gastrointestinal: Negative for nausea, vomiting, abdominal pain, diarrhea and constipation.  Genitourinary: Positive for frequency (incontinent of bladder) and pelvic pain. Negative for urgency and flank pain.  Musculoskeletal: Positive for falls (if not supervised). Negative for back pain, joint pain and neck pain.   Last fall 03/13/14 resulted in left forehead laceration-healed.   Skin: Negative for itching and rash.  Neurological: Negative for tremors, speech change, focal weakness, seizures, loss of consciousness and weakness.  Endo/Heme/Allergies: Negative for polydipsia. Does not bruise/bleed easily.  Psychiatric/Behavioral: Positive for memory loss. Negative for depression and hallucinations. The patient is nervous/anxious (more freqeunt restlessness). The patient does not have insomnia.      Past Medical History  Diagnosis Date  . Malignant neoplasm of colon, unspecified site   . Anxiety state, unspecified   . Alzheimer's disease   . Unspecified essential hypertension   . Chronic airway obstruction, not elsewhere classified   . Barrett's esophagus   . Osteoarthrosis, unspecified whether generalized or localized, unspecified site   . Pain in joint, shoulder region   . Osteoporosis, unspecified   . Shortness of breath   . Flatulence, eructation, and gas pain   . Urinary frequency   . Hyperglycemia   . Pressure ulcer, heel(707.07)   . Other specified disease of white blood cells 02/24/2013  . Acute conjunctivitis, unspecified 02/24/2013  . Pneumonia, organism unspecified 02/24/2013  . Hypotension, unspecified 02/21/2013  . Acute bronchitis 02/21/2013  . Hemorrhage of gastrointestinal tract, unspecified 02/21/2013  . Altered mental status 02/21/2013  . Contusion of face,  scalp, and neck except eye(s) 06/13/2012  . Unspecified fall 06/13/2012  . Hyposmolality and/or hyponatremia 05/17/2012  . Joint pain of lower extremity 04/15/2012  . Acute upper respiratory infections of unspecified site 04/24/2011  . Hordeolum externum 03/31/2011  . Contact dermatitis and other eczema, due to unspecified cause 09/26/2010  . Candidiasis of other urogenital sites 07/15/2010  . Rash and other nonspecific skin eruption 07/15/2010  . Unspecified vitamin D deficiency 05/07/2010  . Altered mental status  04/11/2010  . Closed fracture of distal end of ulna (alone) 04/12  . Other specified disease of nail 08/16/2009  . Disorders of bursae and tendons in shoulder region, unspecified 06/21/2009  . Pain in joint, pelvic region and thigh 04/12/2009  . Anemia, unspecified 04/05/2009  . Hip joint replacement by other means 03/23/2009  . Unspecified constipation 10/09/2008  . Other malaise and fatigue 11/09/2006  . Aortic aneurysm of unspecified site without mention of rupture 07/28/2005  . Osteoarthrosis, unspecified whether generalized or localized, unspecified site 07/28/2005  . Pain in joint, shoulder region 07/28/2005  . Osteoporosis, unspecified 07/28/2005  . Other emphysema 07/29/1999  . Raynaud's syndrome 07/28/1998   Past Surgical History  Procedure Laterality Date  . Dilation and curettage of uterus    . Colon surgery  2001    partial colon resecton for cancer  . Cataract extraction w/ intraocular lens  implant, bilateral  2004  . Total hip arthroplasty Left 2010   Social History:   reports that she has never smoked. She does not have any smokeless tobacco history on file. She reports that she does not drink alcohol. Her drug history is not on file.  Family History  Problem Relation Age of Onset  . Stroke Mother     Medications: Patient's Medications  New Prescriptions   No medications on file  Previous Medications   ACETAMINOPHEN (TYLENOL) 325 MG TABLET    Take 650 mg by mouth 2 (two) times daily. *Also takes 650mg  every 6 hours as needed*   CALCIUM-VITAMIN D-VITAMIN K (VIACTIV) 161-096-04 MG-UNT-MCG CHEW    Chew 1 tablet by mouth daily.   CITALOPRAM (CELEXA) 10 MG TABLET    Take 5 mg by mouth daily. Take /12 daily to help nerves and depression.   ERGOCALCIFEROL (VITAMIN D2) 50000 UNITS CAPSULE    Take 50,000 Units by mouth every 30 (thirty) days. *Takes on the 17th of each month*   LACTOSE FREE NUTRITION (BOOST) LIQD    Take 237 mLs by mouth daily.   LISINOPRIL  (PRINIVIL,ZESTRIL) 10 MG TABLET    Take 10 mg by mouth daily.   LORATADINE (CLARITIN) 10 MG TABLET    Take 10 mg by mouth daily.   LORAZEPAM (ATIVAN) 0.5 MG TABLET    Take 0.25 mg by mouth every morning. 0.25mg  bid prn for anxiety.   OMEPRAZOLE (PRILOSEC) 20 MG CAPSULE    Take 20 mg by mouth daily.    POLYETHYLENE GLYCOL (MIRALAX / GLYCOLAX) PACKET    Take 17 g by mouth daily.  Modified Medications   No medications on file  Discontinued Medications   No medications on file     Physical Exam: Physical Exam  Constitutional: She appears well-developed and well-nourished.  HENT:  Head: Normocephalic and atraumatic.  Eyes: Conjunctivae and EOM are normal. Pupils are equal, round, and reactive to light. Right eye exhibits no discharge. Left eye exhibits no discharge. No scleral icterus.  Neck: Normal range of motion. Neck supple. No JVD present. No thyromegaly present.  Cardiovascular: Normal rate, regular rhythm and normal heart sounds.   No murmur heard. Pulmonary/Chest: Effort normal. She has no wheezes. She has rales (bibasilar dry rales appreciated).  Abdominal: Soft. Bowel sounds are normal. There is no tenderness.  Musculoskeletal: Normal range of motion. She exhibits no edema or tenderness.  Neurological: She is alert. No cranial nerve deficit. She exhibits normal muscle tone. Coordination normal.  Skin: Skin is warm and dry. No rash noted.     Psychiatric: Her mood appears anxious. Her affect is labile. Her affect is not angry. Her speech is tangential. Her speech is not slurred. She is agitated (when assisted with peronal care) and combative (when she doesn't understad what you want her to do). She is not aggressive, not hyperactive and not withdrawn. Thought content is not paranoid and not delusional. Cognition and memory are impaired. She expresses impulsivity and inappropriate judgment. She does not exhibit a depressed mood. She exhibits abnormal recent memory and abnormal remote  memory.    Filed Vitals:   12/12/14 1618  BP: 110/60  Pulse: 76  Temp: 97.3 F (36.3 C)  TempSrc: Tympanic  Resp: 20      Labs reviewed: Basic Metabolic Panel:  Recent Labs  03/20/14  NA 137  K 4.4  BUN 26*  CREATININE 0.8  TSH 0.74   Liver Function Tests:  Recent Labs  03/20/14  AST 12*  ALT 8  ALKPHOS 101   CBC:  Recent Labs  03/20/14  WBC 6.2  HGB 11.8*  HCT 34*  PLT 288   Assessment/Plan Osteoporosis Takes Vit D 3 50,000u monthly.    Anxiety state Manageable with Celexa 5mg  and Ativan 0.5mg  qam/0.25mg  bid prn.      Essential hypertension Controlled and continue with Lisinopril 10mg     Pruritus Stable itching and seasonal allergy symptoms. Continue Claritin 10mg  daily for now.   Alzheimer's disease End stage. Unable to voice her own needs. Total dependent of her ADLs. She is not aware of her surroundings.     Constipation Stable on MiraLax daily    GERD (gastroesophageal reflux disease) Stable on Omeprazole 20mg  daily.   Osteoarthritis Tylenol 650mg  bid is adequate. Prn Tylenol available to her.      Family/ Staff Communication: observe the patient  Goals of Care: SNF  Labs/tests ordered: none

## 2014-12-12 NOTE — Assessment & Plan Note (Signed)
End stage. Unable to voice her own needs. Total dependent of her ADLs. She is not aware of her surroundings.   

## 2014-12-12 NOTE — Assessment & Plan Note (Signed)
Stable itching and seasonal allergy symptoms. Continue Claritin 10mg  daily for now.

## 2015-01-14 ENCOUNTER — Non-Acute Institutional Stay (SKILLED_NURSING_FACILITY): Payer: Medicare Other | Admitting: Nurse Practitioner

## 2015-01-14 DIAGNOSIS — F0280 Dementia in other diseases classified elsewhere without behavioral disturbance: Secondary | ICD-10-CM

## 2015-01-14 DIAGNOSIS — K219 Gastro-esophageal reflux disease without esophagitis: Secondary | ICD-10-CM

## 2015-01-14 DIAGNOSIS — M159 Polyosteoarthritis, unspecified: Secondary | ICD-10-CM

## 2015-01-14 DIAGNOSIS — M150 Primary generalized (osteo)arthritis: Secondary | ICD-10-CM

## 2015-01-14 DIAGNOSIS — F411 Generalized anxiety disorder: Secondary | ICD-10-CM

## 2015-01-14 DIAGNOSIS — K5900 Constipation, unspecified: Secondary | ICD-10-CM

## 2015-01-14 DIAGNOSIS — G309 Alzheimer's disease, unspecified: Secondary | ICD-10-CM

## 2015-01-14 DIAGNOSIS — I10 Essential (primary) hypertension: Secondary | ICD-10-CM

## 2015-01-14 DIAGNOSIS — L299 Pruritus, unspecified: Secondary | ICD-10-CM

## 2015-01-14 NOTE — Assessment & Plan Note (Signed)
Stable,  takes MiraLax daily.  

## 2015-01-14 NOTE — Assessment & Plan Note (Signed)
Manageable with Celexa 5mg  and Ativan 0.5mg  qam/0.25mg  bid prn.

## 2015-01-14 NOTE — Assessment & Plan Note (Signed)
Stable, dry skin itching and seasonal allergy symptoms. Continue Claritin 10mg  daily for now.

## 2015-01-14 NOTE — Assessment & Plan Note (Signed)
Controlled and continue with Lisinopril 10mg 

## 2015-01-14 NOTE — Assessment & Plan Note (Signed)
Stable on Omeprazole 20 mg daily.

## 2015-01-14 NOTE — Assessment & Plan Note (Signed)
End stage. Unable to voice her own needs. Total dependent of her ADLs. She is not aware of her surroundings.

## 2015-01-14 NOTE — Progress Notes (Signed)
Patient ID: [name], female   DOB: [birth year], 81 y.o.   MRN: [id number]   Code Status: DNR  Allergies  Allergen Reactions  . Morphine And Related     Per MAR  . Oxytrol [Oxybutynin]     Per [facility]    Chief Complaint  Patient presents with  . Medical Management of Chronic Issues    HPI: Patient is a 81 y.o. female seen in the SNF at [facility] today for evaluation of chronic medical conditions.  Problem List Items Addressed This Visit    Pruritus    Stable, dry skin itching and seasonal allergy symptoms. Continue Claritin 10mg  daily for now.        Osteoarthritis    Tylenol 650mg  bid is adequate. Prn Tylenol available to her.         GERD (gastroesophageal reflux disease)    Stable on Omeprazole 20mg  daily.        Essential hypertension    Controlled and continue with Lisinopril 10mg        Constipation    Stable, takes MiraLax daily         Anxiety state    Manageable with Celexa 5mg  and Ativan 0.5mg  qam/0.25mg  bid prn.        Alzheimer's disease - Primary    End stage. Unable to voice her own needs. Total dependent of her ADLs. She is not aware of her surroundings.           Review of Systems:  Review of Systems  Constitutional: Negative for fever, chills, weight loss, malaise/fatigue and diaphoresis.  HENT: Positive for hearing loss. Negative for congestion, ear discharge, ear pain, nosebleeds, sore throat and tinnitus.   Eyes: Negative for blurred vision, double vision, photophobia, pain, discharge and redness.  Respiratory: Negative for cough, hemoptysis, sputum production, shortness of breath, wheezing and stridor.   Cardiovascular: Negative for chest pain, palpitations, orthopnea, claudication, leg swelling and PND.  Gastrointestinal: Negative for heartburn, nausea, vomiting, abdominal pain, diarrhea, constipation and blood in stool.  Genitourinary: Positive for frequency. Negative for dysuria, urgency, hematuria and flank pain.       Incontinent of B+B  Musculoskeletal: Positive for joint pain. Negative for myalgias, back pain, falls and neck pain.  Skin: Positive for itching. Negative for rash.       Chronic, dry skin, on and off   Neurological: Negative for dizziness, tingling, tremors, sensory change, speech change, focal weakness, seizures, loss of consciousness, weakness and headaches.  Endo/Heme/Allergies: Negative for environmental allergies and polydipsia. Does not bruise/bleed easily.  Psychiatric/Behavioral: Positive for depression and memory loss. Negative for suicidal ideas, hallucinations and substance abuse. The patient is nervous/anxious. The patient does not have insomnia.      Past Medical History  Diagnosis Date  . Malignant neoplasm of colon, unspecified site   . Anxiety state, unspecified   . Alzheimer's disease   . Unspecified essential hypertension   . Chronic airway obstruction, not elsewhere classified   . Barrett's esophagus   . Osteoarthrosis, unspecified whether generalized or localized, unspecified site   . Pain in joint, shoulder region   . Osteoporosis, unspecified   . Shortness of breath   . Flatulence, eructation, and gas pain   . Urinary frequency   . Hyperglycemia   . Pressure ulcer, heel(707.07)   . Other specified disease of white blood cells 02/24/2013  . Acute conjunctivitis, unspecified 02/24/2013  . Pneumonia, organism unspecified 02/24/2013  . Hypotension, unspecified 02/21/2013  . Acute bronchitis  02/21/2013  . Hemorrhage of gastrointestinal tract, unspecified 02/21/2013  . Altered mental status 02/21/2013  . Contusion of face, scalp, and neck except eye(s) 06/13/2012  . Unspecified fall 06/13/2012  . Hyposmolality and/or hyponatremia 05/17/2012  . Joint pain of lower extremity 04/15/2012  . Acute upper respiratory infections of unspecified site 04/24/2011  . Hordeolum externum 03/31/2011  . Contact dermatitis and other eczema, due to unspecified cause  09/26/2010  . Candidiasis of other urogenital sites 07/15/2010  . Rash and other nonspecific skin eruption 07/15/2010  . Unspecified vitamin D deficiency 05/07/2010  . Altered mental status 04/11/2010  . Closed fracture of distal end of ulna (alone) 04/12  . Other specified disease of nail 08/16/2009  . Disorders of bursae and tendons in shoulder region, unspecified 06/21/2009  . Pain in joint, pelvic region and thigh 04/12/2009  . Anemia, unspecified 04/05/2009  . Hip joint replacement by other means 03/23/2009  . Unspecified constipation 10/09/2008  . Other malaise and fatigue 11/09/2006  . Aortic aneurysm of unspecified site without mention of rupture 07/28/2005  . Osteoarthrosis, unspecified whether generalized or localized, unspecified site 07/28/2005  . Pain in joint, shoulder region 07/28/2005  . Osteoporosis, unspecified 07/28/2005  . Other emphysema 07/29/1999  . Raynaud's syndrome 07/28/1998   Past Surgical History  Procedure Laterality Date  . Dilation and curettage of uterus    . Colon surgery  2001    partial colon resecton for cancer  . Cataract extraction w/ intraocular lens  implant, bilateral  2004  . Total hip arthroplasty Left 2010   Social History:   reports that she has never smoked. She does not have any smokeless tobacco history on file. She reports that she does not drink alcohol. Her drug history is not on file.  Family History  Problem Relation Age of Onset  . Stroke Mother     Medications: Patient's Medications  New Prescriptions   No medications on file  Previous Medications   ACETAMINOPHEN (TYLENOL) 325 MG TABLET    Take 650 mg by mouth 2 (two) times daily. *Also takes 650mg  every 6 hours as needed*   CALCIUM-VITAMIN D-VITAMIN K (VIACTIV) 481-856-31 MG-UNT-MCG CHEW    Chew 1 tablet by mouth daily.   CITALOPRAM (CELEXA) 10 MG TABLET    Take 5 mg by mouth daily. Take /12 daily to help nerves and depression.   ERGOCALCIFEROL (VITAMIN D2) 50000  UNITS CAPSULE    Take 50,000 Units by mouth every 30 (thirty) days. *Takes on the 17th of each month*   LACTOSE FREE NUTRITION (BOOST) LIQD    Take 237 mLs by mouth daily.   LISINOPRIL (PRINIVIL,ZESTRIL) 10 MG TABLET    Take 10 mg by mouth daily.   LORATADINE (CLARITIN) 10 MG TABLET    Take 10 mg by mouth daily.   LORAZEPAM (ATIVAN) 0.5 MG TABLET    Take 0.25 mg by mouth every morning. 0.25mg  bid prn for anxiety.   OMEPRAZOLE (PRILOSEC) 20 MG CAPSULE    Take 20 mg by mouth daily.    POLYETHYLENE GLYCOL (MIRALAX / GLYCOLAX) PACKET    Take 17 g by mouth daily.  Modified Medications   No medications on file  Discontinued Medications   No medications on file     Physical Exam: Physical Exam  Constitutional: She appears well-developed and well-nourished.  HENT:  Head: Normocephalic and atraumatic.  Eyes: Conjunctivae and EOM are normal. Pupils are equal, round, and reactive to light. Right eye exhibits no discharge. Left eye exhibits  no discharge. No scleral icterus.  Neck: Normal range of motion. Neck supple. No JVD present. No thyromegaly present.  Cardiovascular: Normal rate, regular rhythm and normal heart sounds.   No murmur heard. Pulmonary/Chest: Effort normal. She has no wheezes. She has rales (bibasilar dry rales appreciated).  Abdominal: Soft. Bowel sounds are normal. There is no tenderness.  Musculoskeletal: Normal range of motion. She exhibits no edema or tenderness.  Neurological: She is alert. No cranial nerve deficit. She exhibits normal muscle tone. Coordination normal.  Skin: Skin is warm and dry. No rash noted.  On and off dry skin itching   Psychiatric: Her mood appears anxious. Her affect is labile. Her affect is not angry. Her speech is tangential. Her speech is not slurred. She is agitated (when assisted with peronal care) and combative (when she doesn't understad what you want her to do). She is not aggressive, not hyperactive and not withdrawn. Thought content is not  paranoid and not delusional. Cognition and memory are impaired. She expresses impulsivity and inappropriate judgment. She does not exhibit a depressed mood. She exhibits abnormal recent memory and abnormal remote memory.    Filed Vitals:   01/14/15 1231  BP: 112/80  Pulse: 66  Temp: 97.8 F (36.6 C)  TempSrc: Tympanic  Resp: 18      Labs reviewed: Basic Metabolic Panel:  Recent Labs  03/20/14  NA 137  K 4.4  BUN 26*  CREATININE 0.8  TSH 0.74   Liver Function Tests:  Recent Labs  03/20/14  AST 12*  ALT 8  ALKPHOS 101   CBC:  Recent Labs  03/20/14  WBC 6.2  HGB 11.8*  HCT 34*  PLT 288   Assessment/Plan Alzheimer's disease End stage. Unable to voice her own needs. Total dependent of her ADLs. She is not aware of her surroundings.     Anxiety state Manageable with Celexa 5mg  and Ativan 0.5mg  qam/0.25mg  bid prn.     Constipation Stable, takes MiraLax daily      Essential hypertension Controlled and continue with Lisinopril 10mg     GERD (gastroesophageal reflux disease) Stable on Omeprazole 20mg  daily.     Osteoarthritis Tylenol 650mg  bid is adequate. Prn Tylenol available to her.      Pruritus Stable, dry skin itching and seasonal allergy symptoms. Continue Claritin 10mg  daily for now.       Family/ Staff Communication: observe the patient  Goals of Care: SNF  Labs/tests ordered: none

## 2015-01-14 NOTE — Assessment & Plan Note (Signed)
Tylenol 650mg  bid is adequate. Prn Tylenol available to her.

## 2015-02-13 ENCOUNTER — Encounter: Payer: Self-pay | Admitting: Nurse Practitioner

## 2015-02-13 ENCOUNTER — Non-Acute Institutional Stay (SKILLED_NURSING_FACILITY): Payer: Medicare Other | Admitting: Nurse Practitioner

## 2015-02-13 DIAGNOSIS — F411 Generalized anxiety disorder: Secondary | ICD-10-CM | POA: Diagnosis not present

## 2015-02-13 DIAGNOSIS — G309 Alzheimer's disease, unspecified: Secondary | ICD-10-CM | POA: Diagnosis not present

## 2015-02-13 DIAGNOSIS — K5900 Constipation, unspecified: Secondary | ICD-10-CM | POA: Diagnosis not present

## 2015-02-13 DIAGNOSIS — L299 Pruritus, unspecified: Secondary | ICD-10-CM

## 2015-02-13 DIAGNOSIS — M150 Primary generalized (osteo)arthritis: Secondary | ICD-10-CM

## 2015-02-13 DIAGNOSIS — K219 Gastro-esophageal reflux disease without esophagitis: Secondary | ICD-10-CM | POA: Diagnosis not present

## 2015-02-13 DIAGNOSIS — I10 Essential (primary) hypertension: Secondary | ICD-10-CM

## 2015-02-13 DIAGNOSIS — M159 Polyosteoarthritis, unspecified: Secondary | ICD-10-CM

## 2015-02-13 DIAGNOSIS — F0280 Dementia in other diseases classified elsewhere without behavioral disturbance: Secondary | ICD-10-CM

## 2015-02-13 NOTE — Assessment & Plan Note (Signed)
Stable,  takes MiraLax daily.  

## 2015-02-13 NOTE — Assessment & Plan Note (Signed)
End stage. Unable to voice her own needs. Total dependent of her ADLs. She is not aware of her surroundings.

## 2015-02-13 NOTE — Assessment & Plan Note (Signed)
Tylenol 650mg  bid is adequate. Prn Tylenol available to her.

## 2015-02-13 NOTE — Assessment & Plan Note (Signed)
Manageable with Celexa 5mg  and Ativan 0.5mg  qam/0.25mg  bid prn.

## 2015-02-13 NOTE — Assessment & Plan Note (Signed)
Controlled and continue with Lisinopril 10mg 

## 2015-02-13 NOTE — Assessment & Plan Note (Signed)
Stable, dry skin itching and seasonal allergy symptoms. Continue Claritin 10mg  daily for now.

## 2015-02-13 NOTE — Progress Notes (Signed)
Patient ID: [name], female   DOB: July 17, 1917, 81 y.o.   MRN: [id number]   Code Status: DNR  Allergies  Allergen Reactions  . Morphine And Related     Per MAR  . Oxytrol [Oxybutynin]     Per [facility]    Chief Complaint  Patient presents with  . Medical Management of Chronic Issues    HPI: Patient is a 81 y.o. female seen in the SNF at [facility] today for evaluation of chronic medical conditions.  Problem List Items Addressed This Visit    Pruritus - Primary    Stable, dry skin itching and seasonal allergy symptoms. Continue Claritin 10mg  daily for now.         Osteoarthritis    Tylenol 650mg  bid is adequate. Prn Tylenol available to her.         GERD (gastroesophageal reflux disease)    Stable on Omeprazole 20mg  daily.         Essential hypertension    Controlled and continue with Lisinopril 10mg        Constipation    Stable, takes MiraLax daily        Anxiety state    Manageable with Celexa 5mg  and Ativan 0.5mg  qam/0.25mg  bid prn.        Alzheimer's disease    End stage. Unable to voice her own needs. Total dependent of her ADLs. She is not aware of her surroundings.            Review of Systems:  Review of Systems  Constitutional: Negative for fever, chills, weight loss, malaise/fatigue and diaphoresis.  HENT: Positive for hearing loss. Negative for congestion, ear discharge, ear pain, nosebleeds, sore throat and tinnitus.   Eyes: Negative for blurred vision, double vision, photophobia, pain, discharge and redness.  Respiratory: Negative for cough, hemoptysis, sputum production, shortness of breath, wheezing and stridor.   Cardiovascular: Negative for chest pain, palpitations, orthopnea, claudication, leg swelling and PND.  Gastrointestinal: Negative for heartburn, nausea, vomiting, abdominal pain, diarrhea, constipation and blood in stool.  Genitourinary: Positive for frequency. Negative for dysuria, urgency, hematuria and flank  pain.       Incontinent of bladder  Musculoskeletal: Positive for back pain. Negative for myalgias, joint pain, falls and neck pain.  Skin: Negative for itching and rash.       Dry skin .  Neurological: Negative for dizziness, tingling, tremors, sensory change, speech change, focal weakness, seizures, loss of consciousness, weakness and headaches.  Endo/Heme/Allergies: Negative for environmental allergies and polydipsia. Does not bruise/bleed easily.  Psychiatric/Behavioral: Positive for memory loss. Negative for depression, suicidal ideas, hallucinations and substance abuse. The patient is nervous/anxious. The patient does not have insomnia.      Past Medical History  Diagnosis Date  . Malignant neoplasm of colon, unspecified site   . Anxiety state, unspecified   . Alzheimer's disease   . Unspecified essential hypertension   . Chronic airway obstruction, not elsewhere classified   . Barrett's esophagus   . Osteoarthrosis, unspecified whether generalized or localized, unspecified site   . Pain in joint, shoulder region   . Osteoporosis, unspecified   . Shortness of breath   . Flatulence, eructation, and gas pain   . Urinary frequency   . Hyperglycemia   . Pressure ulcer, heel(707.07)   . Other specified disease of white blood cells 02/24/2013  . Acute conjunctivitis, unspecified 02/24/2013  . Pneumonia, organism unspecified 02/24/2013  . Hypotension, unspecified 02/21/2013  . Acute bronchitis 02/21/2013  .  Hemorrhage of gastrointestinal tract, unspecified 02/21/2013  . Altered mental status 02/21/2013  . Contusion of face, scalp, and neck except eye(s) 06/13/2012  . Unspecified fall 06/13/2012  . Hyposmolality and/or hyponatremia 05/17/2012  . Joint pain of lower extremity 04/15/2012  . Acute upper respiratory infections of unspecified site 04/24/2011  . Hordeolum externum 03/31/2011  . Contact dermatitis and other eczema, due to unspecified cause 09/26/2010  . Candidiasis of  other urogenital sites 07/15/2010  . Rash and other nonspecific skin eruption 07/15/2010  . Unspecified vitamin D deficiency 05/07/2010  . Altered mental status 04/11/2010  . Closed fracture of distal end of ulna (alone) 04/12  . Other specified disease of nail 08/16/2009  . Disorders of bursae and tendons in shoulder region, unspecified 06/21/2009  . Pain in joint, pelvic region and thigh 04/12/2009  . Anemia, unspecified 04/05/2009  . Hip joint replacement by other means 03/23/2009  . Unspecified constipation 10/09/2008  . Other malaise and fatigue 11/09/2006  . Aortic aneurysm of unspecified site without mention of rupture 07/28/2005  . Osteoarthrosis, unspecified whether generalized or localized, unspecified site 07/28/2005  . Pain in joint, shoulder region 07/28/2005  . Osteoporosis, unspecified 07/28/2005  . Other emphysema 07/29/1999  . Raynaud's syndrome 07/28/1998   Past Surgical History  Procedure Laterality Date  . Dilation and curettage of uterus    . Colon surgery  2001    partial colon resecton for cancer  . Cataract extraction w/ intraocular lens  implant, bilateral  2004  . Total hip arthroplasty Left 2010   Social History:   reports that she has never smoked. She does not have any smokeless tobacco history on file. She reports that she does not drink alcohol. Her drug history is not on file.  Family History  Problem Relation Age of Onset  . Stroke Mother     Medications: Patient's Medications  New Prescriptions   No medications on file  Previous Medications   ACETAMINOPHEN (TYLENOL) 325 MG TABLET    Take 650 mg by mouth 2 (two) times daily. *Also takes 650mg  every 6 hours as needed*   CALCIUM-VITAMIN D-VITAMIN K (VIACTIV) 010-272-53 MG-UNT-MCG CHEW    Chew 1 tablet by mouth daily.   CITALOPRAM (CELEXA) 10 MG TABLET    Take 5 mg by mouth daily. Take /12 daily to help nerves and depression.   ERGOCALCIFEROL (VITAMIN D2) 50000 UNITS CAPSULE    Take 50,000  Units by mouth every 30 (thirty) days. *Takes on the 17th of each month*   LACTOSE FREE NUTRITION (BOOST) LIQD    Take 237 mLs by mouth daily.   LISINOPRIL (PRINIVIL,ZESTRIL) 10 MG TABLET    Take 10 mg by mouth daily.   LORATADINE (CLARITIN) 10 MG TABLET    Take 10 mg by mouth daily.   LORAZEPAM (ATIVAN) 0.5 MG TABLET    Take 0.25 mg by mouth every morning. 0.25mg  bid prn for anxiety.   OMEPRAZOLE (PRILOSEC) 20 MG CAPSULE    Take 20 mg by mouth daily.    POLYETHYLENE GLYCOL (MIRALAX / GLYCOLAX) PACKET    Take 17 g by mouth daily.  Modified Medications   No medications on file  Discontinued Medications   No medications on file     Physical Exam: Physical Exam  Constitutional: She appears well-developed and well-nourished.  HENT:  Head: Normocephalic and atraumatic.  Eyes: Conjunctivae and EOM are normal. Pupils are equal, round, and reactive to light. Right eye exhibits no discharge. Left eye exhibits no discharge. No  scleral icterus.  Neck: Normal range of motion. Neck supple. No JVD present. No thyromegaly present.  Cardiovascular: Normal rate, regular rhythm and normal heart sounds.   No murmur heard. Pulmonary/Chest: Effort normal. She has no wheezes. She has rales (bibasilar dry rales appreciated).  Abdominal: Soft. Bowel sounds are normal. There is no tenderness.  Musculoskeletal: Normal range of motion. She exhibits no edema or tenderness.  Neurological: She is alert. No cranial nerve deficit. She exhibits normal muscle tone. Coordination normal.  Skin: Skin is warm and dry. No rash noted.  On and off dry skin itching   Psychiatric: Her mood appears anxious. Her affect is labile. Her affect is not angry. Her speech is tangential. Her speech is not slurred. She is agitated (when assisted with peronal care) and combative (when she doesn't understad what you want her to do). She is not aggressive, not hyperactive and not withdrawn. Thought content is not paranoid and not delusional.  Cognition and memory are impaired. She expresses impulsivity and inappropriate judgment. She does not exhibit a depressed mood. She exhibits abnormal recent memory and abnormal remote memory.    Filed Vitals:   02/13/15 1246  BP: 110/70  Pulse: 74  Temp: 98.2 F (36.8 C)  TempSrc: Tympanic  Resp: 16      Labs reviewed: Basic Metabolic Panel:  Recent Labs  03/20/14  NA 137  K 4.4  BUN 26*  CREATININE 0.8  TSH 0.74   Liver Function Tests:  Recent Labs  03/20/14  AST 12*  ALT 8  ALKPHOS 101   CBC:  Recent Labs  03/20/14  WBC 6.2  HGB 11.8*  HCT 34*  PLT 288   Assessment/Plan Pruritus Stable, dry skin itching and seasonal allergy symptoms. Continue Claritin 10mg  daily for now.      Osteoarthritis Tylenol 650mg  bid is adequate. Prn Tylenol available to her.      GERD (gastroesophageal reflux disease) Stable on Omeprazole 20mg  daily.      Essential hypertension Controlled and continue with Lisinopril 10mg     Constipation Stable, takes MiraLax daily     Anxiety state Manageable with Celexa 5mg  and Ativan 0.5mg  qam/0.25mg  bid prn.     Alzheimer's disease End stage. Unable to voice her own needs. Total dependent of her ADLs. She is not aware of her surroundings.        Family/ Staff Communication: observe the patient  Goals of Care: SNF  Labs/tests ordered: none

## 2015-02-13 NOTE — Assessment & Plan Note (Signed)
Stable on Omeprazole 20 mg daily.

## 2015-03-13 ENCOUNTER — Non-Acute Institutional Stay (SKILLED_NURSING_FACILITY): Payer: Medicare Other | Admitting: Nurse Practitioner

## 2015-03-13 DIAGNOSIS — F0391 Unspecified dementia with behavioral disturbance: Secondary | ICD-10-CM

## 2015-03-13 DIAGNOSIS — L299 Pruritus, unspecified: Secondary | ICD-10-CM | POA: Diagnosis not present

## 2015-03-13 DIAGNOSIS — I10 Essential (primary) hypertension: Secondary | ICD-10-CM

## 2015-03-13 DIAGNOSIS — K5900 Constipation, unspecified: Secondary | ICD-10-CM | POA: Diagnosis not present

## 2015-03-13 DIAGNOSIS — F0280 Dementia in other diseases classified elsewhere without behavioral disturbance: Secondary | ICD-10-CM

## 2015-03-13 DIAGNOSIS — F411 Generalized anxiety disorder: Secondary | ICD-10-CM | POA: Diagnosis not present

## 2015-03-13 DIAGNOSIS — K219 Gastro-esophageal reflux disease without esophagitis: Secondary | ICD-10-CM

## 2015-03-13 DIAGNOSIS — M150 Primary generalized (osteo)arthritis: Secondary | ICD-10-CM

## 2015-03-13 DIAGNOSIS — M159 Polyosteoarthritis, unspecified: Secondary | ICD-10-CM

## 2015-03-13 DIAGNOSIS — G309 Alzheimer's disease, unspecified: Secondary | ICD-10-CM

## 2015-03-13 NOTE — Assessment & Plan Note (Signed)
Tylenol 650mg  bid is adequate. Prn Tylenol available to her.

## 2015-03-13 NOTE — Assessment & Plan Note (Signed)
End stage-total care for her ADLs

## 2015-03-13 NOTE — Assessment & Plan Note (Signed)
Controlled and continue with Lisinopril 10mg 

## 2015-03-13 NOTE — Assessment & Plan Note (Signed)
Stable on Omeprazole 20 mg daily.

## 2015-03-13 NOTE — Assessment & Plan Note (Signed)
Manageable with Celexa 5mg  and Ativan 0.5mg  qam/0.25mg  bid prn.

## 2015-03-13 NOTE — Assessment & Plan Note (Signed)
Stable,  takes MiraLax daily.  

## 2015-03-13 NOTE — Assessment & Plan Note (Signed)
Stable, dry skin itching and seasonal allergy symptoms. Continue Claritin 10mg  daily for now.

## 2015-03-13 NOTE — Assessment & Plan Note (Signed)
End-stage

## 2015-03-13 NOTE — Progress Notes (Signed)
Patient ID: [name], female   DOB: 24-Jun-1917, 81 y.o.   MRN: [id number]   Code Status: DNR  Allergies  Allergen Reactions  . Morphine And Related     Per MAR  . Oxytrol [Oxybutynin]     Per MAR    No chief complaint on file.   HPI: Patient is a 81 y.o. female seen in the SNF at [facility] today for evaluation of chronic medical conditions.  Problem List Items Addressed This Visit    Anxiety state - Primary    Manageable with Celexa 5mg  and Ativan 0.5mg  qam/0.25mg  bid prn.       Dementia    End stage-total care for her ADLs      Constipation    Stable, takes MiraLax daily       GERD (gastroesophageal reflux disease)    Stable on Omeprazole 20mg  daily.        Alzheimer's disease    End stage      Essential hypertension    Controlled and continue with Lisinopril 10mg        Osteoarthritis    Tylenol 650mg  bid is adequate. Prn Tylenol available to her.         Pruritus    Stable, dry skin itching and seasonal allergy symptoms. Continue Claritin 10mg  daily for now.            Review of Systems:  Review of Systems  Constitutional: Negative for fever, chills and diaphoresis.  HENT: Positive for hearing loss. Negative for congestion, ear discharge, ear pain, nosebleeds, sore throat and tinnitus.   Eyes: Negative for photophobia, pain, discharge and redness.  Respiratory: Negative for cough, shortness of breath, wheezing and stridor.   Cardiovascular: Negative for chest pain, palpitations and leg swelling.  Gastrointestinal: Negative for nausea, vomiting, abdominal pain, diarrhea, constipation and blood in stool.  Endocrine: Negative for polydipsia.  Genitourinary: Positive for frequency. Negative for dysuria, urgency, hematuria and flank pain.       Incontinent of bladder  Musculoskeletal: Positive for back pain. Negative for myalgias and neck pain.  Skin: Negative for rash.       Dry skin .  Allergic/Immunologic: Negative for  environmental allergies.  Neurological: Negative for dizziness, tremors, seizures, weakness and headaches.  Hematological: Does not bruise/bleed easily.  Psychiatric/Behavioral: Negative for suicidal ideas and hallucinations. The patient is nervous/anxious.      Past Medical History  Diagnosis Date  . Malignant neoplasm of colon, unspecified site   . Anxiety state, unspecified   . Alzheimer's disease   . Unspecified essential hypertension   . Chronic airway obstruction, not elsewhere classified   . Barrett's esophagus   . Osteoarthrosis, unspecified whether generalized or localized, unspecified site   . Pain in joint, shoulder region   . Osteoporosis, unspecified   . Shortness of breath   . Flatulence, eructation, and gas pain   . Urinary frequency   . Hyperglycemia   . Pressure ulcer, heel(707.07)   . Other specified disease of white blood cells 02/24/2013  . Acute conjunctivitis, unspecified 02/24/2013  . Pneumonia, organism unspecified 02/24/2013  . Hypotension, unspecified 02/21/2013  . Acute bronchitis 02/21/2013  . Hemorrhage of gastrointestinal tract, unspecified 02/21/2013  . Altered mental status 02/21/2013  . Contusion of face, scalp, and neck except eye(s) 06/13/2012  . Unspecified fall 06/13/2012  . Hyposmolality and/or hyponatremia 05/17/2012  . Joint pain of lower extremity 04/15/2012  . Acute upper respiratory infections of unspecified site 04/24/2011  . Hordeolum  externum 03/31/2011  . Contact dermatitis and other eczema, due to unspecified cause 09/26/2010  . Candidiasis of other urogenital sites 07/15/2010  . Rash and other nonspecific skin eruption 07/15/2010  . Unspecified vitamin D deficiency 05/07/2010  . Altered mental status 04/11/2010  . Closed fracture of distal end of ulna (alone) 04/12  . Other specified disease of nail 08/16/2009  . Disorders of bursae and tendons in shoulder region, unspecified 06/21/2009  . Pain in joint, pelvic region and  thigh 04/12/2009  . Anemia, unspecified 04/05/2009  . Hip joint replacement by other means 03/23/2009  . Unspecified constipation 10/09/2008  . Other malaise and fatigue 11/09/2006  . Aortic aneurysm of unspecified site without mention of rupture 07/28/2005  . Osteoarthrosis, unspecified whether generalized or localized, unspecified site 07/28/2005  . Pain in joint, shoulder region 07/28/2005  . Osteoporosis, unspecified 07/28/2005  . Other emphysema 07/29/1999  . Raynaud's syndrome 07/28/1998   Past Surgical History  Procedure Laterality Date  . Dilation and curettage of uterus    . Colon surgery  2001    partial colon resecton for cancer  . Cataract extraction w/ intraocular lens  implant, bilateral  2004  . Total hip arthroplasty Left 2010   Social History:   reports that she has never smoked. She does not have any smokeless tobacco history on file. She reports that she does not drink alcohol. Her drug history is not on file.  Family History  Problem Relation Age of Onset  . Stroke Mother     Medications: Patient's Medications  New Prescriptions   No medications on file  Previous Medications   ACETAMINOPHEN (TYLENOL) 325 MG TABLET    Take 650 mg by mouth 2 (two) times daily. *Also takes 650mg  every 6 hours as needed*   CALCIUM-VITAMIN D-VITAMIN K (VIACTIV) [id number] MG-UNT-MCG CHEW    Chew 1 tablet by mouth daily.   CITALOPRAM (CELEXA) 10 MG TABLET    Take 5 mg by mouth daily. Take /12 daily to help nerves and depression.   ERGOCALCIFEROL (VITAMIN D2) 50000 UNITS CAPSULE    Take 50,000 Units by mouth every 30 (thirty) days. *Takes on the 17th of each month*   LACTOSE FREE NUTRITION (BOOST) LIQD    Take 237 mLs by mouth daily.   LISINOPRIL (PRINIVIL,ZESTRIL) 10 MG TABLET    Take 10 mg by mouth daily.   LORATADINE (CLARITIN) 10 MG TABLET    Take 10 mg by mouth daily.   LORAZEPAM (ATIVAN) 0.5 MG TABLET    Take 0.25 mg by mouth every morning. 0.25mg  bid prn for anxiety.    OMEPRAZOLE (PRILOSEC) 20 MG CAPSULE    Take 20 mg by mouth daily.    POLYETHYLENE GLYCOL (MIRALAX / GLYCOLAX) PACKET    Take 17 g by mouth daily.  Modified Medications   No medications on file  Discontinued Medications   No medications on file     Physical Exam: Physical Exam  Constitutional: She appears well-developed and well-nourished.  HENT:  Head: Normocephalic and atraumatic.  Eyes: Conjunctivae and EOM are normal. Pupils are equal, round, and reactive to light. Right eye exhibits no discharge. Left eye exhibits no discharge. No scleral icterus.  Neck: Normal range of motion. Neck supple. No JVD present. No thyromegaly present.  Cardiovascular: Normal rate, regular rhythm and normal heart sounds.   No murmur heard. Pulmonary/Chest: Effort normal. She has no wheezes. She has rales (bibasilar dry rales appreciated).  Abdominal: Soft. Bowel sounds are normal. There is no  tenderness.  Musculoskeletal: Normal range of motion. She exhibits no edema or tenderness.  Neurological: She is alert. No cranial nerve deficit. She exhibits normal muscle tone. Coordination normal.  Skin: Skin is warm and dry. No rash noted.  On and off dry skin itching   Psychiatric: Her mood appears anxious. Her affect is labile. Her affect is not angry. Her speech is tangential. Her speech is not slurred. She is agitated (when assisted with peronal care) and combative (when she doesn't understad what you want her to do). She is not aggressive, not hyperactive and not withdrawn. Thought content is not paranoid and not delusional. Cognition and memory are impaired. She expresses impulsivity and inappropriate judgment. She does not exhibit a depressed mood. She exhibits abnormal recent memory and abnormal remote memory.    Filed Vitals:   03/13/15 1549  BP: 110/70  Pulse: 84  Temp: 97.8 F (36.6 C)  TempSrc: Tympanic  Resp: 20      Labs reviewed: Basic Metabolic Panel:  Recent Labs  03/20/14  NA 137    K 4.4  BUN 26*  CREATININE 0.8  TSH 0.74   Liver Function Tests:  Recent Labs  03/20/14  AST 12*  ALT 8  ALKPHOS 101   No results for input(s): LIPASE, AMYLASE in the last 8760 hours. No results for input(s): AMMONIA in the last 8760 hours. CBC:  Recent Labs  03/20/14  WBC 6.2  HGB 11.8*  HCT 34*  PLT 288   Lipid Panel: No results for input(s): CHOL, HDL, LDLCALC, TRIG, CHOLHDL, LDLDIRECT in the last 8760 hours.  Past Procedures:  None recently.   Assessment/Plan Anxiety state Manageable with Celexa 5mg  and Ativan 0.5mg  qam/0.25mg  bid prn.    Dementia End stage-total care for her ADLs   Constipation Stable, takes MiraLax daily    GERD (gastroesophageal reflux disease) Stable on Omeprazole 20mg  daily.     Alzheimer's disease End stage   Essential hypertension Controlled and continue with Lisinopril 10mg     Osteoarthritis Tylenol 650mg  bid is adequate. Prn Tylenol available to her.      Pruritus Stable, dry skin itching and seasonal allergy symptoms. Continue Claritin 10mg  daily for now.        Family/ Staff Communication: observe the patient  Goals of Care: SNF  Labs/tests ordered: none

## 2015-04-10 ENCOUNTER — Non-Acute Institutional Stay (SKILLED_NURSING_FACILITY): Payer: Medicare Other | Admitting: Nurse Practitioner

## 2015-04-10 ENCOUNTER — Encounter: Payer: Self-pay | Admitting: Nurse Practitioner

## 2015-04-10 DIAGNOSIS — M150 Primary generalized (osteo)arthritis: Secondary | ICD-10-CM

## 2015-04-10 DIAGNOSIS — F411 Generalized anxiety disorder: Secondary | ICD-10-CM | POA: Diagnosis not present

## 2015-04-10 DIAGNOSIS — K219 Gastro-esophageal reflux disease without esophagitis: Secondary | ICD-10-CM | POA: Diagnosis not present

## 2015-04-10 DIAGNOSIS — L299 Pruritus, unspecified: Secondary | ICD-10-CM | POA: Diagnosis not present

## 2015-04-10 DIAGNOSIS — K5900 Constipation, unspecified: Secondary | ICD-10-CM | POA: Diagnosis not present

## 2015-04-10 DIAGNOSIS — I10 Essential (primary) hypertension: Secondary | ICD-10-CM | POA: Diagnosis not present

## 2015-04-10 DIAGNOSIS — M159 Polyosteoarthritis, unspecified: Secondary | ICD-10-CM

## 2015-04-10 NOTE — Assessment & Plan Note (Signed)
Stable, dry skin itching and seasonal allergy symptoms. Continue Claritin 10mg  daily for now.

## 2015-04-10 NOTE — Assessment & Plan Note (Signed)
Manageable with Celexa 5mg  and Ativan 0.5mg  qam/0.25mg  bid prn.

## 2015-04-10 NOTE — Assessment & Plan Note (Signed)
Tylenol 650mg  bid is adequate. Prn Tylenol available to her.

## 2015-04-10 NOTE — Progress Notes (Signed)
Patient ID: [name], female   DOB: 03/10/1917, 81 y.o.   MRN: [id number]   Code Status: DNR  Allergies  Allergen Reactions  . Morphine And Related     Per MAR  . Oxytrol [Oxybutynin]     Per [facility]    Chief Complaint  Patient presents with  . Medical Management of Chronic Issues    HPI: Patient is a 81 y.o. female seen in the SNF at [facility] today for evaluation of chronic medical conditions.  Problem List Items Addressed This Visit    Anxiety state - Primary    Manageable with Celexa 5mg  and Ativan 0.5mg  qam/0.25mg  bid prn.        Constipation    Stable, takes MiraLax daily        Essential hypertension    Controlled and continue with Lisinopril 10mg         GERD (gastroesophageal reflux disease)    Stable on Omeprazole 20mg  daily.         Osteoarthritis    Tylenol 650mg  bid is adequate. Prn Tylenol available to her.       Pruritus    Stable, dry skin itching and seasonal allergy symptoms. Continue Claritin 10mg  daily for now.           Review of Systems:  Review of Systems  Constitutional: Negative for fever, chills and diaphoresis.  HENT: Positive for hearing loss. Negative for congestion, ear discharge, ear pain, nosebleeds, sore throat and tinnitus.   Eyes: Negative for photophobia, pain, discharge and redness.  Respiratory: Negative for cough, shortness of breath, wheezing and stridor.   Cardiovascular: Negative for chest pain, palpitations and leg swelling.  Gastrointestinal: Negative for nausea, vomiting, abdominal pain, diarrhea, constipation and blood in stool.  Endocrine: Negative for polydipsia.  Genitourinary: Positive for frequency. Negative for dysuria, urgency, hematuria and flank pain.       Incontinent of bladder  Musculoskeletal: Positive for back pain. Negative for myalgias and neck pain.  Skin: Negative for rash.       Dry skin .  Allergic/Immunologic: Negative for environmental allergies.  Neurological: Negative  for dizziness, tremors, seizures, weakness and headaches.  Hematological: Does not bruise/bleed easily.  Psychiatric/Behavioral: Negative for suicidal ideas and hallucinations. The patient is nervous/anxious.      Past Medical History  Diagnosis Date  . Malignant neoplasm of colon, unspecified site   . Anxiety state, unspecified   . Alzheimer's disease   . Unspecified essential hypertension   . Chronic airway obstruction, not elsewhere classified   . Barrett's esophagus   . Osteoarthrosis, unspecified whether generalized or localized, unspecified site   . Pain in joint, shoulder region   . Osteoporosis, unspecified   . Shortness of breath   . Flatulence, eructation, and gas pain   . Urinary frequency   . Hyperglycemia   . Pressure ulcer, heel(707.07)   . Other specified disease of white blood cells 02/24/2013  . Acute conjunctivitis, unspecified 02/24/2013  . Pneumonia, organism unspecified 02/24/2013  . Hypotension, unspecified 02/21/2013  . Acute bronchitis 02/21/2013  . Hemorrhage of gastrointestinal tract, unspecified 02/21/2013  . Altered mental status 02/21/2013  . Contusion of face, scalp, and neck except eye(s) 06/13/2012  . Unspecified fall 06/13/2012  . Hyposmolality and/or hyponatremia 05/17/2012  . Joint pain of lower extremity 04/15/2012  . Acute upper respiratory infections of unspecified site 04/24/2011  . Hordeolum externum 03/31/2011  . Contact dermatitis and other eczema, due to unspecified cause 09/26/2010  . Candidiasis  of other urogenital sites 07/15/2010  . Rash and other nonspecific skin eruption 07/15/2010  . Unspecified vitamin D deficiency 05/07/2010  . Altered mental status 04/11/2010  . Closed fracture of distal end of ulna (alone) 04/12  . Other specified disease of nail 08/16/2009  . Disorders of bursae and tendons in shoulder region, unspecified 06/21/2009  . Pain in joint, pelvic region and thigh 04/12/2009  . Anemia, unspecified 04/05/2009    . Hip joint replacement by other means 03/23/2009  . Unspecified constipation 10/09/2008  . Other malaise and fatigue 11/09/2006  . Aortic aneurysm of unspecified site without mention of rupture 07/28/2005  . Osteoarthrosis, unspecified whether generalized or localized, unspecified site 07/28/2005  . Pain in joint, shoulder region 07/28/2005  . Osteoporosis, unspecified 07/28/2005  . Other emphysema 07/29/1999  . Raynaud's syndrome 07/28/1998   Past Surgical History  Procedure Laterality Date  . Dilation and curettage of uterus    . Colon surgery  2001    partial colon resecton for cancer  . Cataract extraction w/ intraocular lens  implant, bilateral  2004  . Total hip arthroplasty Left 2010   Social History:   reports that she has never smoked. She does not have any smokeless tobacco history on file. She reports that she does not drink alcohol. Her drug history is not on file.  Family History  Problem Relation Age of Onset  . Stroke Mother     Medications: Patient's Medications  New Prescriptions   No medications on file  Previous Medications   ACETAMINOPHEN (TYLENOL) 325 MG TABLET    Take 650 mg by mouth 2 (two) times daily. *Also takes 650mg  every 6 hours as needed*   CALCIUM-VITAMIN D-VITAMIN K (VIACTIV) [id number] MG-UNT-MCG CHEW    Chew 1 tablet by mouth daily.   CITALOPRAM (CELEXA) 10 MG TABLET    Take 5 mg by mouth daily. Take /12 daily to help nerves and depression.   ERGOCALCIFEROL (VITAMIN D2) 50000 UNITS CAPSULE    Take 50,000 Units by mouth every 30 (thirty) days. *Takes on the 17th of each month*   LACTOSE FREE NUTRITION (BOOST) LIQD    Take 237 mLs by mouth daily.   LISINOPRIL (PRINIVIL,ZESTRIL) 10 MG TABLET    Take 10 mg by mouth daily.   LORATADINE (CLARITIN) 10 MG TABLET    Take 10 mg by mouth daily.   LORAZEPAM (ATIVAN) 0.5 MG TABLET    Take 0.25 mg by mouth every morning. 0.25mg  bid prn for anxiety.   OMEPRAZOLE (PRILOSEC) 20 MG CAPSULE    Take 20 mg by  mouth daily.    POLYETHYLENE GLYCOL (MIRALAX / GLYCOLAX) PACKET    Take 17 g by mouth daily.  Modified Medications   No medications on file  Discontinued Medications   No medications on file     Physical Exam: Physical Exam  Constitutional: She appears well-developed and well-nourished.  HENT:  Head: Normocephalic and atraumatic.  Eyes: Conjunctivae and EOM are normal. Pupils are equal, round, and reactive to light. Right eye exhibits no discharge. Left eye exhibits no discharge. No scleral icterus.  Neck: Normal range of motion. Neck supple. No JVD present. No thyromegaly present.  Cardiovascular: Normal rate, regular rhythm and normal heart sounds.   No murmur heard. Pulmonary/Chest: Effort normal. She has no wheezes. She has rales (bibasilar dry rales appreciated).  Abdominal: Soft. Bowel sounds are normal. There is no tenderness.  Musculoskeletal: Normal range of motion. She exhibits no edema or tenderness.  Neurological: She  is alert. No cranial nerve deficit. She exhibits normal muscle tone. Coordination normal.  Skin: Skin is warm and dry. No rash noted.  On and off dry skin itching   Psychiatric: Her mood appears anxious. Her affect is labile. Her affect is not angry. Her speech is tangential. Her speech is not slurred. She is agitated (when assisted with peronal care) and combative (when she doesn't understad what you want her to do). She is not aggressive, not hyperactive and not withdrawn. Thought content is not paranoid and not delusional. Cognition and memory are impaired. She expresses impulsivity and inappropriate judgment. She does not exhibit a depressed mood. She exhibits abnormal recent memory and abnormal remote memory.    Filed Vitals:   04/10/15 1317  BP: 120/60  Pulse: 72  Temp: 98.2 F (36.8 C)  TempSrc: Tympanic  Resp: 18      Labs reviewed: Basic Metabolic Panel: No results for input(s): NA, K, CL, CO2, GLUCOSE, BUN, CREATININE, CALCIUM, MG, PHOS,  TSH in the last 8760 hours. Liver Function Tests: No results for input(s): AST, ALT, ALKPHOS, BILITOT, PROT, ALBUMIN in the last 8760 hours. No results for input(s): LIPASE, AMYLASE in the last 8760 hours. No results for input(s): AMMONIA in the last 8760 hours. CBC: No results for input(s): WBC, NEUTROABS, HGB, HCT, MCV, PLT in the last 8760 hours. Lipid Panel: No results for input(s): CHOL, HDL, LDLCALC, TRIG, CHOLHDL, LDLDIRECT in the last 8760 hours.  Past Procedures:  None recently.   Assessment/Plan Anxiety state Manageable with Celexa 5mg  and Ativan 0.5mg  qam/0.25mg  bid prn.     Constipation Stable, takes MiraLax daily     Essential hypertension Controlled and continue with Lisinopril 10mg      Pruritus Stable, dry skin itching and seasonal allergy symptoms. Continue Claritin 10mg  daily for now.     GERD (gastroesophageal reflux disease) Stable on Omeprazole 20mg  daily.      Osteoarthritis Tylenol 650mg  bid is adequate. Prn Tylenol available to her.      Family/ Staff Communication: observe the patient  Goals of Care: SNF  Labs/tests ordered: none

## 2015-04-10 NOTE — Assessment & Plan Note (Signed)
Controlled and continue with Lisinopril 10mg 

## 2015-04-10 NOTE — Assessment & Plan Note (Signed)
Stable on Omeprazole 20 mg daily.

## 2015-04-10 NOTE — Assessment & Plan Note (Signed)
Stable,  takes MiraLax daily.  

## 2015-05-02 ENCOUNTER — Non-Acute Institutional Stay (SKILLED_NURSING_FACILITY): Payer: Medicare Other | Admitting: Internal Medicine

## 2015-05-02 ENCOUNTER — Encounter: Payer: Self-pay | Admitting: Internal Medicine

## 2015-05-02 DIAGNOSIS — M25512 Pain in left shoulder: Secondary | ICD-10-CM | POA: Diagnosis not present

## 2015-05-02 DIAGNOSIS — N3000 Acute cystitis without hematuria: Secondary | ICD-10-CM | POA: Diagnosis not present

## 2015-05-02 DIAGNOSIS — G309 Alzheimer's disease, unspecified: Secondary | ICD-10-CM

## 2015-05-02 DIAGNOSIS — F0280 Dementia in other diseases classified elsewhere without behavioral disturbance: Secondary | ICD-10-CM

## 2015-05-02 DIAGNOSIS — I10 Essential (primary) hypertension: Secondary | ICD-10-CM

## 2015-05-02 NOTE — Progress Notes (Signed)
Patient ID: [name], female   DOB: 1917/09/17, 81 y.o.   MRN: [id number]    HISTORY AND PHYSICAL  Location:  [facility] Room Number: 16 Place of Service: SNF (31)   Extended Emergency Contact Information Primary Emergency Contact: SMITH,SALLIE Address: [address] [place]  [place] Home Phone: [phone] Relation: None  Advanced Directive information Does patient have an advance directive?: Yes, Type of Advance Directive: Living will;Out of facility DNR (pink MOST or yellow form), Pre-existing out of facility DNR order (yellow form or pink MOST form): Yellow form placed in chart (order not valid for inpatient use) (At [facility]), Does patient want to make changes to advanced directive?: No - Patient declined  Chief Complaint  Patient presents with  . Annual Exam  . Medical Management of Chronic Issues    HPI:  Alzheimer's disease: End-stage. Chronically confused.  Essential hypertension: Controlled  Pain in joint, shoulder region, left: Some discomfort on attempts to lift the left arm  Acute cystitis without hematuria: Staph species (coagulase-negative) on 03/20/2014. No infections since then.    Past Medical History  Diagnosis Date  . Malignant neoplasm of colon, unspecified site   . Anxiety state, unspecified   . Alzheimer's disease   . Unspecified essential hypertension   . Chronic airway obstruction, not elsewhere classified   . Barrett's esophagus   . Osteoarthrosis, unspecified whether generalized or localized, unspecified site   . Osteoporosis, unspecified   . Shortness of breath   . Flatulence, eructation, and gas pain   . Urinary frequency   . Hyperglycemia   . Pressure ulcer, heel(707.07)   . Pneumonia, organism unspecified 02/24/2013  . Hypotension, unspecified 02/21/2013  . Hemorrhage of gastrointestinal tract, unspecified 02/21/2013  . Altered mental status 02/21/2013  . Unspecified fall  06/13/2012  . Joint pain of lower extremity 04/15/2012  . Unspecified vitamin D deficiency 05/07/2010  . Altered mental status 04/11/2010  . Closed fracture of distal end of ulna (alone) 04/12  . Disorders of bursae and tendons in shoulder region, unspecified 06/21/2009  . Pain in joint, pelvic region and thigh 04/12/2009  . Anemia, unspecified 04/05/2009  . Hip joint replacement by other means 03/23/2009  . Unspecified constipation 10/09/2008  . Aortic aneurysm of unspecified site without mention of rupture 07/28/2005  . Osteoarthrosis, unspecified whether generalized or localized, unspecified site 07/28/2005  . Osteoporosis, unspecified 07/28/2005  . Other emphysema 07/29/1999  . Raynaud's syndrome 07/28/1998    Past Surgical History  Procedure Laterality Date  . Dilation and curettage of uterus    . Colon surgery  2001    partial colon resecton for cancer  . Cataract extraction w/ intraocular lens  implant, bilateral  2004  . Total hip arthroplasty Left 2010    Patient Care Team: Estill Dooms, MD as PCP - General (Internal Medicine) [place] Man Mast X, NP as Nurse Practitioner (Nurse Practitioner)  History   Social History  . Marital Status: Divorced    Spouse Name: N/A  . Number of Children: N/A  . Years of Education: N/A   Occupational History  . Not on file.   Social History Main Topics  . Smoking status: Never Smoker   . Smokeless tobacco: Not on file  . Alcohol Use: No  . Drug Use: Not on file  . Sexual Activity: Not on file   Other Topics Concern  . Not on file  Social History Narrative     reports that she has never smoked. She does not have any smokeless tobacco history on file. She reports that she does not drink alcohol. Her drug history is not on file.  Family History  Problem Relation Age of Onset  . Stroke Mother    Family Status  Relation Status Death Age  . Mother Deceased   . Father Deceased     heat stroke  .  Daughter Alive   . Son Alive   . Son Alive   . Daughter Alive     Immunization History  Administered Date(s) Administered  . Influenza Whole 10/24/2012  . Influenza-Unspecified 10/31/2014  . Pneumococcal Polysaccharide-23 12/29/2003  . Td 12/28/2006  . Tdap 03/13/2014    Allergies  Allergen Reactions  . Morphine And Related     Per MAR  . Oxytrol [Oxybutynin]     Per MAR    Medications: Patient's Medications  New Prescriptions   No medications on file  Previous Medications   ACETAMINOPHEN (TYLENOL) 325 MG TABLET    Take 650 mg by mouth 2 (two) times daily. *Also takes 650mg  every 6 hours as needed*   CALCIUM-VITAMIN D-VITAMIN K (VIACTIV) 401-027-25 MG-UNT-MCG CHEW    Chew 1 tablet by mouth daily.   CITALOPRAM (CELEXA) 10 MG TABLET    Take 5 mg by mouth daily. Take /12 daily to help nerves and depression.   ERGOCALCIFEROL (VITAMIN D2) 50000 UNITS CAPSULE    Take 50,000 Units by mouth every 30 (thirty) days. *Takes on the 17th of each month*   LACTOSE FREE NUTRITION (BOOST) LIQD    Take 237 mLs by mouth daily.   LISINOPRIL (PRINIVIL,ZESTRIL) 10 MG TABLET    Take 10 mg by mouth daily.   LORATADINE (CLARITIN) 10 MG TABLET    Take 10 mg by mouth daily.   LORAZEPAM (ATIVAN) 0.5 MG TABLET    Take 0.25 mg by mouth every morning. 0.25mg  bid prn for anxiety.   OMEPRAZOLE (PRILOSEC) 20 MG CAPSULE    Take 20 mg by mouth daily.    POLYETHYLENE GLYCOL (MIRALAX / GLYCOLAX) PACKET    Take 17 g by mouth daily.  Modified Medications   No medications on file  Discontinued Medications   No medications on file    Review of Systems  Constitutional: Negative for fever, chills and diaphoresis.  HENT: Positive for hearing loss. Negative for congestion, ear discharge, ear pain, nosebleeds, sore throat and tinnitus.   Eyes: Negative for photophobia, pain, discharge and redness.  Respiratory: Negative for cough, shortness of breath, wheezing and stridor.   Cardiovascular: Negative for chest  pain, palpitations and leg swelling.  Gastrointestinal: Negative for nausea, vomiting, abdominal pain, diarrhea, constipation and blood in stool.  Endocrine: Negative for polydipsia.  Genitourinary: Positive for frequency. Negative for dysuria, urgency, hematuria and flank pain.       Incontinent of bladder  Musculoskeletal: Positive for back pain. Negative for myalgias and neck pain.  Skin: Negative for rash.       Dry skin .  Allergic/Immunologic: Negative for environmental allergies.  Neurological: Negative for dizziness, tremors, seizures, weakness and headaches.  Hematological: Does not bruise/bleed easily.  Psychiatric/Behavioral: Negative for suicidal ideas and hallucinations. The patient is nervous/anxious.     Filed Vitals:   05/02/15 1257  BP: 110/70  Pulse: 84  Temp: 97.8 F (36.6 C)  Resp: 20  Height: 5\' 3"  (1.6 m)  Weight: 134 lb 6.4 oz (60.963 kg)   Body  mass index is 23.81 kg/(m^2).  Physical Exam  Constitutional: She appears well-developed and well-nourished.  HENT:  Head: Normocephalic and atraumatic.  Eyes: Conjunctivae and EOM are normal. Pupils are equal, round, and reactive to light. Right eye exhibits no discharge. Left eye exhibits no discharge. No scleral icterus.  Neck: Normal range of motion. Neck supple. No JVD present. No thyromegaly present.  Cardiovascular: Normal rate, regular rhythm and normal heart sounds.   No murmur heard. Pulmonary/Chest: Effort normal. She has no wheezes. She has rales (bibasilar dry rales appreciated).  Abdominal: Soft. Bowel sounds are normal. There is no tenderness.  Musculoskeletal: Normal range of motion. She exhibits no edema or tenderness.  Neurological: She is alert. No cranial nerve deficit. She exhibits normal muscle tone. Coordination normal.  Skin: Skin is warm and dry. No rash noted.  On and off dry skin itching   Psychiatric: Her mood appears anxious. Her affect is labile. Her affect is not angry. Her speech  is tangential. Her speech is not slurred. She is agitated (when assisted with peronal care) and combative (when she doesn't understad what you want her to do). She is not aggressive, not hyperactive and not withdrawn. Thought content is not paranoid and not delusional. Cognition and memory are impaired. She expresses impulsivity and inappropriate judgment. She does not exhibit a depressed mood. She exhibits abnormal recent memory and abnormal remote memory.     Labs reviewed: No visits with results within 3 Month(s) from this visit. Latest known visit with results is:  Nursing Home on 03/26/2014  Component Date Value Ref Range Status  . Hemoglobin 03/20/2014 11.8* 12.0 - 16.0 g/dL Final  . HCT 03/20/2014 34* 36 - 46 % Final  . Platelets 03/20/2014 288  150 - 399 K/L Final  . WBC 03/20/2014 6.2   Final  . Glucose 03/20/2014 79   Final  . BUN 03/20/2014 26* 4 - 21 mg/dL Final  . Creatinine 03/20/2014 0.8  0.5 - 1.1 mg/dL Final  . Potassium 03/20/2014 4.4  3.4 - 5.3 mmol/L Final  . Sodium 03/20/2014 137  137 - 147 mmol/L Final  . Alkaline Phosphatase 03/20/2014 101  25 - 125 U/L Final  . ALT 03/20/2014 8  7 - 35 U/L Final  . AST 03/20/2014 12* 13 - 35 U/L Final  . Bilirubin, Total 03/20/2014 0.3   Final  . TSH 03/20/2014 0.74  0.41 - 5.90 uIU/mL Final       Assessment/Plan  1. Alzheimer's disease Slowly progressive, end stage. Totally dependent on staff for all activities of daily living.  2. Essential hypertension Controlled  3. Pain in joint, shoulder region, left Unchanged. Chronic.  4. Acute cystitis without hematuria None since 2015

## 2015-06-03 ENCOUNTER — Encounter: Payer: Self-pay | Admitting: Nurse Practitioner

## 2015-06-03 ENCOUNTER — Non-Acute Institutional Stay (SKILLED_NURSING_FACILITY): Payer: Medicare Other | Admitting: Nurse Practitioner

## 2015-06-03 DIAGNOSIS — I10 Essential (primary) hypertension: Secondary | ICD-10-CM | POA: Diagnosis not present

## 2015-06-03 DIAGNOSIS — M150 Primary generalized (osteo)arthritis: Secondary | ICD-10-CM

## 2015-06-03 DIAGNOSIS — G309 Alzheimer's disease, unspecified: Secondary | ICD-10-CM | POA: Diagnosis not present

## 2015-06-03 DIAGNOSIS — F411 Generalized anxiety disorder: Secondary | ICD-10-CM | POA: Diagnosis not present

## 2015-06-03 DIAGNOSIS — M159 Polyosteoarthritis, unspecified: Secondary | ICD-10-CM

## 2015-06-03 DIAGNOSIS — K5900 Constipation, unspecified: Secondary | ICD-10-CM

## 2015-06-03 DIAGNOSIS — K219 Gastro-esophageal reflux disease without esophagitis: Secondary | ICD-10-CM

## 2015-06-03 DIAGNOSIS — F0280 Dementia in other diseases classified elsewhere without behavioral disturbance: Secondary | ICD-10-CM

## 2015-06-03 NOTE — Assessment & Plan Note (Signed)
Stable on Omeprazole 20 mg daily.

## 2015-06-03 NOTE — Assessment & Plan Note (Signed)
Tylenol 650mg  bid is adequate. Prn Tylenol available to her.

## 2015-06-03 NOTE — Assessment & Plan Note (Signed)
End-stage

## 2015-06-03 NOTE — Progress Notes (Signed)
Patient ID: [name], female   DOB: [birth year], 81 y.o.   MRN: [id number]   Code Status: DNR  Allergies  Allergen Reactions  . Morphine And Related     Per MAR  . Oxytrol [Oxybutynin]     Per [facility]    Chief Complaint  Patient presents with  . Medical Management of Chronic Issues    HPI: Patient is a 81 y.o. female seen in the SNF at [facility] today for evaluation of chronic medical conditions.  Problem List Items Addressed This Visit    Alzheimer's disease - Primary (Chronic)    End stage       Essential hypertension (Chronic)    Controlled and continue with Lisinopril 10mg         Anxiety state    Manageable with Celexa 5mg  and Ativan 0.5mg  qam/0.25mg  bid prn.         Constipation    Stable, takes MiraLax daily       GERD (gastroesophageal reflux disease)    Stable on Omeprazole 20mg  daily.      Osteoarthritis    Tylenol 650mg  bid is adequate. Prn Tylenol available to her.           Review of Systems:  Review of Systems  Constitutional: Negative for fever, chills and diaphoresis.  HENT: Positive for hearing loss. Negative for congestion, ear discharge, ear pain, nosebleeds, sore throat and tinnitus.   Eyes: Negative for photophobia, pain, discharge and redness.  Respiratory: Negative for cough, shortness of breath, wheezing and stridor.   Cardiovascular: Negative for chest pain, palpitations and leg swelling.  Gastrointestinal: Negative for nausea, vomiting, abdominal pain, diarrhea, constipation and blood in stool.  Endocrine: Negative for polydipsia.  Genitourinary: Positive for frequency. Negative for dysuria, urgency, hematuria and flank pain.       Incontinent of bladder  Musculoskeletal: Positive for back pain. Negative for myalgias and neck pain.  Skin: Negative for rash.       Dry skin .  Allergic/Immunologic: Negative for environmental allergies.  Neurological: Negative for dizziness, tremors, seizures, weakness and headaches.    Hematological: Does not bruise/bleed easily.  Psychiatric/Behavioral: Negative for suicidal ideas and hallucinations. The patient is nervous/anxious.      Past Medical History  Diagnosis Date  . Malignant neoplasm of colon, unspecified site   . Anxiety state, unspecified   . Alzheimer's disease   . Unspecified essential hypertension   . Chronic airway obstruction, not elsewhere classified   . Barrett's esophagus   . Osteoarthrosis, unspecified whether generalized or localized, unspecified site   . Osteoporosis, unspecified   . Shortness of breath   . Flatulence, eructation, and gas pain   . Urinary frequency   . Hyperglycemia   . Pressure ulcer, heel(707.07)   . Pneumonia, organism unspecified 02/24/2013  . Hypotension, unspecified 02/21/2013  . Hemorrhage of gastrointestinal tract, unspecified 02/21/2013  . Altered mental status 02/21/2013  . Unspecified fall 06/13/2012  . Joint pain of lower extremity 04/15/2012  . Unspecified vitamin D deficiency 05/07/2010  . Altered mental status 04/11/2010  . Closed fracture of distal end of ulna (alone) 04/12  . Disorders of bursae and tendons in shoulder region, unspecified 06/21/2009  . Pain in joint, pelvic region and thigh 04/12/2009  . Anemia, unspecified 04/05/2009  . Hip joint replacement by other means 03/23/2009  . Unspecified constipation 10/09/2008  . Aortic aneurysm of unspecified site without mention of rupture 07/28/2005  . Osteoarthrosis, unspecified whether generalized or localized, unspecified site  07/28/2005  . Osteoporosis, unspecified 07/28/2005  . Other emphysema 07/29/1999  . Raynaud's syndrome 07/28/1998   Past Surgical History  Procedure Laterality Date  . Dilation and curettage of uterus    . Colon surgery  2001    partial colon resecton for cancer  . Cataract extraction w/ intraocular lens  implant, bilateral  2004  . Total hip arthroplasty Left 2010   Social History:   reports that she has never  smoked. She does not have any smokeless tobacco history on file. She reports that she does not drink alcohol. Her drug history is not on file.  Family History  Problem Relation Age of Onset  . Stroke Mother     Medications: Patient's Medications  New Prescriptions   No medications on file  Previous Medications   ACETAMINOPHEN (TYLENOL) 325 MG TABLET    Take 650 mg by mouth 2 (two) times daily. *Also takes 650mg  every 6 hours as needed*   CALCIUM-VITAMIN D-VITAMIN K (VIACTIV) 026-378-58 MG-UNT-MCG CHEW    Chew 1 tablet by mouth daily.   CITALOPRAM (CELEXA) 10 MG TABLET    Take 5 mg by mouth daily. Take /12 daily to help nerves and depression.   ERGOCALCIFEROL (VITAMIN D2) 50000 UNITS CAPSULE    Take 50,000 Units by mouth every 30 (thirty) days. *Takes on the 17th of each month*   LACTOSE FREE NUTRITION (BOOST) LIQD    Take 237 mLs by mouth daily.   LISINOPRIL (PRINIVIL,ZESTRIL) 10 MG TABLET    Take 10 mg by mouth daily.   LORATADINE (CLARITIN) 10 MG TABLET    Take 10 mg by mouth daily.   LORAZEPAM (ATIVAN) 0.5 MG TABLET    Take 0.25 mg by mouth every morning. 0.25mg  bid prn for anxiety.   OMEPRAZOLE (PRILOSEC) 20 MG CAPSULE    Take 20 mg by mouth daily.    POLYETHYLENE GLYCOL (MIRALAX / GLYCOLAX) PACKET    Take 17 g by mouth daily.  Modified Medications   No medications on file  Discontinued Medications   No medications on file     Physical Exam: Physical Exam  Constitutional: She appears well-developed and well-nourished.  HENT:  Head: Normocephalic and atraumatic.  Eyes: Conjunctivae and EOM are normal. Pupils are equal, round, and reactive to light. Right eye exhibits no discharge. Left eye exhibits no discharge. No scleral icterus.  Neck: Normal range of motion. Neck supple. No JVD present. No thyromegaly present.  Cardiovascular: Normal rate, regular rhythm and normal heart sounds.   No murmur heard. Pulmonary/Chest: Effort normal. She has no wheezes. She has rales  (bibasilar dry rales appreciated).  Abdominal: Soft. Bowel sounds are normal. There is no tenderness.  Musculoskeletal: Normal range of motion. She exhibits no edema or tenderness.  Neurological: She is alert. No cranial nerve deficit. She exhibits normal muscle tone. Coordination normal.  Skin: Skin is warm and dry. No rash noted.  On and off dry skin itching   Psychiatric: Her mood appears anxious. Her affect is labile. Her affect is not angry. Her speech is tangential. Her speech is not slurred. She is agitated (when assisted with peronal care) and combative (when she doesn't understad what you want her to do). She is not aggressive, not hyperactive and not withdrawn. Thought content is not paranoid and not delusional. Cognition and memory are impaired. She expresses impulsivity and inappropriate judgment. She does not exhibit a depressed mood. She exhibits abnormal recent memory and abnormal remote memory.    Filed Vitals:  06/03/15 1559  BP: 112/70  Pulse: 68  Temp: 98 F (36.7 C)  TempSrc: Tympanic  Resp: 20      Labs reviewed: Basic Metabolic Panel: No results for input(s): NA, K, CL, CO2, GLUCOSE, BUN, CREATININE, CALCIUM, MG, PHOS, TSH in the last 8760 hours. Liver Function Tests: No results for input(s): AST, ALT, ALKPHOS, BILITOT, PROT, ALBUMIN in the last 8760 hours. No results for input(s): LIPASE, AMYLASE in the last 8760 hours. No results for input(s): AMMONIA in the last 8760 hours. CBC: No results for input(s): WBC, NEUTROABS, HGB, HCT, MCV, PLT in the last 8760 hours. Lipid Panel: No results for input(s): CHOL, HDL, LDLCALC, TRIG, CHOLHDL, LDLDIRECT in the last 8760 hours.  Past Procedures:  None recently.   Assessment/Plan Alzheimer's disease End stage    Essential hypertension Controlled and continue with Lisinopril 10mg      Anxiety state Manageable with Celexa 5mg  and Ativan 0.5mg  qam/0.25mg  bid prn.      Constipation Stable, takes MiraLax  daily    GERD (gastroesophageal reflux disease) Stable on Omeprazole 20mg  daily.   Osteoarthritis Tylenol 650mg  bid is adequate. Prn Tylenol available to her.       Family/ Staff Communication: observe the patient  Goals of Care: SNF  Labs/tests ordered: none

## 2015-06-03 NOTE — Assessment & Plan Note (Signed)
Stable,  takes MiraLax daily.  

## 2015-06-03 NOTE — Assessment & Plan Note (Signed)
Manageable with Celexa 5mg  and Ativan 0.5mg  qam/0.25mg  bid prn.

## 2015-06-03 NOTE — Assessment & Plan Note (Signed)
Controlled and continue with Lisinopril 10mg 

## 2015-09-12 ENCOUNTER — Encounter: Payer: Self-pay | Admitting: Nurse Practitioner

## 2015-09-12 ENCOUNTER — Non-Acute Institutional Stay (SKILLED_NURSING_FACILITY): Payer: Medicare Other | Admitting: Nurse Practitioner

## 2015-09-12 DIAGNOSIS — K5900 Constipation, unspecified: Secondary | ICD-10-CM

## 2015-09-12 DIAGNOSIS — I10 Essential (primary) hypertension: Secondary | ICD-10-CM | POA: Diagnosis not present

## 2015-09-12 DIAGNOSIS — G309 Alzheimer's disease, unspecified: Secondary | ICD-10-CM | POA: Diagnosis not present

## 2015-09-12 DIAGNOSIS — K219 Gastro-esophageal reflux disease without esophagitis: Secondary | ICD-10-CM

## 2015-09-12 DIAGNOSIS — F0280 Dementia in other diseases classified elsewhere without behavioral disturbance: Secondary | ICD-10-CM

## 2015-09-12 DIAGNOSIS — M150 Primary generalized (osteo)arthritis: Secondary | ICD-10-CM

## 2015-09-12 DIAGNOSIS — M159 Polyosteoarthritis, unspecified: Secondary | ICD-10-CM

## 2015-09-12 DIAGNOSIS — F411 Generalized anxiety disorder: Secondary | ICD-10-CM | POA: Diagnosis not present

## 2015-09-12 NOTE — Assessment & Plan Note (Signed)
Stable, continue Celexa 5mg  and Ativan 0.5mg  qam/0.25mg  bid prn.

## 2015-09-12 NOTE — Assessment & Plan Note (Signed)
Controlled, continue Lisinopril 10mg 

## 2015-09-12 NOTE — Assessment & Plan Note (Signed)
Stable, no change in tx

## 2015-09-12 NOTE — Assessment & Plan Note (Signed)
Multiple sites, continueTylenol 650mg bid and Prn 

## 2015-09-12 NOTE — Assessment & Plan Note (Signed)
End stage, off memory preserving meds, total care of ADLs

## 2015-09-12 NOTE — Assessment & Plan Note (Signed)
Stable, continue MiraLax daily.  

## 2015-09-12 NOTE — Progress Notes (Signed)
2Patient ID: [name], female   DOB: May 05, 1917, 81 y.o.   MRN: [id number]  Location:  SNF [facility] Provider:  Marlana Latus NP  Code Status:  DNR Goals of care: Advanced Directive information    Chief Complaint  Patient presents with  . Medical Management of Chronic Issues     HPI: Patient is a 81 y.o. female seen in the SNF at [facility] today for evaluation of chronic medical conditions. Hx of Alzheimer's dementia, end stage, total care, off memory preserving meds. Her mood is stabilized while on Celexa 5mg  and Lorazepam. No constipation while on MiraLax daily. Blood pressure is controlled on Lisinopril 10mg  daily. Asymptomatic of GERD. Multiple site osteoarthritis is chronic, Tylenol help to ease the aches and pain.   Review of Systems:  Review of Systems  Constitutional: Negative for fever and chills.  HENT: Positive for hearing loss. Negative for congestion, ear discharge, ear pain and nosebleeds.   Eyes: Negative for pain, discharge and redness.  Respiratory: Negative for cough, shortness of breath and wheezing.   Cardiovascular: Negative for chest pain, palpitations and leg swelling.  Gastrointestinal: Negative for nausea, vomiting, abdominal pain, diarrhea and constipation.  Genitourinary: Positive for frequency. Negative for dysuria and urgency.       Incontinent of bladder  Musculoskeletal: Positive for back pain. Negative for myalgias and neck pain.  Skin: Negative for rash.       Dry skin .  Neurological: Negative for dizziness, tremors, seizures, weakness and headaches.  Psychiatric/Behavioral: Positive for memory loss. Negative for suicidal ideas and hallucinations. The patient is nervous/anxious.     Past Medical History  Diagnosis Date  . Malignant neoplasm of colon, unspecified site   . Anxiety state, unspecified   . Alzheimer's disease   . Unspecified essential hypertension   . Chronic airway obstruction, not elsewhere classified   . Barrett's  esophagus   . Osteoarthrosis, unspecified whether generalized or localized, unspecified site   . Osteoporosis, unspecified   . Shortness of breath   . Flatulence, eructation, and gas pain   . Urinary frequency   . Hyperglycemia   . Pressure ulcer, heel(707.07)   . Pneumonia, organism unspecified 02/24/2013  . Hypotension, unspecified 02/21/2013  . Hemorrhage of gastrointestinal tract, unspecified 02/21/2013  . Altered mental status 02/21/2013  . Unspecified fall 06/13/2012  . Joint pain of lower extremity 04/15/2012  . Unspecified vitamin D deficiency 05/07/2010  . Altered mental status 04/11/2010  . Closed fracture of distal end of ulna (alone) 04/12  . Disorders of bursae and tendons in shoulder region, unspecified 06/21/2009  . Pain in joint, pelvic region and thigh 04/12/2009  . Anemia, unspecified 04/05/2009  . Hip joint replacement by other means 03/23/2009  . Unspecified constipation 10/09/2008  . Aortic aneurysm of unspecified site without mention of rupture 07/28/2005  . Osteoarthrosis, unspecified whether generalized or localized, unspecified site 07/28/2005  . Osteoporosis, unspecified 07/28/2005  . Other emphysema 07/29/1999  . Raynaud's syndrome 07/28/1998    Patient Active Problem List   Diagnosis Date Noted  . Seasonal allergies 08/27/2014  . Acute cystitis 03/26/2014  . Fall at nursing home 03/19/2014  . 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 Malignant neoplasm of colon, unspecified site   . Alzheimer's disease   . Essential hypertension   . Chronic airway obstruction, not elsewhere classified   . Barrett's esophagus   . Osteoarthritis   . Pain in joint, shoulder region   . Osteoporosis   . GERD (gastroesophageal reflux disease) 05/03/2013  .  Anxiety state 03/24/2013  . Constipation 03/24/2013    Allergies  Allergen Reactions  . Morphine And Related     Per MAR  . Oxytrol [Oxybutynin]     Per MAR    Medications: Patient's Medications  New Prescriptions     No medications on file  Previous Medications   ACETAMINOPHEN (TYLENOL) 325 MG TABLET    Take 650 mg by mouth 2 (two) times daily. *Also takes 650mg  every 6 hours as needed*   CALCIUM-VITAMIN D-VITAMIN K (VIACTIV) 119-147-82 MG-UNT-MCG CHEW    Chew 1 tablet by mouth daily.   CITALOPRAM (CELEXA) 10 MG TABLET    Take 5 mg by mouth daily. Take /12 daily to help nerves and depression.   ERGOCALCIFEROL (VITAMIN D2) 50000 UNITS CAPSULE    Take 50,000 Units by mouth every 30 (thirty) days. *Takes on the 17th of each month*   LACTOSE FREE NUTRITION (BOOST) LIQD    Take 237 mLs by mouth daily.   LISINOPRIL (PRINIVIL,ZESTRIL) 10 MG TABLET    Take 10 mg by mouth daily.   LORATADINE (CLARITIN) 10 MG TABLET    Take 10 mg by mouth daily.   LORAZEPAM (ATIVAN) 0.5 MG TABLET    Take 0.25 mg by mouth every morning. 0.25mg  bid prn for anxiety.   OMEPRAZOLE (PRILOSEC) 20 MG CAPSULE    Take 20 mg by mouth daily.    POLYETHYLENE GLYCOL (MIRALAX / GLYCOLAX) PACKET    Take 17 g by mouth daily.  Modified Medications   No medications on file  Discontinued Medications   No medications on file    Physical Exam: Filed Vitals:   09/12/15 1140  BP: 112/68  Pulse: 62  Temp: 98.6 F (37 C)  TempSrc: Tympanic  Resp: 18   There is no weight on file to calculate BMI.  Physical Exam  Constitutional: She appears well-developed and well-nourished.  HENT:  Head: Normocephalic and atraumatic.  Eyes: Conjunctivae and EOM are normal. Pupils are equal, round, and reactive to light. Right eye exhibits no discharge. Left eye exhibits no discharge. No scleral icterus.  Neck: Normal range of motion. Neck supple. No JVD present. No thyromegaly present.  Cardiovascular: Normal rate, regular rhythm and normal heart sounds.   No murmur heard. Pulmonary/Chest: Effort normal. She has no wheezes. She has rales (bibasilar dry rales appreciated).  Abdominal: Soft. Bowel sounds are normal. There is no tenderness.  Musculoskeletal:  Normal range of motion. She exhibits no edema or tenderness.  Neurological: She is alert. No cranial nerve deficit. She exhibits normal muscle tone. Coordination normal.  Skin: Skin is warm and dry. No rash noted.  On and off dry skin itching   Psychiatric: Her mood appears anxious. Her affect is labile. Her affect is not angry. Her speech is tangential. Her speech is not slurred. She is agitated (when assisted with peronal care) and combative (when she doesn't understad what you want her to do). She is not aggressive, not hyperactive and not withdrawn. Thought content is not paranoid and not delusional. Cognition and memory are impaired. She expresses impulsivity and inappropriate judgment. She does not exhibit a depressed mood. She exhibits abnormal recent memory and abnormal remote memory.    Labs reviewed: Basic Metabolic Panel: No results for input(s): NA, K, CL, CO2, GLUCOSE, BUN, CREATININE, CALCIUM, MG, PHOS in the last 8760 hours.  Liver Function Tests: No results for input(s): AST, ALT, ALKPHOS, BILITOT, PROT, ALBUMIN in the last 8760 hours.  CBC: No results for input(s): WBC,  NEUTROABS, HGB, HCT, MCV, PLT in the last 8760 hours.  Lab Results  Component Value Date   TSH 0.74 03/20/2014   No results found for: HGBA1C No results found for: CHOL, HDL, LDLCALC, LDLDIRECT, TRIG, CHOLHDL  Significant Diagnostic Results since last visit: none  Patient Care Team: Estill Dooms, MD as PCP - General (Internal Medicine) [place] Jozee Hammer X, NP as Nurse Practitioner (Nurse Practitioner)  Assessment/Plan Problem List Items Addressed This Visit    Alzheimer's disease - Primary (Chronic)    End stage, off memory preserving meds, total care of ADLs      Essential hypertension (Chronic)    Controlled, continue Lisinopril 10mg       Anxiety state    Stable, continue Celexa 5mg  and Ativan 0.5mg  qam/0.25mg  bid prn.      Constipation    Stable, continue MiraLax daily       GERD (gastroesophageal reflux disease)    Stable, no change in tx      Osteoarthritis    Multiple sites, continueTylenol 650mg  bid and Prn           Family/ staff Communication: continue SNF for care needs.   Labs/tests ordered: none  ManXie Kathrine Rieves NP [facility] [address],  [address] On Call:  [phone] & follow prompts after 5pm & weekends Office Phone:  [phone] Office Fax:  [phone]

## 2015-10-02 ENCOUNTER — Non-Acute Institutional Stay (SKILLED_NURSING_FACILITY): Payer: Medicare Other | Admitting: Nurse Practitioner

## 2015-10-02 ENCOUNTER — Encounter: Payer: Self-pay | Admitting: Nurse Practitioner

## 2015-10-02 DIAGNOSIS — I10 Essential (primary) hypertension: Secondary | ICD-10-CM | POA: Diagnosis not present

## 2015-10-02 DIAGNOSIS — K219 Gastro-esophageal reflux disease without esophagitis: Secondary | ICD-10-CM

## 2015-10-02 DIAGNOSIS — K5900 Constipation, unspecified: Secondary | ICD-10-CM | POA: Diagnosis not present

## 2015-10-02 DIAGNOSIS — F411 Generalized anxiety disorder: Secondary | ICD-10-CM

## 2015-10-02 DIAGNOSIS — F0280 Dementia in other diseases classified elsewhere without behavioral disturbance: Secondary | ICD-10-CM

## 2015-10-02 DIAGNOSIS — G309 Alzheimer's disease, unspecified: Secondary | ICD-10-CM

## 2015-10-02 NOTE — Assessment & Plan Note (Signed)
Stable, continue Celexa 5mg  and dc Ativan 0.5mg  qam/0.25mg  bid prn.

## 2015-10-02 NOTE — Assessment & Plan Note (Signed)
Stable, continue MiraLax daily.  

## 2015-10-02 NOTE — Assessment & Plan Note (Signed)
Controlled, continue Lisinopril 10mg 

## 2015-10-02 NOTE — Assessment & Plan Note (Signed)
Asymptomatic. 

## 2015-10-02 NOTE — Assessment & Plan Note (Signed)
End stage, off memory preserving meds, total care of ADLs

## 2015-10-02 NOTE — Progress Notes (Signed)
Patient ID: [name], female   DOB: 17-Jan-1917, 81 y.o.   MRN: [id number]  Location:  SNF [facility] Provider:  Marlana Latus NP  Code Status:  DNR Goals of care: Advanced Directive information    Chief Complaint  Patient presents with  . Medical Management of Chronic Issues     HPI: Patient is a 80 y.o. female seen in the SNF at [facility] today for evaluation of chronic medical conditions. Hx of Alzheimer's dementia, end stage, total care, off memory preserving meds. Mood has been  stabilized while on Celexa 5mg  and Lorazepam. Regular BM while on MiraLax daily. Blood pressure is controlled on Lisinopril 10mg  daily. Asymptomatic of GERD. Multiple site osteoarthritis is chronic, Tylenol help to ease the aches and pain.   Review of Systems:  Review of Systems  Constitutional: Negative for fever and chills.  HENT: Positive for hearing loss. Negative for congestion and ear discharge.   Eyes: Negative for pain and redness.  Respiratory: Negative for cough, shortness of breath and wheezing.   Cardiovascular: Negative for chest pain, palpitations and leg swelling.  Gastrointestinal: Negative for nausea, vomiting, abdominal pain, diarrhea and constipation.  Genitourinary: Positive for frequency. Negative for dysuria and urgency.       Incontinent of bladder  Musculoskeletal: Positive for back pain. Negative for myalgias and neck pain.  Skin: Negative for rash.       Dry skin .  Neurological: Negative for dizziness, tremors, seizures, weakness and headaches.  Psychiatric/Behavioral: Positive for memory loss. Negative for suicidal ideas and hallucinations. The patient is nervous/anxious.     Past Medical History  Diagnosis Date  . Malignant neoplasm of colon, unspecified site   . Anxiety state, unspecified   . Alzheimer's disease   . Unspecified essential hypertension   . Chronic airway obstruction, not elsewhere classified   . Barrett's esophagus   . Osteoarthrosis, unspecified  whether generalized or localized, unspecified site   . Osteoporosis, unspecified   . Shortness of breath   . Flatulence, eructation, and gas pain   . Urinary frequency   . Hyperglycemia   . Pressure ulcer, heel(707.07)   . Pneumonia, organism unspecified 02/24/2013  . Hypotension, unspecified 02/21/2013  . Hemorrhage of gastrointestinal tract, unspecified 02/21/2013  . Altered mental status 02/21/2013  . Unspecified fall 06/13/2012  . Joint pain of lower extremity 04/15/2012  . Unspecified vitamin D deficiency 05/07/2010  . Altered mental status 04/11/2010  . Closed fracture of distal end of ulna (alone) 04/12  . Disorders of bursae and tendons in shoulder region, unspecified 06/21/2009  . Pain in joint, pelvic region and thigh 04/12/2009  . Anemia, unspecified 04/05/2009  . Hip joint replacement by other means 03/23/2009  . Unspecified constipation 10/09/2008  . Aortic aneurysm of unspecified site without mention of rupture 07/28/2005  . Osteoarthrosis, unspecified whether generalized or localized, unspecified site 07/28/2005  . Osteoporosis, unspecified 07/28/2005  . Other emphysema ([place]) 07/29/1999  . Raynaud's syndrome 07/28/1998    Patient Active Problem List   Diagnosis Date Noted  . Seasonal allergies 08/27/2014  . Acute cystitis 03/26/2014  . Fall at nursing home 03/19/2014  . 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 Malignant neoplasm of colon, unspecified site   . Alzheimer's disease   . Essential hypertension   . Chronic airway obstruction, not elsewhere classified   . Barrett's esophagus   . Osteoarthritis   . Pain in joint, shoulder region   . Osteoporosis   . GERD (gastroesophageal reflux disease) 05/03/2013  .  Anxiety state 03/24/2013  . Constipation 03/24/2013    Allergies  Allergen Reactions  . Morphine And Related     Per MAR  . Oxytrol [Oxybutynin]     Per MAR    Medications: Patient's Medications  New Prescriptions   No medications on file  Previous  Medications   ACETAMINOPHEN (TYLENOL) 325 MG TABLET    Take 650 mg by mouth 2 (two) times daily. *Also takes 650mg  every 6 hours as needed*   CALCIUM-VITAMIN D-VITAMIN K (VIACTIV) 528-413-24 MG-UNT-MCG CHEW    Chew 1 tablet by mouth daily.   CITALOPRAM (CELEXA) 10 MG TABLET    Take 5 mg by mouth daily. Take /12 daily to help nerves and depression.   ERGOCALCIFEROL (VITAMIN D2) 50000 UNITS CAPSULE    Take 50,000 Units by mouth every 30 (thirty) days. *Takes on the 17th of each month*   LACTOSE FREE NUTRITION (BOOST) LIQD    Take 237 mLs by mouth daily.   LISINOPRIL (PRINIVIL,ZESTRIL) 10 MG TABLET    Take 10 mg by mouth daily.   LORATADINE (CLARITIN) 10 MG TABLET    Take 10 mg by mouth daily.   LORAZEPAM (ATIVAN) 0.5 MG TABLET    Take 0.25 mg by mouth every morning. 0.25mg  bid prn for anxiety.   OMEPRAZOLE (PRILOSEC) 20 MG CAPSULE    Take 20 mg by mouth daily.    POLYETHYLENE GLYCOL (MIRALAX / GLYCOLAX) PACKET    Take 17 g by mouth daily.  Modified Medications   No medications on file  Discontinued Medications   No medications on file    Physical Exam: Filed Vitals:   10/02/15 1404  BP: 136/70  Pulse: 70  Temp: 98.2 F (36.8 C)  TempSrc: Tympanic  Resp: 16   There is no weight on file to calculate BMI.  Physical Exam  Constitutional: She appears well-developed and well-nourished.  HENT:  Head: Normocephalic and atraumatic.  Eyes: Conjunctivae and EOM are normal. Pupils are equal, round, and reactive to light. Right eye exhibits no discharge. Left eye exhibits no discharge.  Neck: Normal range of motion. Neck supple. No JVD present. No thyromegaly present.  Cardiovascular: Normal rate, regular rhythm and normal heart sounds.   No murmur heard. Pulmonary/Chest: Effort normal. She has no wheezes. She has rales (bibasilar dry rales appreciated).  Abdominal: Soft. Bowel sounds are normal. There is no tenderness.  Musculoskeletal: Normal range of motion. She exhibits no edema or  tenderness.  Neurological: She is alert. No cranial nerve deficit. She exhibits normal muscle tone. Coordination normal.  Skin: Skin is warm and dry. No rash noted.  On and off dry skin itching   Psychiatric: Her mood appears anxious. Her affect is labile. Her affect is not angry. Her speech is tangential. Her speech is not slurred. She is agitated (when assisted with peronal care) and combative (when she doesn't understad what you want her to do). She is not aggressive, not hyperactive and not withdrawn. Thought content is not paranoid and not delusional. Cognition and memory are impaired. She expresses impulsivity and inappropriate judgment. She does not exhibit a depressed mood. She exhibits abnormal recent memory and abnormal remote memory.    Labs reviewed: Basic Metabolic Panel: No results for input(s): NA, K, CL, CO2, GLUCOSE, BUN, CREATININE, CALCIUM, MG, PHOS in the last 8760 hours.  Liver Function Tests: No results for input(s): AST, ALT, ALKPHOS, BILITOT, PROT, ALBUMIN in the last 8760 hours.  CBC: No results for input(s): WBC, NEUTROABS, HGB, HCT, MCV,  PLT in the last 8760 hours.  Lab Results  Component Value Date   TSH 0.74 03/20/2014   No results found for: HGBA1C No results found for: CHOL, HDL, LDLCALC, LDLDIRECT, TRIG, CHOLHDL  Significant Diagnostic Results since last visit: none  Patient Care Team: Estill Dooms, MD as PCP - General (Internal Medicine) [place] Kirstein Baxley X, NP as Nurse Practitioner (Nurse Practitioner)  Assessment/Plan Problem List Items Addressed This Visit    GERD (gastroesophageal reflux disease)    Asymptomatic       Essential hypertension (Chronic)    Controlled, continue Lisinopril 10mg       Constipation    Stable, continue MiraLax daily      Anxiety state    Stable, continue Celexa 5mg  and dc Ativan 0.5mg  qam/0.25mg  bid prn.      Alzheimer's disease - Primary (Chronic)    End stage, off memory preserving meds,  total care of ADLs          Family/ staff Communication: continue SNF for care needs.   Labs/tests ordered: none  ManXie Navaya Wiatrek NP [facility] [address],  [address] On Call:  [phone] & follow prompts after 5pm & weekends Office Phone:  [phone] Office Fax:  [phone]

## 2015-10-21 ENCOUNTER — Non-Acute Institutional Stay (SKILLED_NURSING_FACILITY): Payer: Medicare Other | Admitting: Internal Medicine

## 2015-10-21 ENCOUNTER — Encounter: Payer: Self-pay | Admitting: Internal Medicine

## 2015-10-21 DIAGNOSIS — F0280 Dementia in other diseases classified elsewhere without behavioral disturbance: Secondary | ICD-10-CM | POA: Diagnosis not present

## 2015-10-21 DIAGNOSIS — I10 Essential (primary) hypertension: Secondary | ICD-10-CM

## 2015-10-21 DIAGNOSIS — G309 Alzheimer's disease, unspecified: Secondary | ICD-10-CM

## 2015-10-21 NOTE — Progress Notes (Signed)
Patient ID: [name], female   DOB: [birth year], 81 y.o.   MRN: [id number]    [facility] Room Number: 16  Place of Service: SNF (31)     Allergies  Allergen Reactions  . Morphine And Related     Per MAR  . Oxytrol [Oxybutynin]     Per [facility]    Chief Complaint  Patient presents with  . Medical Management of Chronic Issues    HPI:  Alzheimer's dementia without behavioral disturbance, unspecified timing of dementia onset - continues to decline slowly.  Essential hypertension - controlled    Medications: Patient's Medications  New Prescriptions   No medications on file  Previous Medications   ACETAMINOPHEN (TYLENOL) 325 MG TABLET    Take 650 mg by mouth 2 (two) times daily. *Also takes 67m every 6 hours as needed*   CALCIUM-VITAMIN D-VITAMIN K (VIACTIV) 5034-917-91MG-UNT-MCG CHEW    Chew 1 tablet by mouth daily.   CITALOPRAM (CELEXA) 10 MG TABLET    Take 5 mg by mouth daily. Take /12 daily to help nerves and depression.   ERGOCALCIFEROL (VITAMIN D2) 50000 UNITS CAPSULE    Take 50,000 Units by mouth every 30 (thirty) days. *Takes on the 17th of each month*   LACTOSE FREE NUTRITION (BOOST) LIQD    Take 237 mLs by mouth daily.   LISINOPRIL (PRINIVIL,ZESTRIL) 10 MG TABLET    Take 10 mg by mouth daily.   LORATADINE (CLARITIN) 10 MG TABLET    Take 10 mg by mouth daily.   LORAZEPAM (ATIVAN) 0.5 MG TABLET    Take 0.25 mg by mouth every morning. 0.275mbid prn for anxiety.   OMEPRAZOLE (PRILOSEC) 20 MG CAPSULE    Take 20 mg by mouth daily.    POLYETHYLENE GLYCOL (MIRALAX / GLYCOLAX) PACKET    Take 17 g by mouth daily.  Modified Medications   No medications on file  Discontinued Medications   No medications on file     Review of Systems  Constitutional: Negative for fever, chills and diaphoresis.  HENT: Positive for hearing loss. Negative for congestion, ear discharge, ear pain, nosebleeds, sore throat and tinnitus.   Eyes: Negative for  photophobia, pain, discharge and redness.  Respiratory: Negative for cough, shortness of breath, wheezing and stridor.   Cardiovascular: Negative for chest pain, palpitations and leg swelling.  Gastrointestinal: Negative for nausea, vomiting, abdominal pain, diarrhea, constipation and blood in stool.  Endocrine: Negative for polydipsia.  Genitourinary: Positive for frequency. Negative for dysuria, urgency, hematuria and flank pain.       Incontinent of bladder  Musculoskeletal: Positive for back pain. Negative for myalgias and neck pain.  Skin: Negative for rash.       Dry skin .  Allergic/Immunologic: Negative for environmental allergies.  Neurological: Negative for dizziness, tremors, seizures, weakness and headaches.  Hematological: Does not bruise/bleed easily.  Psychiatric/Behavioral: Negative for suicidal ideas and hallucinations. The patient is nervous/anxious.     Filed Vitals:   10/21/15 1607  BP: 122/60  Pulse: 72  Temp: 97.3 F (36.3 C)  Resp: 18  Height: 5' 3"  (1.6 m)  Weight: 132 lb 4.8 oz (60.011 kg)   Body mass index is 23.44 kg/(m^2).  Physical Exam  Constitutional: She appears well-developed and well-nourished.  HENT:  Head: Normocephalic and atraumatic.  Eyes: Conjunctivae and EOM are normal. Pupils are equal, round, and reactive to light. Right eye exhibits no discharge. Left eye exhibits no discharge. No scleral icterus.  Neck: Normal range of motion. Neck supple. No JVD present. No thyromegaly present.  Cardiovascular: Normal rate, regular rhythm and normal heart sounds.   No murmur heard. Pulmonary/Chest: Effort normal. She has no wheezes. She has rales (bibasilar dry rales appreciated).  Abdominal: Soft. Bowel sounds are normal. There is no tenderness.  Musculoskeletal: Normal range of motion. She exhibits no edema or tenderness.  Neurological: She is alert. No cranial nerve deficit. She exhibits normal muscle tone. Coordination normal.  Skin: Skin is  warm and dry. No rash noted.  On and off dry skin itching   Psychiatric: Her mood appears anxious. Her affect is labile. Her affect is not angry. Her speech is tangential. Her speech is not slurred. She is agitated (when assisted with peronal care) and combative (when she doesn't understad what you want her to do). She is not aggressive, not hyperactive and not withdrawn. Thought content is not paranoid and not delusional. Cognition and memory are impaired. She expresses impulsivity and inappropriate judgment. She does not exhibit a depressed mood. She exhibits abnormal recent memory and abnormal remote memory.     Labs reviewed: Lab Summary Latest Ref Rng 03/20/2014  Hemoglobin 12.0 - 16.0 g/dL 11.8(A)  Hematocrit 36 - 46 % 34(A)  White count - 6.2  Platelet count 150 - 399 K/L 288  Sodium 137 - 147 mmol/L 137  Potassium 3.4 - 5.3 mmol/L 4.4  Calcium - (None)  Phosphorus - (None)  Creatinine 0.5 - 1.1 mg/dL 0.8  AST 13 - 35 U/L 12(A)  Alk Phos 25 - 125 U/L 101  Bilirubin - (None)  Glucose - 79  Cholesterol - (None)  HDL cholesterol - (None)  Triglycerides - (None)  LDL Direct - (None)  LDL Calc - (None)  Total protein - (None)  Albumin - (None)   Lab Results  Component Value Date   TSH 0.74 03/20/2014   Lab Results  Component Value Date   BUN 26* 03/20/2014   No results found for: HGBA1C     Assessment/Plan 1. Alzheimer's dementia without behavioral disturbance, unspecified timing of dementia onset Continue current medications  2. Essential hypertension Continue lisinopril

## 2015-11-06 ENCOUNTER — Non-Acute Institutional Stay (SKILLED_NURSING_FACILITY): Payer: Medicare Other | Admitting: Nurse Practitioner

## 2015-11-06 ENCOUNTER — Encounter: Payer: Self-pay | Admitting: Nurse Practitioner

## 2015-11-06 DIAGNOSIS — M150 Primary generalized (osteo)arthritis: Secondary | ICD-10-CM

## 2015-11-06 DIAGNOSIS — K219 Gastro-esophageal reflux disease without esophagitis: Secondary | ICD-10-CM

## 2015-11-06 DIAGNOSIS — I10 Essential (primary) hypertension: Secondary | ICD-10-CM

## 2015-11-06 DIAGNOSIS — F411 Generalized anxiety disorder: Secondary | ICD-10-CM

## 2015-11-06 DIAGNOSIS — K5900 Constipation, unspecified: Secondary | ICD-10-CM

## 2015-11-06 DIAGNOSIS — G308 Other Alzheimer's disease: Secondary | ICD-10-CM | POA: Diagnosis not present

## 2015-11-06 DIAGNOSIS — M159 Polyosteoarthritis, unspecified: Secondary | ICD-10-CM

## 2015-11-06 DIAGNOSIS — F0280 Dementia in other diseases classified elsewhere without behavioral disturbance: Secondary | ICD-10-CM

## 2015-11-06 NOTE — Assessment & Plan Note (Signed)
Controlled, continue Lisinopril 10mg 

## 2015-11-06 NOTE — Assessment & Plan Note (Signed)
Asymptomatic. 

## 2015-11-06 NOTE — Assessment & Plan Note (Signed)
End stage, off memory preserving meds, total care of ADLs

## 2015-11-06 NOTE — Assessment & Plan Note (Signed)
Multiple sites, continueTylenol 650mg bid and Prn 

## 2015-11-06 NOTE — Progress Notes (Signed)
Patient ID: [name], female   DOB: November 01, 1917, 81 y.o.   MRN: [id number]  Location:  SNF [facility] Provider:  Marlana Latus NP  Code Status:  DNR Goals of care: Advanced Directive information    Chief Complaint  Patient presents with  . Medical Management of Chronic Issues     HPI: Patient is a 81 y.o. female seen in the SNF at [facility] today for evaluation of chronic medical conditions. Hx of Alzheimer's dementia, end stage, total care, off memory preserving meds. Mood has been  stabilized while on Celexa 5mg  and Lorazepam. Regular BM while on MiraLax daily. Blood pressure is controlled on Lisinopril 10mg  daily. Asymptomatic of GERD. Multiple site osteoarthritis is chronic, Tylenol help to ease the aches and pain.   Review of Systems:  Review of Systems  Constitutional: Negative for fever and chills.  HENT: Positive for hearing loss. Negative for congestion and ear discharge.   Eyes: Negative for pain and redness.  Respiratory: Negative for cough, shortness of breath and wheezing.   Cardiovascular: Negative for chest pain, palpitations and leg swelling.  Gastrointestinal: Negative for nausea, vomiting, abdominal pain, diarrhea and constipation.  Genitourinary: Positive for frequency. Negative for dysuria and urgency.       Incontinent of bladder  Musculoskeletal: Positive for back pain. Negative for myalgias and neck pain.  Skin: Negative for rash.       Dry skin .  Neurological: Negative for dizziness, tremors, seizures, weakness and headaches.  Psychiatric/Behavioral: Positive for memory loss. Negative for suicidal ideas and hallucinations. The patient is nervous/anxious.     Past Medical History  Diagnosis Date  . Malignant neoplasm of colon, unspecified site   . Anxiety state, unspecified   . Alzheimer's disease   . Unspecified essential hypertension   . Chronic airway obstruction, not elsewhere classified   . Barrett's esophagus   . Osteoarthrosis, unspecified  whether generalized or localized, unspecified site   . Osteoporosis, unspecified   . Shortness of breath   . Flatulence, eructation, and gas pain   . Urinary frequency   . Hyperglycemia   . Pressure ulcer, heel(707.07)   . Pneumonia, organism unspecified 02/24/2013  . Hypotension, unspecified 02/21/2013  . Hemorrhage of gastrointestinal tract, unspecified 02/21/2013  . Altered mental status 02/21/2013  . Unspecified fall 06/13/2012  . Joint pain of lower extremity 04/15/2012  . Unspecified vitamin D deficiency 05/07/2010  . Altered mental status 04/11/2010  . Closed fracture of distal end of ulna (alone) 04/12  . Disorders of bursae and tendons in shoulder region, unspecified 06/21/2009  . Pain in joint, pelvic region and thigh 04/12/2009  . Anemia, unspecified 04/05/2009  . Hip joint replacement by other means 03/23/2009  . Unspecified constipation 10/09/2008  . Aortic aneurysm of unspecified site without mention of rupture 07/28/2005  . Osteoarthrosis, unspecified whether generalized or localized, unspecified site 07/28/2005  . Osteoporosis, unspecified 07/28/2005  . Other emphysema ([place]) 07/29/1999  . Raynaud's syndrome 07/28/1998    Patient Active Problem List   Diagnosis Date Noted  . Seasonal allergies 08/27/2014  . Fall at nursing home 03/19/2014  . 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 Malignant neoplasm of colon, unspecified site   . Alzheimer's disease   . Essential hypertension   . Chronic airway obstruction, not elsewhere classified   . Barrett's esophagus   . Osteoarthritis   . Pain in joint, shoulder region   . Osteoporosis   . GERD (gastroesophageal reflux disease) 05/03/2013  . Anxiety state 03/24/2013  .  Constipation 03/24/2013    Allergies  Allergen Reactions  . Morphine And Related     Per MAR  . Oxytrol [Oxybutynin]     Per MAR    Medications: Patient's Medications  New Prescriptions   No medications on file  Previous Medications   ACETAMINOPHEN  (TYLENOL) 325 MG TABLET    Take 650 mg by mouth 2 (two) times daily. *Also takes 650mg  every 6 hours as needed*   CALCIUM-VITAMIN D-VITAMIN K (VIACTIV) 563-149-70 MG-UNT-MCG CHEW    Chew 1 tablet by mouth daily.   CITALOPRAM (CELEXA) 10 MG TABLET    Take 5 mg by mouth daily. Take /12 daily to help nerves and depression.   ERGOCALCIFEROL (VITAMIN D2) 50000 UNITS CAPSULE    Take 50,000 Units by mouth every 30 (thirty) days. *Takes on the 17th of each month*   LACTOSE FREE NUTRITION (BOOST) LIQD    Take 237 mLs by mouth daily.   LISINOPRIL (PRINIVIL,ZESTRIL) 10 MG TABLET    Take 10 mg by mouth daily.   LORATADINE (CLARITIN) 10 MG TABLET    Take 10 mg by mouth daily.   LORAZEPAM (ATIVAN) 0.5 MG TABLET    Take 0.25 mg by mouth every morning. 0.25mg  bid prn for anxiety.   OMEPRAZOLE (PRILOSEC) 20 MG CAPSULE    Take 20 mg by mouth daily.    POLYETHYLENE GLYCOL (MIRALAX / GLYCOLAX) PACKET    Take 17 g by mouth daily.  Modified Medications   No medications on file  Discontinued Medications   No medications on file    Physical Exam: Filed Vitals:   11/06/15 1445  BP: 120/68  Pulse: 72  Temp: 97.4 F (36.3 C)  TempSrc: Tympanic  Resp: 16   There is no weight on file to calculate BMI.  Physical Exam  Constitutional: She appears well-developed and well-nourished.  HENT:  Head: Normocephalic and atraumatic.  Eyes: Conjunctivae and EOM are normal. Pupils are equal, round, and reactive to light. Right eye exhibits no discharge. Left eye exhibits no discharge.  Neck: Normal range of motion. Neck supple. No JVD present. No thyromegaly present.  Cardiovascular: Normal rate, regular rhythm and normal heart sounds.   No murmur heard. Pulmonary/Chest: Effort normal. She has no wheezes. She has rales (bibasilar dry rales appreciated).  Abdominal: Soft. Bowel sounds are normal. There is no tenderness.  Musculoskeletal: Normal range of motion. She exhibits no edema or tenderness.  Neurological: She is  alert. No cranial nerve deficit. She exhibits normal muscle tone. Coordination normal.  Skin: Skin is warm and dry. No rash noted.  On and off dry skin itching   Psychiatric: Her mood appears anxious. Her affect is labile. Her affect is not angry. Her speech is tangential. Her speech is not slurred. She is agitated (when assisted with peronal care) and combative (when she doesn't understad what you want her to do). She is not aggressive, not hyperactive and not withdrawn. Thought content is not paranoid and not delusional. Cognition and memory are impaired. She expresses impulsivity and inappropriate judgment. She does not exhibit a depressed mood. She exhibits abnormal recent memory and abnormal remote memory.    Labs reviewed: Basic Metabolic Panel: No results for input(s): NA, K, CL, CO2, GLUCOSE, BUN, CREATININE, CALCIUM, MG, PHOS in the last 8760 hours.  Liver Function Tests: No results for input(s): AST, ALT, ALKPHOS, BILITOT, PROT, ALBUMIN in the last 8760 hours.  CBC: No results for input(s): WBC, NEUTROABS, HGB, HCT, MCV, PLT in the last 8760  hours.  Lab Results  Component Value Date   TSH 0.74 03/20/2014   No results found for: HGBA1C No results found for: CHOL, HDL, LDLCALC, LDLDIRECT, TRIG, CHOLHDL  Significant Diagnostic Results since last visit: none  Patient Care Team: Estill Dooms, MD as PCP - General (Internal Medicine) Beaumont Juron Vorhees X, NP as Nurse Practitioner (Nurse Practitioner)  Assessment/Plan Problem List Items Addressed This Visit    Alzheimer's disease - Primary (Chronic)    End stage, off memory preserving meds, total care of ADLs      Essential hypertension (Chronic)    Controlled, continue Lisinopril 10mg       Anxiety state    Stable, continue Celexa 5mg        Constipation    Stable, continue MiraLax daily      GERD (gastroesophageal reflux disease)    Asymptomatic       Osteoarthritis    Multiple sites,  continueTylenol 650mg  bid and Prn           Family/ staff Communication: continue SNF for care needs.   Labs/tests ordered: none  ManXie Talon Regala NP [facility] [address], [place] [address] On Call:  [phone] & follow prompts after 5pm & weekends Office Phone:  [phone] Office Fax:  [phone]

## 2015-11-06 NOTE — Assessment & Plan Note (Signed)
Stable, continue MiraLax daily.  

## 2015-11-06 NOTE — Assessment & Plan Note (Signed)
Stable, continue Celexa 5mg 

## 2015-12-05 ENCOUNTER — Encounter: Payer: Self-pay | Admitting: Nurse Practitioner

## 2015-12-05 ENCOUNTER — Non-Acute Institutional Stay (SKILLED_NURSING_FACILITY): Payer: Medicare Other | Admitting: Nurse Practitioner

## 2015-12-05 DIAGNOSIS — G308 Other Alzheimer's disease: Secondary | ICD-10-CM

## 2015-12-05 DIAGNOSIS — F411 Generalized anxiety disorder: Secondary | ICD-10-CM | POA: Diagnosis not present

## 2015-12-05 DIAGNOSIS — K5900 Constipation, unspecified: Secondary | ICD-10-CM | POA: Diagnosis not present

## 2015-12-05 DIAGNOSIS — F0280 Dementia in other diseases classified elsewhere without behavioral disturbance: Secondary | ICD-10-CM

## 2015-12-05 DIAGNOSIS — I10 Essential (primary) hypertension: Secondary | ICD-10-CM

## 2015-12-05 DIAGNOSIS — K219 Gastro-esophageal reflux disease without esophagitis: Secondary | ICD-10-CM

## 2015-12-05 DIAGNOSIS — M159 Polyosteoarthritis, unspecified: Secondary | ICD-10-CM

## 2015-12-05 DIAGNOSIS — M150 Primary generalized (osteo)arthritis: Secondary | ICD-10-CM

## 2015-12-05 NOTE — Assessment & Plan Note (Signed)
Multiple sites, continueTylenol 650mg bid and Prn 

## 2015-12-05 NOTE — Assessment & Plan Note (Signed)
Stable, continue MiraLax daily.  

## 2015-12-05 NOTE — Assessment & Plan Note (Signed)
Stable, continue Celexa 5mg  

## 2015-12-05 NOTE — Assessment & Plan Note (Signed)
Controlled, continue Lisinopril 10mg 

## 2015-12-05 NOTE — Progress Notes (Signed)
This encounter was created in error - please disregard.

## 2015-12-05 NOTE — Assessment & Plan Note (Signed)
End stage, off memory preserving meds, total care of ADLs 

## 2015-12-05 NOTE — Progress Notes (Signed)
Patient ID: [name], female   DOB: 1917-08-16, 81 y.o.   MRN: [id number]  Location:  SNF [facility] Provider:  Marlana Latus NP  Code Status:  DNR Goals of care: Advanced Directive information    Chief Complaint  Patient presents with  . Medical Management of Chronic Issues     HPI: Patient is a 81 y.o. female seen in the SNF at [facility] today for evaluation of Hx of Alzheimer's dementia, end stage, total care, off memory preserving meds. Mood has been  stabilized while on Celexa 5mg  and Lorazepam. Regular BM while on MiraLax daily. Blood pressure is controlled on Lisinopril 10mg  daily. Asymptomatic of GERD. Multiple site osteoarthritis is chronic, Tylenol help to ease aches and pains.  Review of Systems:  Review of Systems  Constitutional: Negative for fever and chills.  HENT: Positive for hearing loss. Negative for congestion and ear discharge.   Eyes: Negative for pain and redness.  Respiratory: Negative for cough, shortness of breath and wheezing.   Cardiovascular: Negative for chest pain, palpitations and leg swelling.  Gastrointestinal: Negative for nausea, vomiting, abdominal pain, diarrhea and constipation.  Genitourinary: Positive for frequency. Negative for dysuria and urgency.       Incontinent of bladder  Musculoskeletal: Positive for back pain. Negative for myalgias and neck pain.  Skin: Negative for rash.       Dry skin .  Neurological: Negative for dizziness, tremors, seizures, weakness and headaches.  Psychiatric/Behavioral: Positive for memory loss. Negative for suicidal ideas and hallucinations. The patient is nervous/anxious.     Past Medical History  Diagnosis Date  . Malignant neoplasm of colon, unspecified site   . Anxiety state, unspecified   . Alzheimer's disease   . Unspecified essential hypertension   . Chronic airway obstruction, not elsewhere classified   . Barrett's esophagus   . Osteoarthrosis, unspecified whether generalized or  localized, unspecified site   . Osteoporosis, unspecified   . Shortness of breath   . Flatulence, eructation, and gas pain   . Urinary frequency   . Hyperglycemia   . Pressure ulcer, heel(707.07)   . Pneumonia, organism unspecified 02/24/2013  . Hypotension, unspecified 02/21/2013  . Hemorrhage of gastrointestinal tract, unspecified 02/21/2013  . Altered mental status 02/21/2013  . Unspecified fall 06/13/2012  . Joint pain of lower extremity 04/15/2012  . Unspecified vitamin D deficiency 05/07/2010  . Altered mental status 04/11/2010  . Closed fracture of distal end of ulna (alone) 04/12  . Disorders of bursae and tendons in shoulder region, unspecified 06/21/2009  . Pain in joint, pelvic region and thigh 04/12/2009  . Anemia, unspecified 04/05/2009  . Hip joint replacement by other means 03/23/2009  . Unspecified constipation 10/09/2008  . Aortic aneurysm of unspecified site without mention of rupture 07/28/2005  . Osteoarthrosis, unspecified whether generalized or localized, unspecified site 07/28/2005  . Osteoporosis, unspecified 07/28/2005  . Other emphysema ([place]) 07/29/1999  . Raynaud's syndrome 07/28/1998    Patient Active Problem List   Diagnosis Date Noted  . Seasonal allergies 08/27/2014  . Fall at nursing home 03/19/2014  . 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 Malignant neoplasm of colon, unspecified site   . Alzheimer's disease   . Essential hypertension   . Chronic airway obstruction, not elsewhere classified   . Barrett's esophagus   . Osteoarthritis   . Pain in joint, shoulder region   . Osteoporosis   . GERD (gastroesophageal reflux disease) 05/03/2013  . Anxiety state 03/24/2013  . Constipation 03/24/2013  Allergies  Allergen Reactions  . Morphine And Related     Per MAR  . Oxytrol [Oxybutynin]     Per MAR    Medications: Patient's Medications  New Prescriptions   No medications on file  Previous Medications   ACETAMINOPHEN (TYLENOL) 325 MG TABLET     Take 650 mg by mouth 2 (two) times daily. *Also takes 650mg  every 6 hours as needed*   CALCIUM-VITAMIN D-VITAMIN K (VIACTIV) [id number] MG-UNT-MCG CHEW    Chew 1 tablet by mouth daily.   CITALOPRAM (CELEXA) 10 MG TABLET    Take 5 mg by mouth daily. Take /12 daily to help nerves and depression.   ERGOCALCIFEROL (VITAMIN D2) 50000 UNITS CAPSULE    Take 50,000 Units by mouth every 30 (thirty) days. *Takes on the 17th of each month*   LACTOSE FREE NUTRITION (BOOST) LIQD    Take 237 mLs by mouth daily.   LISINOPRIL (PRINIVIL,ZESTRIL) 10 MG TABLET    Take 10 mg by mouth daily.   LORATADINE (CLARITIN) 10 MG TABLET    Take 10 mg by mouth daily.   LORAZEPAM (ATIVAN) 0.5 MG TABLET    Take 0.25 mg by mouth every morning. 0.25mg  bid prn for anxiety.   OMEPRAZOLE (PRILOSEC) 20 MG CAPSULE    Take 20 mg by mouth daily.    POLYETHYLENE GLYCOL (MIRALAX / GLYCOLAX) PACKET    Take 17 g by mouth daily.  Modified Medications   No medications on file  Discontinued Medications   No medications on file    Physical Exam: Filed Vitals:   12/05/15 1115  BP: 130/62  Pulse: 68  Temp: 98.5 F (36.9 C)  TempSrc: Tympanic  Resp: 18   There is no weight on file to calculate BMI.  Physical Exam  Constitutional: She appears well-developed and well-nourished.  HENT:  Head: Normocephalic and atraumatic.  Eyes: Conjunctivae and EOM are normal. Pupils are equal, round, and reactive to light. Right eye exhibits no discharge. Left eye exhibits no discharge.  Neck: Normal range of motion. Neck supple. No JVD present. No thyromegaly present.  Cardiovascular: Normal rate, regular rhythm and normal heart sounds.   No murmur heard. Pulmonary/Chest: Effort normal. She has no wheezes. She has rales (bibasilar dry rales appreciated).  Abdominal: Soft. Bowel sounds are normal. There is no tenderness.  Musculoskeletal: Normal range of motion. She exhibits no edema or tenderness.  Neurological: She is alert. No cranial nerve  deficit. She exhibits normal muscle tone. Coordination normal.  Skin: Skin is warm and dry. No rash noted.  On and off dry skin itching   Psychiatric: Her mood appears anxious. Her affect is labile. Her affect is not angry. Her speech is tangential. Her speech is not slurred. She is agitated (when assisted with peronal care) and combative (when she doesn't understad what you want her to do). She is not aggressive, not hyperactive and not withdrawn. Thought content is not paranoid and not delusional. Cognition and memory are impaired. She expresses impulsivity and inappropriate judgment. She does not exhibit a depressed mood. She exhibits abnormal recent memory and abnormal remote memory.    Labs reviewed: Basic Metabolic Panel: No results for input(s): NA, K, CL, CO2, GLUCOSE, BUN, CREATININE, CALCIUM, MG, PHOS in the last 8760 hours.  Liver Function Tests: No results for input(s): AST, ALT, ALKPHOS, BILITOT, PROT, ALBUMIN in the last 8760 hours.  CBC: No results for input(s): WBC, NEUTROABS, HGB, HCT, MCV, PLT in the last 8760 hours.  Lab Results  Component Value Date   TSH 0.74 03/20/2014   No results found for: HGBA1C No results found for: CHOL, HDL, LDLCALC, LDLDIRECT, TRIG, CHOLHDL  Significant Diagnostic Results since last visit: none  Patient Care Team: Estill Dooms, MD as PCP - General (Internal Medicine) Jacobie Stamey Tarnisha Kachmar X, NP as Nurse Practitioner (Nurse Practitioner)  Assessment/Plan Problem List Items Addressed This Visit    Alzheimer's disease - Primary (Chronic)    End stage, off memory preserving meds, total care of ADLs      Anxiety state    Stable, continue Celexa 5mg        Constipation    Stable, continue MiraLax daily      Essential hypertension (Chronic)    Controlled, continue Lisinopril 10mg       GERD (gastroesophageal reflux disease)    Asymptomatic       Osteoarthritis    Multiple sites, continueTylenol 650mg  bid and Prn             Family/ staff Communication: continue SNF for care needs.   Labs/tests ordered: none  ManXie Karrigan Messamore NP [facility] [address], [place] [address] On Call:  [phone] & follow prompts after 5pm & weekends Office Phone:  [phone] Office Fax:  [phone]

## 2015-12-05 NOTE — Assessment & Plan Note (Signed)
Asymptomatic. 

## 2016-01-02 ENCOUNTER — Non-Acute Institutional Stay (SKILLED_NURSING_FACILITY): Payer: 59 | Admitting: Nurse Practitioner

## 2016-01-02 ENCOUNTER — Encounter: Payer: Self-pay | Admitting: Nurse Practitioner

## 2016-01-02 DIAGNOSIS — K5900 Constipation, unspecified: Secondary | ICD-10-CM | POA: Diagnosis not present

## 2016-01-02 DIAGNOSIS — F411 Generalized anxiety disorder: Secondary | ICD-10-CM

## 2016-01-02 DIAGNOSIS — K219 Gastro-esophageal reflux disease without esophagitis: Secondary | ICD-10-CM

## 2016-01-02 DIAGNOSIS — M150 Primary generalized (osteo)arthritis: Secondary | ICD-10-CM

## 2016-01-02 DIAGNOSIS — I10 Essential (primary) hypertension: Secondary | ICD-10-CM | POA: Diagnosis not present

## 2016-01-02 DIAGNOSIS — G308 Other Alzheimer's disease: Secondary | ICD-10-CM

## 2016-01-02 DIAGNOSIS — F0280 Dementia in other diseases classified elsewhere without behavioral disturbance: Secondary | ICD-10-CM

## 2016-01-02 DIAGNOSIS — M159 Polyosteoarthritis, unspecified: Secondary | ICD-10-CM

## 2016-01-02 NOTE — Assessment & Plan Note (Signed)
Stable, continue Celexa 5mg  

## 2016-01-02 NOTE — Assessment & Plan Note (Signed)
Asymptomatic. 

## 2016-01-02 NOTE — Assessment & Plan Note (Signed)
Multiple sites, continueTylenol 650mg bid and Prn 

## 2016-01-02 NOTE — Progress Notes (Signed)
Patient ID: [name], female   DOB: 03/19/1917, 82 y.o.   MRN: [id number]  Location:  SNF [facility] Provider:  Marlana Latus NP  Code Status:  DNR Goals of care: Advanced Directive information    Chief Complaint  Patient presents with  . Medical Management of Chronic Issues     HPI: Patient is a 82 y.o. female seen in the SNF at [facility] today for evaluation of Hx of Alzheimer's dementia, end stage, total care, off memory preserving meds. Mood has been  stabilized while on Celexa 5mg  and Lorazepam. Regular BM while on MiraLax daily. Blood pressure is controlled on Lisinopril 10mg  daily. Asymptomatic of GERD. Multiple site osteoarthritis is chronic, Tylenol help to ease aches and pains.  Review of Systems  Constitutional: Negative for fever and chills.  HENT: Positive for hearing loss. Negative for congestion and ear discharge.   Eyes: Negative for pain and redness.  Respiratory: Negative for cough, shortness of breath and wheezing.   Cardiovascular: Negative for chest pain, palpitations and leg swelling.  Gastrointestinal: Negative for nausea, vomiting, abdominal pain, diarrhea and constipation.  Genitourinary: Positive for frequency. Negative for dysuria and urgency.       Incontinent of bladder  Musculoskeletal: Positive for back pain. Negative for myalgias and neck pain.  Skin: Negative for rash.       Dry skin .  Neurological: Negative for dizziness, tremors, seizures, weakness and headaches.  Psychiatric/Behavioral: Positive for memory loss. Negative for suicidal ideas and hallucinations. The patient is nervous/anxious.     Past Medical History  Diagnosis Date  . Malignant neoplasm of colon, unspecified site   . Anxiety state, unspecified   . Alzheimer's disease   . Unspecified essential hypertension   . Chronic airway obstruction, not elsewhere classified   . Barrett's esophagus   . Osteoarthrosis, unspecified whether generalized or localized, unspecified site     . Osteoporosis, unspecified   . Shortness of breath   . Flatulence, eructation, and gas pain   . Urinary frequency   . Hyperglycemia   . Pressure ulcer, heel(707.07)   . Pneumonia, organism unspecified 02/24/2013  . Hypotension, unspecified 02/21/2013  . Hemorrhage of gastrointestinal tract, unspecified 02/21/2013  . Altered mental status 02/21/2013  . Unspecified fall 06/13/2012  . Joint pain of lower extremity 04/15/2012  . Unspecified vitamin D deficiency 05/07/2010  . Altered mental status 04/11/2010  . Closed fracture of distal end of ulna (alone) 04/12  . Disorders of bursae and tendons in shoulder region, unspecified 06/21/2009  . Pain in joint, pelvic region and thigh 04/12/2009  . Anemia, unspecified 04/05/2009  . Hip joint replacement by other means 03/23/2009  . Unspecified constipation 10/09/2008  . Aortic aneurysm of unspecified site without mention of rupture 07/28/2005  . Osteoarthrosis, unspecified whether generalized or localized, unspecified site 07/28/2005  . Osteoporosis, unspecified 07/28/2005  . Other emphysema ([place]) 07/29/1999  . Raynaud's syndrome 07/28/1998    Patient Active Problem List   Diagnosis Date Noted  . Seasonal allergies 08/27/2014  . Fall at nursing home 03/19/2014  . 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 Malignant neoplasm of colon, unspecified site   . Alzheimer's disease   . Essential hypertension   . Chronic airway obstruction, not elsewhere classified   . Barrett's esophagus   . Osteoarthritis   . Pain in joint, shoulder region   . Osteoporosis   . GERD (gastroesophageal reflux disease) 05/03/2013  . Anxiety state 03/24/2013  . Constipation 03/24/2013    Allergies  Allergen  Reactions  . Morphine And Related     Per MAR  . Oxytrol [Oxybutynin]     Per MAR    Medications: Patient's Medications  New Prescriptions   No medications on file  Previous Medications   ACETAMINOPHEN (TYLENOL) 325 MG TABLET    Take 650 mg by mouth 2  (two) times daily. *Also takes 650mg  every 6 hours as needed*   CALCIUM-VITAMIN D-VITAMIN K (VIACTIV) [id number] MG-UNT-MCG CHEW    Chew 1 tablet by mouth daily.   CITALOPRAM (CELEXA) 10 MG TABLET    Take 5 mg by mouth daily. Take /12 daily to help nerves and depression.   ERGOCALCIFEROL (VITAMIN D2) 50000 UNITS CAPSULE    Take 50,000 Units by mouth every 30 (thirty) days. *Takes on the 17th of each month*   LACTOSE FREE NUTRITION (BOOST) LIQD    Take 237 mLs by mouth daily.   LISINOPRIL (PRINIVIL,ZESTRIL) 10 MG TABLET    Take 10 mg by mouth daily.   LORATADINE (CLARITIN) 10 MG TABLET    Take 10 mg by mouth daily.   LORAZEPAM (ATIVAN) 0.5 MG TABLET    Take 0.25 mg by mouth every morning. 0.25mg  bid prn for anxiety.   OMEPRAZOLE (PRILOSEC) 20 MG CAPSULE    Take 20 mg by mouth daily.    POLYETHYLENE GLYCOL (MIRALAX / GLYCOLAX) PACKET    Take 17 g by mouth daily.  Modified Medications   No medications on file  Discontinued Medications   No medications on file    Physical Exam: Filed Vitals:   01/02/16 1310  BP: 120/62  Pulse: 58  Temp: 98.2 F (36.8 C)  TempSrc: Tympanic  Resp: 16   There is no weight on file to calculate BMI.  Physical Exam  Constitutional: She appears well-developed and well-nourished.  HENT:  Head: Normocephalic and atraumatic.  Eyes: Conjunctivae and EOM are normal. Pupils are equal, round, and reactive to light. Right eye exhibits no discharge. Left eye exhibits no discharge.  Neck: Normal range of motion. Neck supple. No JVD present. No thyromegaly present.  Cardiovascular: Normal rate, regular rhythm and normal heart sounds.   No murmur heard. Pulmonary/Chest: Effort normal. She has no wheezes. She has rales (bibasilar dry rales appreciated).  Abdominal: Soft. Bowel sounds are normal. There is no tenderness.  Musculoskeletal: Normal range of motion. She exhibits no edema or tenderness.  Neurological: She is alert. No cranial nerve deficit. She exhibits  normal muscle tone. Coordination normal.  Skin: Skin is warm and dry. No rash noted.  On and off dry skin itching   Psychiatric: Her mood appears anxious. Her affect is labile. Her affect is not angry. Her speech is tangential. Her speech is not slurred. She is agitated (when assisted with peronal care) and combative (when she doesn't understad what you want her to do). She is not aggressive, not hyperactive and not withdrawn. Thought content is not paranoid and not delusional. Cognition and memory are impaired. She expresses impulsivity and inappropriate judgment. She does not exhibit a depressed mood. She exhibits abnormal recent memory and abnormal remote memory.    Labs reviewed: Basic Metabolic Panel: No results for input(s): NA, K, CL, CO2, GLUCOSE, BUN, CREATININE, CALCIUM, MG, PHOS in the last 8760 hours.  Liver Function Tests: No results for input(s): AST, ALT, ALKPHOS, BILITOT, PROT, ALBUMIN in the last 8760 hours.  CBC: No results for input(s): WBC, NEUTROABS, HGB, HCT, MCV, PLT in the last 8760 hours.  Lab Results  Component Value Date  TSH 0.74 03/20/2014   No results found for: HGBA1C No results found for: CHOL, HDL, LDLCALC, LDLDIRECT, TRIG, CHOLHDL  Significant Diagnostic Results since last visit: none  Patient Care Team: Estill Dooms, MD as PCP - General (Internal Medicine) [place] Khamya Topp X, NP as Nurse Practitioner (Nurse Practitioner)  Assessment/Plan Problem List Items Addressed This Visit    Osteoarthritis    Multiple sites, continueTylenol 650mg  bid and Prn      GERD (gastroesophageal reflux disease)    Asymptomatic       Essential hypertension (Chronic)    Controlled, continue Lisinopril 10mg       Constipation    Stable, continue MiraLax daily      Anxiety state    Stable, continue Celexa 5mg        Alzheimer's disease - Primary (Chronic)    End stage, off memory preserving meds, total care of ADLs          Family/  staff Communication: continue SNF for care needs.   Labs/tests ordered: none  ManXie Bettyjo Lundblad NP [facility] [address], [place] [address] On Call:  [phone] & follow prompts after 5pm & weekends Office Phone:  [phone] Office Fax:  [phone]

## 2016-01-02 NOTE — Assessment & Plan Note (Signed)
End stage, off memory preserving meds, total care of ADLs 

## 2016-01-02 NOTE — Assessment & Plan Note (Signed)
Controlled, continue Lisinopril 10mg 

## 2016-01-02 NOTE — Assessment & Plan Note (Signed)
Stable, continue MiraLax daily.  

## 2016-01-29 ENCOUNTER — Encounter: Payer: Self-pay | Admitting: Nurse Practitioner

## 2016-01-29 ENCOUNTER — Non-Acute Institutional Stay (SKILLED_NURSING_FACILITY): Payer: 59 | Admitting: Nurse Practitioner

## 2016-01-29 DIAGNOSIS — G308 Other Alzheimer's disease: Secondary | ICD-10-CM | POA: Diagnosis not present

## 2016-01-29 DIAGNOSIS — M150 Primary generalized (osteo)arthritis: Secondary | ICD-10-CM

## 2016-01-29 DIAGNOSIS — F0280 Dementia in other diseases classified elsewhere without behavioral disturbance: Secondary | ICD-10-CM | POA: Diagnosis not present

## 2016-01-29 DIAGNOSIS — F411 Generalized anxiety disorder: Secondary | ICD-10-CM | POA: Diagnosis not present

## 2016-01-29 DIAGNOSIS — I10 Essential (primary) hypertension: Secondary | ICD-10-CM | POA: Diagnosis not present

## 2016-01-29 DIAGNOSIS — K219 Gastro-esophageal reflux disease without esophagitis: Secondary | ICD-10-CM

## 2016-01-29 DIAGNOSIS — K5900 Constipation, unspecified: Secondary | ICD-10-CM

## 2016-01-29 DIAGNOSIS — M159 Polyosteoarthritis, unspecified: Secondary | ICD-10-CM

## 2016-01-29 NOTE — Progress Notes (Signed)
Patient ID: [name], female   DOB: 07-12-1917, 82 y.o.   MRN: [id number]  Location:  SNF [facility] Provider:  Marlana Latus NP  Code Status:  DNR Goals of care: Advanced Directive information Does patient have an advance directive?: Yes, Type of Advance Directive: Out of facility DNR (pink MOST or yellow form)  Chief Complaint  Patient presents with  . Medical Management of Chronic Issues    Routine Visit     HPI: Patient is a 82 y.o. female seen in the SNF at [facility] today for evaluation of Hx of Alzheimer's dementia, end stage, total care, off memory preserving meds. Mood has been  stabilized while on Celexa 5mg  and Lorazepam. Regular BM while on MiraLax daily. Blood pressure is controlled on Lisinopril 10mg  daily. Asymptomatic of GERD. Multiple site osteoarthritis is chronic, Tylenol help to ease aches and pains.  Review of Systems  Constitutional: Negative for fever and chills.  HENT: Positive for hearing loss. Negative for congestion and ear discharge.   Eyes: Negative for pain and redness.  Respiratory: Negative for cough, shortness of breath and wheezing.   Cardiovascular: Negative for chest pain, palpitations and leg swelling.  Gastrointestinal: Negative for nausea, vomiting, abdominal pain, diarrhea and constipation.  Genitourinary: Positive for frequency. Negative for dysuria and urgency.       Incontinent of bladder  Musculoskeletal: Positive for back pain. Negative for myalgias and neck pain.  Skin: Negative for rash.       Dry skin .  Neurological: Negative for dizziness, tremors, seizures, weakness and headaches.  Psychiatric/Behavioral: Positive for memory loss. Negative for suicidal ideas and hallucinations. The patient is nervous/anxious.     Past Medical History  Diagnosis Date  . Malignant neoplasm of colon, unspecified site   . Anxiety state, unspecified   . Alzheimer's disease   . Unspecified essential hypertension   . Chronic airway obstruction,  not elsewhere classified   . Barrett's esophagus   . Osteoarthrosis, unspecified whether generalized or localized, unspecified site   . Osteoporosis, unspecified   . Shortness of breath   . Flatulence, eructation, and gas pain   . Urinary frequency   . Hyperglycemia   . Pressure ulcer, heel(707.07)   . Pneumonia, organism unspecified 02/24/2013  . Hypotension, unspecified 02/21/2013  . Hemorrhage of gastrointestinal tract, unspecified 02/21/2013  . Altered mental status 02/21/2013  . Unspecified fall 06/13/2012  . Joint pain of lower extremity 04/15/2012  . Unspecified vitamin D deficiency 05/07/2010  . Altered mental status 04/11/2010  . Closed fracture of distal end of ulna (alone) 04/12  . Disorders of bursae and tendons in shoulder region, unspecified 06/21/2009  . Pain in joint, pelvic region and thigh 04/12/2009  . Anemia, unspecified 04/05/2009  . Hip joint replacement by other means 03/23/2009  . Unspecified constipation 10/09/2008  . Aortic aneurysm of unspecified site without mention of rupture 07/28/2005  . Osteoarthrosis, unspecified whether generalized or localized, unspecified site 07/28/2005  . Osteoporosis, unspecified 07/28/2005  . Other emphysema ([place]) 07/29/1999  . Raynaud's syndrome 07/28/1998    Patient Active Problem List   Diagnosis Date Noted  . Seasonal allergies 08/27/2014  . Fall at nursing home 03/19/2014  . 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 Malignant neoplasm of colon, unspecified site   . Alzheimer's disease   . Essential hypertension   . Chronic airway obstruction, not elsewhere classified   . Barrett's esophagus   . Osteoarthritis   . Pain in joint, shoulder region   . Osteoporosis   .  GERD (gastroesophageal reflux disease) 05/03/2013  . Anxiety state 03/24/2013  . Constipation 03/24/2013    Allergies  Allergen Reactions  . Morphine And Related     Per MAR  . Oxytrol [Oxybutynin]     Per MAR    Medications: Patient's Medications  New  Prescriptions   No medications on file  Previous Medications   ACETAMINOPHEN (TYLENOL) 325 MG TABLET    Take 650 mg by mouth 2 (two) times daily. *Also takes 650mg  every 6 hours as needed*   CITALOPRAM (CELEXA) 10 MG TABLET    Take 5 mg by mouth daily. Take /12 daily to help nerves and depression.   ERGOCALCIFEROL (VITAMIN D2) 50000 UNITS CAPSULE    Take 50,000 Units by mouth every 30 (thirty) days. *Takes on the 19th of each month*   LACTOSE FREE NUTRITION (BOOST) LIQD    Take 237 mLs by mouth daily.   LISINOPRIL (PRINIVIL,ZESTRIL) 10 MG TABLET    Take 10 mg by mouth daily.   LORATADINE (CLARITIN) 10 MG TABLET    Take 10 mg by mouth daily.   LORAZEPAM (ATIVAN) 0.5 MG TABLET    Take 0.25 mg by mouth every morning. 0.25mg  bid prn for anxiety.   OMEPRAZOLE (PRILOSEC) 20 MG CAPSULE    Take 20 mg by mouth daily.    POLYETHYLENE GLYCOL (MIRALAX / GLYCOLAX) PACKET    Take 17 g by mouth daily.   POLYVINYL ALCOHOL (LIQUIFILM TEARS) 1.4 % OPHTHALMIC SOLUTION    Place 1 drop into both eyes 3 (three) times daily.  Modified Medications   No medications on file  Discontinued Medications   CALCIUM-VITAMIN D-VITAMIN K (VIACTIV) 500-500-40 MG-UNT-MCG CHEW    Chew 1 tablet by mouth daily.    Physical Exam: Filed Vitals:   01/29/16 1543  BP: 118/70  Pulse: 72  Temp: 97.7 F (36.5 C)  TempSrc: Oral  Resp: 20  Height: 5\' 3"  (1.6 m)  Weight: 135 lb 8 oz (61.462 kg)   Body mass index is 24.01 kg/(m^2).  Physical Exam  Constitutional: She appears well-developed and well-nourished.  HENT:  Head: Normocephalic and atraumatic.  Eyes: Conjunctivae and EOM are normal. Pupils are equal, round, and reactive to light. Right eye exhibits no discharge. Left eye exhibits no discharge.  Neck: Normal range of motion. Neck supple. No JVD present. No thyromegaly present.  Cardiovascular: Normal rate, regular rhythm and normal heart sounds.   No murmur heard. Pulmonary/Chest: Effort normal. She has no wheezes. She  has rales (bibasilar dry rales appreciated).  Abdominal: Soft. Bowel sounds are normal. There is no tenderness.  Musculoskeletal: Normal range of motion. She exhibits no edema or tenderness.  Neurological: She is alert. No cranial nerve deficit. She exhibits normal muscle tone. Coordination normal.  Skin: Skin is warm and dry. No rash noted.  On and off dry skin itching   Psychiatric: Her mood appears anxious. Her affect is labile. Her affect is not angry. Her speech is tangential. Her speech is not slurred. She is agitated (when assisted with peronal care) and combative (when she doesn't understad what you want her to do). She is not aggressive, not hyperactive and not withdrawn. Thought content is not paranoid and not delusional. Cognition and memory are impaired. She expresses impulsivity and inappropriate judgment. She does not exhibit a depressed mood. She exhibits abnormal recent memory and abnormal remote memory.    Labs reviewed: Basic Metabolic Panel: No results for input(s): NA, K, CL, CO2, GLUCOSE, BUN, CREATININE, CALCIUM, MG, PHOS  in the last 8760 hours.  Liver Function Tests: No results for input(s): AST, ALT, ALKPHOS, BILITOT, PROT, ALBUMIN in the last 8760 hours.  CBC: No results for input(s): WBC, NEUTROABS, HGB, HCT, MCV, PLT in the last 8760 hours.  Lab Results  Component Value Date   TSH 0.74 03/20/2014   No results found for: HGBA1C No results found for: CHOL, HDL, LDLCALC, LDLDIRECT, TRIG, CHOLHDL  Significant Diagnostic Results since last visit: none  Patient Care Team: Estill Dooms, MD as PCP - General (Internal Medicine) [place] Man Mast X, NP as Nurse Practitioner (Nurse Practitioner)  Assessment/Plan Problem List Items Addressed This Visit    Alzheimer's disease - Primary (Chronic)    End stage, off memory preserving meds, total care of ADLs      Essential hypertension (Chronic)    Controlled, continue Lisinopril 10mg       Anxiety  state    Stable, continue Celexa 5mg        Constipation    Stable, continue MiraLax daily      GERD (gastroesophageal reflux disease)    Asymptomatic       Osteoarthritis    Multiple sites, continueTylenol 650mg  bid and Prn          Family/ staff Communication: continue SNF for care needs.   Labs/tests ordered: none  ManXie Mast NP [facility] [address], [place] [address] On Call:  [phone] & follow prompts after 5pm & weekends Office Phone:  [phone] Office Fax:  [phone]

## 2016-01-29 NOTE — Assessment & Plan Note (Signed)
Stable, continue MiraLax daily.  

## 2016-01-29 NOTE — Assessment & Plan Note (Signed)
Controlled, continue Lisinopril 10mg 

## 2016-01-29 NOTE — Progress Notes (Signed)
Patient ID: [name], female   DOB: [birth year], 82 y.o.   MRN: [id number]

## 2016-01-29 NOTE — Assessment & Plan Note (Signed)
Asymptomatic. 

## 2016-01-29 NOTE — Assessment & Plan Note (Signed)
End stage, off memory preserving meds, total care of ADLs 

## 2016-01-29 NOTE — Assessment & Plan Note (Signed)
Multiple sites, continueTylenol 650mg bid and Prn 

## 2016-01-29 NOTE — Assessment & Plan Note (Signed)
Stable, continue Celexa 5mg  

## 2016-02-26 ENCOUNTER — Encounter: Payer: Self-pay | Admitting: Nurse Practitioner

## 2016-02-26 ENCOUNTER — Non-Acute Institutional Stay (SKILLED_NURSING_FACILITY): Payer: 59 | Admitting: Nurse Practitioner

## 2016-02-26 DIAGNOSIS — I10 Essential (primary) hypertension: Secondary | ICD-10-CM

## 2016-02-26 DIAGNOSIS — F411 Generalized anxiety disorder: Secondary | ICD-10-CM

## 2016-02-26 DIAGNOSIS — K5900 Constipation, unspecified: Secondary | ICD-10-CM | POA: Diagnosis not present

## 2016-02-26 DIAGNOSIS — M150 Primary generalized (osteo)arthritis: Secondary | ICD-10-CM

## 2016-02-26 DIAGNOSIS — F0280 Dementia in other diseases classified elsewhere without behavioral disturbance: Secondary | ICD-10-CM | POA: Diagnosis not present

## 2016-02-26 DIAGNOSIS — K219 Gastro-esophageal reflux disease without esophagitis: Secondary | ICD-10-CM | POA: Diagnosis not present

## 2016-02-26 DIAGNOSIS — G308 Other Alzheimer's disease: Secondary | ICD-10-CM | POA: Diagnosis not present

## 2016-02-26 DIAGNOSIS — M159 Polyosteoarthritis, unspecified: Secondary | ICD-10-CM

## 2016-02-26 NOTE — Assessment & Plan Note (Signed)
Controlled, continue Lisinopril 10mg 

## 2016-02-26 NOTE — Assessment & Plan Note (Signed)
End stage, off memory preserving meds, total care of ADLs 

## 2016-02-26 NOTE — Assessment & Plan Note (Signed)
Stable, continue MiraLax daily.  

## 2016-02-26 NOTE — Assessment & Plan Note (Signed)
Stable, continue Celexa 5mg  

## 2016-02-26 NOTE — Assessment & Plan Note (Signed)
Multiple sites, continueTylenol 650mg bid and Prn 

## 2016-02-26 NOTE — Assessment & Plan Note (Signed)
Asymptomatic. 

## 2016-02-26 NOTE — Progress Notes (Signed)
Patient ID: [name], female   DOB: 1917-12-28, 82 y.o.   MRN: [id number]  Location:  [facility]   Place of Service:  SNF (31) Provider: Lennie Odor Garnett Nunziata NP  GREEN, [name], MD  Patient Care Team: Estill Dooms, MD as PCP - General (Internal Medicine) [place] Elmus Mathes X, NP as Nurse Practitioner (Nurse Practitioner)  Extended Emergency Contact Information Primary Emergency Contact: [facility] Address: [address]          [place] Home Phone: [phone] Relation: None  Code Status:  DNR Goals of care: Advanced Directive information Advanced Directives 02/17/2016  Does patient have an advance directive? Yes  Type of Advance Directive Living will;Out of facility DNR (pink MOST or yellow form)  Does patient want to make changes to advanced directive? No - Patient declined  Copy of advanced directive(s) in chart? Yes     Chief Complaint  Patient presents with  . Medical Management of Chronic Issues    HPI:  Pt is a 82 y.o. female seen today for medical management of chronic diseases.   Hx of Alzheimer's dementia, end stage, total care, off memory preserving meds. Mood has been  stabilized while on Celexa 5mg  and Lorazepam. Regular BM while on MiraLax daily. Blood pressure is controlled on Lisinopril 10mg  daily. Asymptomatic of GERD. Multiple site osteoarthritis is chronic, Tylenol help to ease aches and pains.   Past Medical History  Diagnosis Date  . Malignant neoplasm of colon, unspecified site   . Anxiety state, unspecified   . Alzheimer's disease   . Unspecified essential hypertension   . Chronic airway obstruction, not elsewhere classified   . Barrett's esophagus   . Osteoarthrosis, unspecified whether generalized or localized, unspecified site   . Osteoporosis, unspecified   . Shortness of breath   . Flatulence, eructation, and gas pain   . Urinary frequency   . Hyperglycemia   . Pressure ulcer, heel(707.07)   .  Pneumonia, organism unspecified 02/24/2013  . Hypotension, unspecified 02/21/2013  . Hemorrhage of gastrointestinal tract, unspecified 02/21/2013  . Altered mental status 02/21/2013  . Unspecified fall 06/13/2012  . Joint pain of lower extremity 04/15/2012  . Unspecified vitamin D deficiency 05/07/2010  . Altered mental status 04/11/2010  . Closed fracture of distal end of ulna (alone) 04/12  . Disorders of bursae and tendons in shoulder region, unspecified 06/21/2009  . Pain in joint, pelvic region and thigh 04/12/2009  . Anemia, unspecified 04/05/2009  . Hip joint replacement by other means 03/23/2009  . Unspecified constipation 10/09/2008  . Aortic aneurysm of unspecified site without mention of rupture 07/28/2005  . Osteoarthrosis, unspecified whether generalized or localized, unspecified site 07/28/2005  . Osteoporosis, unspecified 07/28/2005  . Other emphysema ([place]) 07/29/1999  . Raynaud's syndrome 07/28/1998   Past Surgical History  Procedure Laterality Date  . Dilation and curettage of uterus    . Colon surgery  2001    partial colon resecton for cancer  . Cataract extraction w/ intraocular lens  implant, bilateral  2004  . Total hip arthroplasty Left 2010    Allergies  Allergen Reactions  . Morphine And Related     Per MAR  . Oxytrol [Oxybutynin]     Per [facility]      Medication List       This list is accurate as of: 02/26/16  5:00 PM.  Always use your most recent med list.  acetaminophen 325 MG tablet  Commonly known as:  TYLENOL  Take 650 mg by mouth 2 (two) times daily. *Also takes 650mg  every 6 hours as needed*     citalopram 10 MG tablet  Commonly known as:  CELEXA  Take 5 mg by mouth daily. Take /12 daily to help nerves and depression.     ergocalciferol 50000 units capsule  Commonly known as:  VITAMIN D2  Take 50,000 Units by mouth every 30 (thirty) days. *Takes on the 19th of each month*     lactose free nutrition Liqd  Take 237 mLs  by mouth daily.     lisinopril 10 MG tablet  Commonly known as:  PRINIVIL,ZESTRIL  Take 10 mg by mouth daily.     loratadine 10 MG tablet  Commonly known as:  CLARITIN  Take 10 mg by mouth daily.     LORazepam 0.5 MG tablet  Commonly known as:  ATIVAN  Take 0.25 mg by mouth every morning. 0.25mg  bid prn for anxiety.     omeprazole 20 MG capsule  Commonly known as:  PRILOSEC  Take 20 mg by mouth daily.     polyethylene glycol packet  Commonly known as:  MIRALAX / GLYCOLAX  Take 17 g by mouth daily.     polyvinyl alcohol 1.4 % ophthalmic solution  Commonly known as:  LIQUIFILM TEARS  Place 1 drop into both eyes 3 (three) times daily.        Review of Systems  Constitutional: Negative for fever and chills.  HENT: Positive for hearing loss. Negative for congestion and ear discharge.   Eyes: Negative for pain and redness.  Respiratory: Negative for cough, shortness of breath and wheezing.   Cardiovascular: Negative for chest pain, palpitations and leg swelling.  Gastrointestinal: Negative for nausea, vomiting, abdominal pain, diarrhea and constipation.  Genitourinary: Positive for frequency. Negative for dysuria and urgency.       Incontinent of bladder  Musculoskeletal: Positive for back pain. Negative for myalgias and neck pain.  Skin: Negative for rash.       Dry skin .  Neurological: Negative for dizziness, tremors, seizures, weakness and headaches.  Psychiatric/Behavioral: Negative for suicidal ideas and hallucinations. The patient is nervous/anxious.     Immunization History  Administered Date(s) Administered  . Influenza Whole 10/24/2012  . Influenza-Unspecified 10/31/2014, 09/18/2015  . Pneumococcal Polysaccharide-23 12/29/2003  . Td 12/28/2006  . Tdap 03/13/2014   Pertinent  Health Maintenance Due  Topic Date Due  . DEXA SCAN  02/09/1984  . PNA vac Low Risk Adult (2 of 2 - PCV13) 12/28/2004  . INFLUENZA VACCINE  07/28/2016   Fall Risk  05/02/2015  Falls  in the past year? No  Risk for fall due to : Impaired balance/gait;Impaired mobility   Functional Status Survey:    Filed Vitals:   02/26/16 1659  BP: 128/68  Pulse: 72  Temp: 97.6 F (36.4 C)  TempSrc: Tympanic  Resp: 18   There is no weight on file to calculate BMI. Physical Exam  Constitutional: She appears well-developed and well-nourished.  HENT:  Head: Normocephalic and atraumatic.  Eyes: Conjunctivae and EOM are normal. Pupils are equal, round, and reactive to light. Right eye exhibits no discharge. Left eye exhibits no discharge.  Neck: Normal range of motion. Neck supple. No JVD present. No thyromegaly present.  Cardiovascular: Normal rate, regular rhythm and normal heart sounds.   No murmur heard. Pulmonary/Chest: Effort normal. She has no wheezes. She has rales (bibasilar dry rales  appreciated).  Abdominal: Soft. Bowel sounds are normal. There is no tenderness.  Musculoskeletal: Normal range of motion. She exhibits no edema or tenderness.  Neurological: She is alert. No cranial nerve deficit. She exhibits normal muscle tone. Coordination normal.  Skin: Skin is warm and dry. No rash noted.  On and off dry skin itching   Psychiatric: Her mood appears anxious. Her affect is labile. Her affect is not angry. Her speech is tangential. Her speech is not slurred. She is agitated (when assisted with peronal care) and combative (when she doesn't understad what you want her to do). She is not aggressive, not hyperactive and not withdrawn. Thought content is not paranoid and not delusional. Cognition and memory are impaired. She expresses impulsivity and inappropriate judgment. She does not exhibit a depressed mood. She exhibits abnormal recent memory and abnormal remote memory.    Labs reviewed: No results for input(s): NA, K, CL, CO2, GLUCOSE, BUN, CREATININE, CALCIUM, MG, PHOS in the last 8760 hours. No results for input(s): AST, ALT, ALKPHOS, BILITOT, PROT, ALBUMIN in the last  8760 hours. No results for input(s): WBC, NEUTROABS, HGB, HCT, MCV, PLT in the last 8760 hours. Lab Results  Component Value Date   TSH 0.74 03/20/2014   No results found for: HGBA1C No results found for: CHOL, HDL, LDLCALC, LDLDIRECT, TRIG, CHOLHDL  Significant Diagnostic Results in last 30 days:  No results found.  Assessment/Plan  Alzheimer's disease End stage, off memory preserving meds, total care of ADLs  Essential hypertension Controlled, continue Lisinopril 10mg   Anxiety state Stable, continue Celexa 5mg    Constipation Stable, continue MiraLax daily  GERD (gastroesophageal reflux disease) Asymptomatic   Osteoarthritis Multiple sites, continueTylenol 650mg  bid and Prn    Family/ staff Communication: continue SNF for care needs  Labs/tests ordered:  none

## 2016-03-30 ENCOUNTER — Non-Acute Institutional Stay (SKILLED_NURSING_FACILITY): Payer: 59 | Admitting: Nurse Practitioner

## 2016-03-30 ENCOUNTER — Encounter: Payer: Self-pay | Admitting: Nurse Practitioner

## 2016-03-30 DIAGNOSIS — K5900 Constipation, unspecified: Secondary | ICD-10-CM

## 2016-03-30 DIAGNOSIS — R627 Adult failure to thrive: Secondary | ICD-10-CM | POA: Diagnosis not present

## 2016-03-30 DIAGNOSIS — M159 Polyosteoarthritis, unspecified: Secondary | ICD-10-CM

## 2016-03-30 DIAGNOSIS — F0280 Dementia in other diseases classified elsewhere without behavioral disturbance: Secondary | ICD-10-CM

## 2016-03-30 DIAGNOSIS — K219 Gastro-esophageal reflux disease without esophagitis: Secondary | ICD-10-CM | POA: Diagnosis not present

## 2016-03-30 DIAGNOSIS — G308 Other Alzheimer's disease: Secondary | ICD-10-CM | POA: Diagnosis not present

## 2016-03-30 DIAGNOSIS — F411 Generalized anxiety disorder: Secondary | ICD-10-CM | POA: Diagnosis not present

## 2016-03-30 DIAGNOSIS — I10 Essential (primary) hypertension: Secondary | ICD-10-CM

## 2016-03-30 DIAGNOSIS — M150 Primary generalized (osteo)arthritis: Secondary | ICD-10-CM

## 2016-03-30 NOTE — Assessment & Plan Note (Signed)
Multiple sites, continueTylenol 650mg bid and Prn 

## 2016-03-30 NOTE — Assessment & Plan Note (Signed)
Stable, continue MiraLax daily.  

## 2016-03-30 NOTE — Assessment & Plan Note (Signed)
Stable, continue Celexa 5mg  

## 2016-03-30 NOTE — Assessment & Plan Note (Signed)
Asymptomatic, continue Omeprazole 20mg daily.  

## 2016-03-30 NOTE — Assessment & Plan Note (Signed)
Controlled, continue Lisinopril 10mg 

## 2016-03-30 NOTE — Progress Notes (Signed)
Patient ID: [name], female   DOB: Dec 29, 1916, 82 y.o.   MRN: [id number]  Location:  [place] Room Number: 16 Place of Service:  SNF (31) Provider: Lennie Odor Mast NP  GREEN, [name], MD  Patient Care Team: Estill Dooms, MD as PCP - General (Internal Medicine) [place] Mast X, NP as Nurse Practitioner (Nurse Practitioner)  Extended Emergency Contact Information Primary Emergency Contact: [facility] Address: [address]          [place],  [place] Home Phone: [phone] Relation: None  Code Status:  DNR Goals of care: Advanced Directive information Advanced Directives 03/30/2016  Does patient have an advance directive? Yes  Type of Advance Directive Living will;Out of facility DNR (pink MOST or yellow form)  Does patient want to make changes to advanced directive? No - Patient declined  Copy of advanced directive(s) in chart? Yes     Chief Complaint  Patient presents with  . Medical Management of Chronic Issues    Routine Visit    HPI:  Pt is a 82 y.o. female seen today for medical management of chronic diseases.   Hx of Alzheimer's dementia, end stage, total care, off memory preserving meds. Mood has been  stabilized while on Celexa 5mg  and Lorazepam. Regular BM while on MiraLax daily. Blood pressure is controlled on Lisinopril 10mg  daily. Asymptomatic of GERD. Multiple site osteoarthritis is chronic, Tylenol help to ease aches and pains.   Past Medical History  Diagnosis Date  . Malignant neoplasm of colon, unspecified site   . Anxiety state, unspecified   . Alzheimer's disease   . Unspecified essential hypertension   . Chronic airway obstruction, not elsewhere classified   . Barrett's esophagus   . Osteoarthrosis, unspecified whether generalized or localized, unspecified site   . Osteoporosis, unspecified   . Shortness of breath   . Flatulence, eructation, and gas pain   . Urinary frequency   . Hyperglycemia     . Pressure ulcer, heel(707.07)   . Pneumonia, organism unspecified 02/24/2013  . Hypotension, unspecified 02/21/2013  . Hemorrhage of gastrointestinal tract, unspecified 02/21/2013  . Altered mental status 02/21/2013  . Unspecified fall 06/13/2012  . Joint pain of lower extremity 04/15/2012  . Unspecified vitamin D deficiency 05/07/2010  . Altered mental status 04/11/2010  . Closed fracture of distal end of ulna (alone) 04/12  . Disorders of bursae and tendons in shoulder region, unspecified 06/21/2009  . Pain in joint, pelvic region and thigh 04/12/2009  . Anemia, unspecified 04/05/2009  . Hip joint replacement by other means 03/23/2009  . Unspecified constipation 10/09/2008  . Aortic aneurysm of unspecified site without mention of rupture 07/28/2005  . Osteoarthrosis, unspecified whether generalized or localized, unspecified site 07/28/2005  . Osteoporosis, unspecified 07/28/2005  . Other emphysema ([place]) 07/29/1999  . Raynaud's syndrome 07/28/1998   Past Surgical History  Procedure Laterality Date  . Dilation and curettage of uterus    . Colon surgery  2001    partial colon resecton for cancer  . Cataract extraction w/ intraocular lens  implant, bilateral  2004  . Total hip arthroplasty Left 2010    Allergies  Allergen Reactions  . Morphine And Related     Per MAR  . Oxytrol [Oxybutynin]     Per [facility]      Medication List       This list is accurate as of: 03/30/16 11:34 AM.  Always use your most recent med list.  acetaminophen 325 MG tablet  Commonly known as:  TYLENOL  Take 650 mg by mouth 2 (two) times daily. *Also takes 650mg  every 6 hours as needed*     citalopram 10 MG tablet  Commonly known as:  CELEXA  Take 5 mg by mouth daily. Take /12 daily to help nerves and depression.     ergocalciferol 50000 units capsule  Commonly known as:  VITAMIN D2  Take 50,000 Units by mouth every 30 (thirty) days. *Takes on the 19th of each month*      fluticasone 50 MCG/ACT nasal spray  Commonly known as:  FLONASE  Place 2 sprays into both nostrils at bedtime.     lactose free nutrition Liqd  Take 237 mLs by mouth daily.     lisinopril 10 MG tablet  Commonly known as:  PRINIVIL,ZESTRIL  Take 10 mg by mouth daily.     loratadine 10 MG tablet  Commonly known as:  CLARITIN  Take 10 mg by mouth daily.     LORazepam 0.5 MG tablet  Commonly known as:  ATIVAN  Take 0.25 mg by mouth every morning. 0.25mg  bid prn for anxiety.     omeprazole 20 MG capsule  Commonly known as:  PRILOSEC  Take 20 mg by mouth daily.     polyethylene glycol packet  Commonly known as:  MIRALAX / GLYCOLAX  Take 17 g by mouth daily.     polyvinyl alcohol 1.4 % ophthalmic solution  Commonly known as:  LIQUIFILM TEARS  Place 1 drop into both eyes 3 (three) times daily.        Review of Systems  Constitutional: Negative for fever and chills.  HENT: Positive for hearing loss. Negative for congestion and ear discharge.   Eyes: Negative for pain and redness.  Respiratory: Negative for cough, shortness of breath and wheezing.   Cardiovascular: Negative for chest pain, palpitations and leg swelling.  Gastrointestinal: Negative for nausea, vomiting, abdominal pain, diarrhea and constipation.  Genitourinary: Positive for frequency. Negative for dysuria and urgency.       Incontinent of bladder  Musculoskeletal: Positive for back pain. Negative for myalgias and neck pain.  Skin: Negative for rash.       Dry skin .  Neurological: Negative for dizziness, tremors, seizures, weakness and headaches.  Psychiatric/Behavioral: Negative for suicidal ideas and hallucinations. The patient is nervous/anxious.     Immunization History  Administered Date(s) Administered  . Influenza Whole 10/24/2012  . Influenza-Unspecified 10/31/2014, 09/18/2015  . Pneumococcal Polysaccharide-23 12/29/2003  . Td 12/28/2006  . Tdap 03/13/2014   Pertinent  Health Maintenance Due    Topic Date Due  . DEXA SCAN  02/09/1984  . PNA vac Low Risk Adult (2 of 2 - PCV13) 12/28/2004  . INFLUENZA VACCINE  07/28/2016   Fall Risk  05/02/2015  Falls in the past year? No  Risk for fall due to : Impaired balance/gait;Impaired mobility   Functional Status Survey:    Filed Vitals:   03/30/16 1041  BP: 130/68  Pulse: 72  Temp: 97.1 F (36.2 C)  TempSrc: Oral  Resp: 18  Height: 5\' 3"  (1.6 m)  Weight: 136 lb 6.4 oz (61.871 kg)   Body mass index is 24.17 kg/(m^2). Physical Exam  Constitutional: She appears well-developed and well-nourished.  HENT:  Head: Normocephalic and atraumatic.  Eyes: Conjunctivae and EOM are normal. Pupils are equal, round, and reactive to light. Right eye exhibits no discharge. Left eye exhibits no discharge.  Neck: Normal range of motion.  Neck supple. No JVD present. No thyromegaly present.  Cardiovascular: Normal rate, regular rhythm and normal heart sounds.   No murmur heard. Pulmonary/Chest: Effort normal. She has no wheezes. She has rales (bibasilar dry rales appreciated).  Abdominal: Soft. Bowel sounds are normal. There is no tenderness.  Musculoskeletal: Normal range of motion. She exhibits no edema or tenderness.  Neurological: She is alert. No cranial nerve deficit. She exhibits normal muscle tone. Coordination normal.  Skin: Skin is warm and dry. No rash noted.  On and off dry skin itching   Psychiatric: Her mood appears anxious. Her affect is labile. Her affect is not angry. Her speech is tangential. Her speech is not slurred. She is agitated (when assisted with peronal care) and combative (when she doesn't understad what you want her to do). She is not aggressive, not hyperactive and not withdrawn. Thought content is not paranoid and not delusional. Cognition and memory are impaired. She expresses impulsivity and inappropriate judgment. She does not exhibit a depressed mood. She exhibits abnormal recent memory and abnormal remote memory.     Labs reviewed: No results for input(s): NA, K, CL, CO2, GLUCOSE, BUN, CREATININE, CALCIUM, MG, PHOS in the last 8760 hours. No results for input(s): AST, ALT, ALKPHOS, BILITOT, PROT, ALBUMIN in the last 8760 hours. No results for input(s): WBC, NEUTROABS, HGB, HCT, MCV, PLT in the last 8760 hours. Lab Results  Component Value Date   TSH 0.74 03/20/2014   No results found for: HGBA1C No results found for: CHOL, HDL, LDLCALC, LDLDIRECT, TRIG, CHOLHDL  Significant Diagnostic Results in last 30 days:  No results found.  Assessment/Plan  FTT (failure to thrive) in adult Continue supportive measures, update CBC, CMP, TSH  Osteoarthritis Multiple sites, continueTylenol 650mg  bid and Prn  GERD (gastroesophageal reflux disease) Asymptomatic, continue Omeprazole 20mg  daily.   Essential hypertension Controlled, continue Lisinopril 10mg   Constipation Stable, continue MiraLax daily  Anxiety state Stable, continue Celexa 5mg    Alzheimer's disease End stage, off memory preserving meds, total care of ADLs    Family/ staff Communication: continue SNF for care needs  Labs/tests ordered:  CBC, CMP, TSH

## 2016-03-30 NOTE — Assessment & Plan Note (Signed)
Continue supportive measures, update CBC, CMP, TSH

## 2016-03-30 NOTE — Assessment & Plan Note (Signed)
End stage, off memory preserving meds, total care of ADLs 

## 2016-03-31 LAB — CBC AND DIFFERENTIAL
HEMATOCRIT: 38 % (ref 36–46)
Hemoglobin: 12.9 g/dL (ref 12.0–16.0)
Platelets: 349 10*3/uL (ref 150–399)
WBC: 7.3 10*3/mL

## 2016-03-31 LAB — BASIC METABOLIC PANEL
BUN: 31 mg/dL — AB (ref 4–21)
Creatinine: 0.8 mg/dL (ref 0.5–1.1)
GLUCOSE: 85 mg/dL
Potassium: 4.5 mmol/L (ref 3.4–5.3)
Sodium: 139 mmol/L (ref 137–147)

## 2016-03-31 LAB — TSH: TSH: 0.47 u[IU]/mL (ref 0.41–5.90)

## 2016-03-31 LAB — HEPATIC FUNCTION PANEL
ALT: 13 U/L (ref 7–35)
AST: 15 U/L (ref 13–35)
Alkaline Phosphatase: 98 U/L (ref 25–125)
Bilirubin, Total: 0.6 mg/dL

## 2016-05-01 ENCOUNTER — Non-Acute Institutional Stay (SKILLED_NURSING_FACILITY): Payer: 59 | Admitting: Nurse Practitioner

## 2016-05-01 ENCOUNTER — Encounter: Payer: Self-pay | Admitting: Nurse Practitioner

## 2016-05-01 DIAGNOSIS — M150 Primary generalized (osteo)arthritis: Secondary | ICD-10-CM

## 2016-05-01 DIAGNOSIS — K219 Gastro-esophageal reflux disease without esophagitis: Secondary | ICD-10-CM | POA: Diagnosis not present

## 2016-05-01 DIAGNOSIS — K5900 Constipation, unspecified: Secondary | ICD-10-CM

## 2016-05-01 DIAGNOSIS — R627 Adult failure to thrive: Secondary | ICD-10-CM | POA: Diagnosis not present

## 2016-05-01 DIAGNOSIS — F411 Generalized anxiety disorder: Secondary | ICD-10-CM | POA: Diagnosis not present

## 2016-05-01 DIAGNOSIS — F0280 Dementia in other diseases classified elsewhere without behavioral disturbance: Secondary | ICD-10-CM

## 2016-05-01 DIAGNOSIS — G308 Other Alzheimer's disease: Secondary | ICD-10-CM | POA: Diagnosis not present

## 2016-05-01 DIAGNOSIS — M159 Polyosteoarthritis, unspecified: Secondary | ICD-10-CM

## 2016-05-01 DIAGNOSIS — I10 Essential (primary) hypertension: Secondary | ICD-10-CM

## 2016-05-01 NOTE — Assessment & Plan Note (Signed)
Stable, continue MiraLax daily.  

## 2016-05-01 NOTE — Assessment & Plan Note (Signed)
Controlled, continue Lisinopril 10mg 

## 2016-05-01 NOTE — Assessment & Plan Note (Addendum)
Stable, continue Celexa 5mg , 03/31/16 TSH 0.47

## 2016-05-01 NOTE — Progress Notes (Signed)
Patient ID: [name], female   DOB: 10-06-1917, 82 y.o.   MRN: [id number]  Location:  [facility] Room Number: 16 Place of Service:  SNF (31) Provider: Lennie Odor Mariadelosang Wynns NP  GREEN, [name], MD  Patient Care Team: Estill Dooms, MD as PCP - General (Internal Medicine) [place] Gottfried Standish X, NP as Nurse Practitioner (Nurse Practitioner)  Extended Emergency Contact Information Primary Emergency Contact: [facility] Address: [address]          [place],  [place] Home Phone: [phone] Relation: None  Code Status:  DNR Goals of care: Advanced Directive information Advanced Directives 05/01/2016  Does patient have an advance directive? Yes  Type of Advance Directive Living will;Out of facility DNR (pink MOST or yellow form)  Does patient want to make changes to advanced directive? No - Patient declined  Copy of advanced directive(s) in chart? Yes     Chief Complaint  Patient presents with  . Medical Management of Chronic Issues    Routine visit    HPI:  Pt is a 82 y.o. female seen today for medical management of chronic diseases.   Hx of Alzheimer's dementia, end stage, total care, off memory preserving meds. Mood has been  stabilized while on Celexa 5mg  and Lorazepam. Regular BM while on MiraLax daily. Blood pressure is controlled on Lisinopril 10mg  daily. Asymptomatic of GERD. Multiple site osteoarthritis is chronic, Tylenol help to ease aches and pains.   Past Medical History  Diagnosis Date  . Malignant neoplasm of colon, unspecified site   . Anxiety state, unspecified   . Alzheimer's disease   . Unspecified essential hypertension   . Chronic airway obstruction, not elsewhere classified   . Barrett's esophagus   . Osteoarthrosis, unspecified whether generalized or localized, unspecified site   . Osteoporosis, unspecified   . Shortness of breath   . Flatulence, eructation, and gas pain   . Urinary frequency   . Hyperglycemia     . Pressure ulcer, heel(707.07)   . Pneumonia, organism unspecified 02/24/2013  . Hypotension, unspecified 02/21/2013  . Hemorrhage of gastrointestinal tract, unspecified 02/21/2013  . Altered mental status 02/21/2013  . Unspecified fall 06/13/2012  . Joint pain of lower extremity 04/15/2012  . Unspecified vitamin D deficiency 05/07/2010  . Altered mental status 04/11/2010  . Closed fracture of distal end of ulna (alone) 04/12  . Disorders of bursae and tendons in shoulder region, unspecified 06/21/2009  . Pain in joint, pelvic region and thigh 04/12/2009  . Anemia, unspecified 04/05/2009  . Hip joint replacement by other means 03/23/2009  . Unspecified constipation 10/09/2008  . Aortic aneurysm of unspecified site without mention of rupture 07/28/2005  . Osteoarthrosis, unspecified whether generalized or localized, unspecified site 07/28/2005  . Osteoporosis, unspecified 07/28/2005  . Other emphysema ([place]) 07/29/1999  . Raynaud's syndrome 07/28/1998   Past Surgical History  Procedure Laterality Date  . Dilation and curettage of uterus    . Colon surgery  2001    partial colon resecton for cancer  . Cataract extraction w/ intraocular lens  implant, bilateral  2004  . Total hip arthroplasty Left 2010    Allergies  Allergen Reactions  . Morphine And Related     Per MAR  . Oxytrol [Oxybutynin]     Per [facility]      Medication List       This list is accurate as of: 05/01/16  4:09 PM.  Always use your most recent med list.  acetaminophen 325 MG tablet  Commonly known as:  TYLENOL  Take 650 mg by mouth 2 (two) times daily. *Also takes 650mg  every 6 hours as needed*     citalopram 10 MG tablet  Commonly known as:  CELEXA  Take 5 mg by mouth daily. Take /12 daily to help nerves and depression.     ergocalciferol 50000 units capsule  Commonly known as:  VITAMIN D2  Take 50,000 Units by mouth every 30 (thirty) days. *Takes on the 19th of each month*      fluticasone 50 MCG/ACT nasal spray  Commonly known as:  FLONASE  Place 2 sprays into both nostrils at bedtime.     guaifenesin 100 MG/5ML syrup  Commonly known as:  ROBITUSSIN  Take 10 mLs by mouth every 6 (six) hours as needed for cough.     lactose free nutrition Liqd  Take 237 mLs by mouth daily.     lisinopril 10 MG tablet  Commonly known as:  PRINIVIL,ZESTRIL  Take 10 mg by mouth daily.     loratadine 10 MG tablet  Commonly known as:  CLARITIN  Take 10 mg by mouth daily as needed for itching.     LORazepam 0.5 MG tablet  Commonly known as:  ATIVAN  Take 0.25 mg by mouth 2 (two) times daily. for anxiety     omeprazole 20 MG capsule  Commonly known as:  PRILOSEC  Take 20 mg by mouth daily.     polyethylene glycol packet  Commonly known as:  MIRALAX / GLYCOLAX  Take 17 g by mouth daily.     polyvinyl alcohol 1.4 % ophthalmic solution  Commonly known as:  LIQUIFILM TEARS  Place 1 drop into both eyes 3 (three) times daily.        Review of Systems  Constitutional: Negative for fever and chills.  HENT: Positive for hearing loss. Negative for congestion and ear discharge.   Eyes: Negative for pain and redness.  Respiratory: Negative for cough, shortness of breath and wheezing.   Cardiovascular: Negative for chest pain, palpitations and leg swelling.  Gastrointestinal: Negative for nausea, vomiting, abdominal pain, diarrhea and constipation.  Genitourinary: Positive for frequency. Negative for dysuria and urgency.       Incontinent of bladder  Musculoskeletal: Positive for back pain. Negative for myalgias and neck pain.  Skin: Negative for rash.       Dry skin .  Neurological: Negative for dizziness, tremors, seizures, weakness and headaches.  Psychiatric/Behavioral: Negative for suicidal ideas and hallucinations. The patient is nervous/anxious.     Immunization History  Administered Date(s) Administered  . Influenza Whole 10/24/2012  . Influenza-Unspecified  10/31/2014, 09/18/2015  . Pneumococcal Polysaccharide-23 12/29/2003  . Td 12/28/2006  . Tdap 03/13/2014   Pertinent  Health Maintenance Due  Topic Date Due  . DEXA SCAN  02/09/1984  . PNA vac Low Risk Adult (2 of 2 - PCV13) 12/28/2004  . INFLUENZA VACCINE  07/28/2016   Fall Risk  05/02/2015  Falls in the past year? No  Risk for fall due to : Impaired balance/gait;Impaired mobility   Functional Status Survey:    Filed Vitals:   05/01/16 1212  BP: 116/68  Pulse: 72  Temp: 98.1 F (36.7 C)  TempSrc: Oral  Resp: 20  Height: 5\' 3"  (1.6 m)  Weight: 134 lb 8 oz (61.009 kg)   Body mass index is 23.83 kg/(m^2). Physical Exam  Constitutional: She appears well-developed and well-nourished.  HENT:  Head: Normocephalic and atraumatic.  Eyes: Conjunctivae and EOM are normal. Pupils are equal, round, and reactive to light. Right eye exhibits no discharge. Left eye exhibits no discharge.  Neck: Normal range of motion. Neck supple. No JVD present. No thyromegaly present.  Cardiovascular: Normal rate, regular rhythm and normal heart sounds.   No murmur heard. Pulmonary/Chest: Effort normal. She has no wheezes. She has rales (bibasilar dry rales appreciated).  Abdominal: Soft. Bowel sounds are normal. There is no tenderness.  Musculoskeletal: Normal range of motion. She exhibits no edema or tenderness.  Neurological: She is alert. No cranial nerve deficit. She exhibits normal muscle tone. Coordination normal.  Skin: Skin is warm and dry. No rash noted.  On and off dry skin itching   Psychiatric: Her mood appears anxious. Her affect is labile. Her affect is not angry. Her speech is tangential. Her speech is not slurred. She is agitated (when assisted with peronal care) and combative (when she doesn't understad what you want her to do). She is not aggressive, not hyperactive and not withdrawn. Thought content is not paranoid and not delusional. Cognition and memory are impaired. She expresses  impulsivity and inappropriate judgment. She does not exhibit a depressed mood. She exhibits abnormal recent memory and abnormal remote memory.    Labs reviewed:  Recent Labs  03/31/16  NA 139  K 4.5  BUN 31*  CREATININE 0.8    Recent Labs  03/31/16  AST 15  ALT 13  ALKPHOS 98    Recent Labs  03/31/16  WBC 7.3  HGB 12.9  HCT 38  PLT 349   Lab Results  Component Value Date   TSH 0.47 03/31/2016   No results found for: HGBA1C No results found for: CHOL, HDL, LDLCALC, LDLDIRECT, TRIG, CHOLHDL  Significant Diagnostic Results in last 30 days:  No results found.  Assessment/Plan  Alzheimer's disease End stage, off memory preserving meds, total care of ADLs   Anxiety state Stable, continue Celexa 5mg , 03/31/16 TSH 0.47    Constipation Stable, continue MiraLax daily   Essential hypertension Controlled, continue Lisinopril 10mg    FTT (failure to thrive) in adult Continue supportive measures, 03/31/16 total protein 7.0, albumin 3.5   GERD (gastroesophageal reflux disease) Asymptomatic, continue Omeprazole 20mg  daily.    Osteoarthritis Multiple sites, continueTylenol 650mg  bid and Prn     Family/ staff Communication: continue SNF for care needs  Labs/tests ordered: none

## 2016-05-01 NOTE — Assessment & Plan Note (Signed)
Asymptomatic, continue Omeprazole 20mg daily.  

## 2016-05-01 NOTE — Assessment & Plan Note (Signed)
End stage, off memory preserving meds, total care of ADLs

## 2016-05-01 NOTE — Assessment & Plan Note (Addendum)
Continue supportive measures, 03/31/16 total protein 7.0, albumin 3.5

## 2016-05-01 NOTE — Assessment & Plan Note (Signed)
Multiple sites, continueTylenol 650mg bid and Prn 

## 2016-05-29 ENCOUNTER — Encounter: Payer: Self-pay | Admitting: Nurse Practitioner

## 2016-05-29 ENCOUNTER — Non-Acute Institutional Stay (SKILLED_NURSING_FACILITY): Payer: 59 | Admitting: Nurse Practitioner

## 2016-05-29 DIAGNOSIS — K5900 Constipation, unspecified: Secondary | ICD-10-CM

## 2016-05-29 DIAGNOSIS — F0281 Dementia in other diseases classified elsewhere with behavioral disturbance: Secondary | ICD-10-CM

## 2016-05-29 DIAGNOSIS — F411 Generalized anxiety disorder: Secondary | ICD-10-CM

## 2016-05-29 DIAGNOSIS — K219 Gastro-esophageal reflux disease without esophagitis: Secondary | ICD-10-CM | POA: Diagnosis not present

## 2016-05-29 DIAGNOSIS — I10 Essential (primary) hypertension: Secondary | ICD-10-CM

## 2016-05-29 DIAGNOSIS — R627 Adult failure to thrive: Secondary | ICD-10-CM | POA: Diagnosis not present

## 2016-05-29 DIAGNOSIS — M25519 Pain in unspecified shoulder: Secondary | ICD-10-CM

## 2016-05-29 DIAGNOSIS — F02818 Dementia in other diseases classified elsewhere, unspecified severity, with other behavioral disturbance: Secondary | ICD-10-CM

## 2016-05-29 DIAGNOSIS — G308 Other Alzheimer's disease: Secondary | ICD-10-CM | POA: Diagnosis not present

## 2016-05-29 NOTE — Progress Notes (Signed)
Patient ID: [name], female   DOB: Apr 16, 1917, 82 y.o.   MRN: [id number]  Location:  [place] Room Number: 16 Place of Service:  SNF (31) Provider: Lennie Odor Aideliz Garmany NP  GREEN, [name], MD  Patient Care Team: Estill Dooms, MD as PCP - General (Internal Medicine) [place] Anisha Starliper X, NP as Nurse Practitioner (Nurse Practitioner)  Extended Emergency Contact Information Primary Emergency Contact: [facility] Address: [address]          [place],  [place] Home Phone: [phone] Relation: None  Code Status:  DNR Goals of care: Advanced Directive information Advanced Directives 05/29/2016  Does patient have an advance directive? Yes  Type of Advance Directive Living will;Out of facility DNR (pink MOST or yellow form)  Does patient want to make changes to advanced directive? No - Patient declined  Copy of advanced directive(s) in chart? Yes     Chief Complaint  Patient presents with  . Medical Management of Chronic Issues    HPI:  Pt is a 82 y.o. female seen today for medical management of chronic diseases.   Hx of Alzheimer's dementia, end stage, total care, off memory preserving meds. Mood has been  stabilized while on Celexa 5mg  and Lorazepam. Regular BM while on MiraLax daily. Blood pressure is controlled on Lisinopril 10mg  daily. Asymptomatic of GERD. Multiple site osteoarthritis is chronic, Tylenol help to ease aches and pains.   Past Medical History  Diagnosis Date  . Malignant neoplasm of colon, unspecified site   . Anxiety state, unspecified   . Alzheimer's disease   . Unspecified essential hypertension   . Chronic airway obstruction, not elsewhere classified   . Barrett's esophagus   . Osteoarthrosis, unspecified whether generalized or localized, unspecified site   . Osteoporosis, unspecified   . Shortness of breath   . Flatulence, eructation, and gas pain   . Urinary frequency   . Hyperglycemia   . Pressure  ulcer, heel(707.07)   . Pneumonia, organism unspecified 02/24/2013  . Hypotension, unspecified 02/21/2013  . Hemorrhage of gastrointestinal tract, unspecified 02/21/2013  . Altered mental status 02/21/2013  . Unspecified fall 06/13/2012  . Joint pain of lower extremity 04/15/2012  . Unspecified vitamin D deficiency 05/07/2010  . Altered mental status 04/11/2010  . Closed fracture of distal end of ulna (alone) 04/12  . Disorders of bursae and tendons in shoulder region, unspecified 06/21/2009  . Pain in joint, pelvic region and thigh 04/12/2009  . Anemia, unspecified 04/05/2009  . Hip joint replacement by other means 03/23/2009  . Unspecified constipation 10/09/2008  . Aortic aneurysm of unspecified site without mention of rupture 07/28/2005  . Osteoarthrosis, unspecified whether generalized or localized, unspecified site 07/28/2005  . Osteoporosis, unspecified 07/28/2005  . Other emphysema ([place]) 07/29/1999  . Raynaud's syndrome 07/28/1998   Past Surgical History  Procedure Laterality Date  . Dilation and curettage of uterus    . Colon surgery  2001    partial colon resecton for cancer  . Cataract extraction w/ intraocular lens  implant, bilateral  2004  . Total hip arthroplasty Left 2010    Allergies  Allergen Reactions  . Morphine And Related     Per MAR  . Oxytrol [Oxybutynin]     Per [facility]      Medication List       This list is accurate as of: 05/29/16  4:31 PM.  Always use your most recent med list.  acetaminophen 325 MG tablet  Commonly known as:  TYLENOL  Take 650 mg by mouth 2 (two) times daily. *Also takes 650mg  every 6 hours as needed*     citalopram 10 MG tablet  Commonly known as:  CELEXA  Take 5 mg by mouth daily. Take /12 daily to help nerves and depression.     ergocalciferol 50000 units capsule  Commonly known as:  VITAMIN D2  Take 50,000 Units by mouth every 30 (thirty) days. *Takes on the 19th of each month*     fluticasone 50  MCG/ACT nasal spray  Commonly known as:  FLONASE  Place 2 sprays into both nostrils at bedtime.     guaifenesin 100 MG/5ML syrup  Commonly known as:  ROBITUSSIN  Take 10 mLs by mouth every 6 (six) hours as needed for cough.     lactose free nutrition Liqd  Take 237 mLs by mouth daily.     lisinopril 10 MG tablet  Commonly known as:  PRINIVIL,ZESTRIL  Take 10 mg by mouth daily.     loratadine 10 MG tablet  Commonly known as:  CLARITIN  Take 10 mg by mouth daily as needed for itching.     omeprazole 20 MG capsule  Commonly known as:  PRILOSEC  Take 20 mg by mouth daily.     polyethylene glycol packet  Commonly known as:  MIRALAX / GLYCOLAX  Take 17 g by mouth daily.     polyvinyl alcohol 1.4 % ophthalmic solution  Commonly known as:  LIQUIFILM TEARS  Place 1 drop into both eyes 3 (three) times daily.        Review of Systems  Constitutional: Negative for fever and chills.  HENT: Positive for hearing loss. Negative for congestion and ear discharge.   Eyes: Negative for pain and redness.  Respiratory: Negative for cough, shortness of breath and wheezing.   Cardiovascular: Negative for chest pain, palpitations and leg swelling.  Gastrointestinal: Negative for nausea, vomiting, abdominal pain, diarrhea and constipation.  Genitourinary: Positive for frequency. Negative for dysuria and urgency.       Incontinent of bladder  Musculoskeletal: Positive for back pain. Negative for myalgias and neck pain.  Skin: Negative for rash.       Dry skin .  Neurological: Negative for dizziness, tremors, seizures, weakness and headaches.  Psychiatric/Behavioral: Negative for suicidal ideas and hallucinations. The patient is nervous/anxious.     Immunization History  Administered Date(s) Administered  . Influenza Whole 10/24/2012  . Influenza-Unspecified 10/31/2014, 09/18/2015  . Pneumococcal Polysaccharide-23 12/29/2003  . Td 12/28/2006  . Tdap 03/13/2014   Pertinent  Health  Maintenance Due  Topic Date Due  . DEXA SCAN  02/09/1984  . PNA vac Low Risk Adult (2 of 2 - PCV13) 12/28/2004  . INFLUENZA VACCINE  07/28/2016   Fall Risk  05/02/2015  Falls in the past year? No  Risk for fall due to : Impaired balance/gait;Impaired mobility   Functional Status Survey:    Filed Vitals:   05/29/16 0937  BP: 128/70  Pulse: 62  Temp: 97.9 F (36.6 C)  TempSrc: Oral  Resp: 18  Height: 5\' 3"  (1.6 m)  Weight: 134 lb 8 oz (61.009 kg)   Body mass index is 23.83 kg/(m^2). Physical Exam  Constitutional: She appears well-developed and well-nourished.  HENT:  Head: Normocephalic and atraumatic.  Eyes: Conjunctivae and EOM are normal. Pupils are equal, round, and reactive to light. Right eye exhibits no discharge. Left eye exhibits no discharge.  Neck:  Normal range of motion. Neck supple. No JVD present. No thyromegaly present.  Cardiovascular: Normal rate, regular rhythm and normal heart sounds.   No murmur heard. Pulmonary/Chest: Effort normal. She has no wheezes. She has rales (bibasilar dry rales appreciated).  Abdominal: Soft. Bowel sounds are normal. There is no tenderness.  Musculoskeletal: Normal range of motion. She exhibits no edema or tenderness.  Neurological: She is alert. No cranial nerve deficit. She exhibits normal muscle tone. Coordination normal.  Skin: Skin is warm and dry. No rash noted.  On and off dry skin itching   Psychiatric: Her mood appears anxious. Her affect is labile. Her affect is not angry. Her speech is tangential. Her speech is not slurred. She is agitated (when assisted with peronal care) and combative (when she doesn't understad what you want her to do). She is not aggressive, not hyperactive and not withdrawn. Thought content is not paranoid and not delusional. Cognition and memory are impaired. She expresses impulsivity and inappropriate judgment. She does not exhibit a depressed mood. She exhibits abnormal recent memory and abnormal  remote memory.    Labs reviewed:  Recent Labs  03/31/16  NA 139  K 4.5  BUN 31*  CREATININE 0.8    Recent Labs  03/31/16  AST 15  ALT 13  ALKPHOS 98    Recent Labs  03/31/16  WBC 7.3  HGB 12.9  HCT 38  PLT 349   Lab Results  Component Value Date   TSH 0.47 03/31/2016   No results found for: HGBA1C No results found for: CHOL, HDL, LDLCALC, LDLDIRECT, TRIG, CHOLHDL  Significant Diagnostic Results in last 30 days:  No results found.  Assessment/Plan  Essential hypertension Controlled, continue Lisinopril 10mg   Chronic airway obstruction, not elsewhere classified stable  Constipation Stable, continue MiraLax daily   GERD (gastroesophageal reflux disease) Asymptomatic, continue Omeprazole 20mg  daily.   Alzheimer's disease End stage, off memory preserving meds, total care of ADLs  Anxiety state Stable, continue Celexa 5mg , 03/31/16 TSH 0.47  FTT (failure to thrive) in adult Continue supportive measures, 03/31/16 total protein 7.0, albumin 3.5   Pain in joint, shoulder region Multiple sites, continueTylenol 650mg  bid and Prn    Family/ staff Communication: continue SNF for care needs  Labs/tests ordered: none

## 2016-05-29 NOTE — Assessment & Plan Note (Signed)
Asymptomatic, continue Omeprazole 20mg daily.  

## 2016-05-29 NOTE — Assessment & Plan Note (Signed)
Controlled, continue Lisinopril 10mg 

## 2016-05-29 NOTE — Assessment & Plan Note (Signed)
Continue supportive measures, 03/31/16 total protein 7.0, albumin 3.5

## 2016-05-29 NOTE — Assessment & Plan Note (Signed)
Multiple sites, continueTylenol 650mg bid and Prn 

## 2016-05-29 NOTE — Assessment & Plan Note (Signed)
Stable, continue MiraLax daily.  

## 2016-05-29 NOTE — Assessment & Plan Note (Signed)
Stable, continue Celexa 5mg , 03/31/16 TSH 0.47

## 2016-05-29 NOTE — Assessment & Plan Note (Signed)
End stage, off memory preserving meds, total care of ADLs

## 2016-05-29 NOTE — Assessment & Plan Note (Signed)
stable °

## 2016-07-06 ENCOUNTER — Encounter: Payer: Self-pay | Admitting: Internal Medicine

## 2016-07-06 ENCOUNTER — Non-Acute Institutional Stay (SKILLED_NURSING_FACILITY): Payer: 59 | Admitting: Internal Medicine

## 2016-07-06 DIAGNOSIS — G308 Other Alzheimer's disease: Secondary | ICD-10-CM | POA: Diagnosis not present

## 2016-07-06 DIAGNOSIS — I10 Essential (primary) hypertension: Secondary | ICD-10-CM

## 2016-07-06 DIAGNOSIS — F0281 Dementia in other diseases classified elsewhere with behavioral disturbance: Secondary | ICD-10-CM

## 2016-07-06 DIAGNOSIS — F02818 Dementia in other diseases classified elsewhere, unspecified severity, with other behavioral disturbance: Secondary | ICD-10-CM

## 2016-07-06 NOTE — Progress Notes (Signed)
Patient ID: [name], female   DOB: 1917-12-28, 82 y.o.   MRN: [id number]  Location:   [place] Room Number: [id number] Place of Service:  SNF (31) Provider:  Estill Dooms, MD  Patient Care Team: Estill Dooms, MD as PCP - General (Internal Medicine) Birney Man Otho Darner, NP as Nurse Practitioner (Nurse Practitioner)  Extended Emergency Contact Information Primary Emergency Contact: [facility] Address: [address]          [place],  [place] Home Phone: [phone] Relation: None   Goals of care: Advanced Directive information Advanced Directives 07/06/2016  Does patient have an advance directive? Yes  Type of Advance Directive Living will;Out of facility DNR (pink MOST or yellow form)  Does patient want to make changes to advanced directive? -  Copy of advanced directive(s) in chart? Yes  Pre-existing out of facility DNR order (yellow form or pink MOST form) Yellow form placed in chart (order not valid for inpatient use)     Chief Complaint  Patient presents with  . Medical Management of Chronic Issues    medication management, routine    HPI:  Pt is a 82 y.o. female seen today for medical management of chronic diseases.    Alzheimer's disease of other onset with behavioral disturbance - unchanged   Essential hypertension - controlled     Past Medical History  Diagnosis Date  . Malignant neoplasm of colon, unspecified site   . Anxiety state, unspecified   . Alzheimer's disease   . Unspecified essential hypertension   . Chronic airway obstruction, not elsewhere classified   . Barrett's esophagus   . Osteoarthrosis, unspecified whether generalized or localized, unspecified site   . Osteoporosis, unspecified   . Shortness of breath   . Flatulence, eructation, and gas pain   . Urinary frequency   . Hyperglycemia   . Pressure ulcer, heel(707.07)   . Pneumonia, organism unspecified 02/24/2013  . Hypotension, unspecified 02/21/2013  .  Hemorrhage of gastrointestinal tract, unspecified 02/21/2013  . Altered mental status 02/21/2013  . Unspecified fall 06/13/2012  . Joint pain of lower extremity 04/15/2012  . Unspecified vitamin D deficiency 05/07/2010  . Altered mental status 04/11/2010  . Closed fracture of distal end of ulna (alone) 04/12  . Disorders of bursae and tendons in shoulder region, unspecified 06/21/2009  . Pain in joint, pelvic region and thigh 04/12/2009  . Anemia, unspecified 04/05/2009  . Hip joint replacement by other means 03/23/2009  . Unspecified constipation 10/09/2008  . Aortic aneurysm of unspecified site without mention of rupture 07/28/2005  . Osteoarthrosis, unspecified whether generalized or localized, unspecified site 07/28/2005  . Osteoporosis, unspecified 07/28/2005  . Other emphysema ([place]) 07/29/1999  . Raynaud's syndrome 07/28/1998   Past Surgical History  Procedure Laterality Date  . Dilation and curettage of uterus    . Colon surgery  2001    partial colon resecton for cancer  . Cataract extraction w/ intraocular lens  implant, bilateral  2004  . Total hip arthroplasty Left 2010    Allergies  Allergen Reactions  . Morphine And Related     Per MAR  . Oxytrol [Oxybutynin]     Per [facility]      Medication List       This list is accurate as of: 07/06/16 12:10 PM.  Always use your most recent med list.               acetaminophen 325 MG tablet  Commonly known  as:  TYLENOL  Take 650 mg by mouth 2 (two) times daily. *Also takes 650mg  every 6 hours as needed*     ergocalciferol 50000 units capsule  Commonly known as:  VITAMIN D2  Take 50,000 Units by mouth every 30 (thirty) days. *Takes on the 19th of each month*     fluticasone 50 MCG/ACT nasal spray  Commonly known as:  FLONASE  Place 2 sprays into both nostrils at bedtime.     guaifenesin 100 MG/5ML syrup  Commonly known as:  ROBITUSSIN  Take 10 mLs by mouth every 6 (six) hours as needed for cough.     lactose  free nutrition Liqd  Take 237 mLs by mouth. Drink one can every day at 6 pm     lisinopril 10 MG tablet  Commonly known as:  PRINIVIL,ZESTRIL  Take 10 mg by mouth daily.     loratadine 10 MG tablet  Commonly known as:  CLARITIN  Take 10 mg by mouth daily as needed for itching.     omeprazole 20 MG capsule  Commonly known as:  PRILOSEC  Take 20 mg by mouth daily.     polyethylene glycol packet  Commonly known as:  MIRALAX / GLYCOLAX  Take 17 g by mouth daily.     polyvinyl alcohol 1.4 % ophthalmic solution  Commonly known as:  LIQUIFILM TEARS  Place 1 drop into both eyes 3 (three) times daily.        Review of Systems  Constitutional: Negative for fever and chills.  HENT: Positive for hearing loss. Negative for congestion and ear discharge.   Eyes: Negative for pain and redness.  Respiratory: Negative for cough, shortness of breath and wheezing.   Cardiovascular: Negative for chest pain, palpitations and leg swelling.  Gastrointestinal: Negative for nausea, vomiting, abdominal pain, diarrhea and constipation.  Genitourinary: Positive for frequency. Negative for dysuria and urgency.       Incontinent of bladder  Musculoskeletal: Positive for back pain. Negative for myalgias and neck pain.  Skin: Negative for rash.       Dry skin .  Neurological: Negative for dizziness, tremors, seizures, weakness and headaches.  Psychiatric/Behavioral: Negative for suicidal ideas and hallucinations. The patient is nervous/anxious.     Immunization History  Administered Date(s) Administered  . Influenza Whole 10/24/2012  . Influenza-Unspecified 10/31/2014, 09/18/2015  . Pneumococcal Polysaccharide-23 12/29/2003  . Td 12/28/2006  . Tdap 03/13/2014   Pertinent  Health Maintenance Due  Topic Date Due  . DEXA SCAN  02/09/1984  . PNA vac Low Risk Adult (2 of 2 - PCV13) 12/28/2004  . INFLUENZA VACCINE  07/28/2016   Fall Risk  05/02/2015  Falls in the past year? No  Risk for fall due to  : Impaired balance/gait;Impaired mobility   Functional Status Survey:    Filed Vitals:   07/06/16 1200  BP: 110/60  Pulse: 64  Temp: 98.6 F (37 C)  Resp: 18  Height: 5\' 3"  (1.6 m)  Weight: 131 lb (59.421 kg)   Body mass index is 23.21 kg/(m^2). Physical Exam  Constitutional: She appears well-developed and well-nourished.  HENT:  Head: Normocephalic and atraumatic.  Eyes: Conjunctivae and EOM are normal. Pupils are equal, round, and reactive to light. Right eye exhibits no discharge. Left eye exhibits no discharge.  Neck: Normal range of motion. Neck supple. No JVD present. No thyromegaly present.  Cardiovascular: Normal rate, regular rhythm and normal heart sounds.   No murmur heard. Pulmonary/Chest: Effort normal. She has no wheezes.  She has rales (bibasilar dry).  Abdominal: Soft. Bowel sounds are normal. There is no tenderness.  Musculoskeletal: Normal range of motion. She exhibits no edema or tenderness.  Neurological: She is alert. No cranial nerve deficit. She exhibits normal muscle tone. Coordination normal.  Skin: Skin is warm and dry. No rash noted.  On and off dry skin itching   Psychiatric: Her mood appears anxious. Her affect is labile. Her affect is not angry. Her speech is tangential. Her speech is not slurred. She is agitated (when assisted with perinenal care) and combative (when she doesn't understand what you want her to do). She is not aggressive, not hyperactive and not withdrawn. Thought content is not paranoid and not delusional. Cognition and memory are impaired. She expresses impulsivity and inappropriate judgment. She does not exhibit a depressed mood. She exhibits abnormal recent memory and abnormal remote memory.    Labs reviewed:  Recent Labs  03/31/16  NA 139  K 4.5  BUN 31*  CREATININE 0.8    Recent Labs  03/31/16  AST 15  ALT 13  ALKPHOS 98    Recent Labs  03/31/16  WBC 7.3  HGB 12.9  HCT 38  PLT 349   Lab Results  Component  Value Date   TSH 0.47 03/31/2016   No results found for: HGBA1C No results found for: CHOL, HDL, LDLCALC, LDLDIRECT, TRIG, CHOLHDL  Significant Diagnostic Results in last 30 days:  No results found.  Assessment/Plan  1. Alzheimer's disease of other onset with behavioral disturbance Not currently on any dementia medication. Supportive care is all that is indicated at this time.  2. Essential hypertension controlled

## 2016-07-06 NOTE — Progress Notes (Deleted)
Patient ID: [name], female   DOB: 25-Jun-1917, 82 y.o.   MRN: [id number]  [facility] Room Number: [id number] Place of Service:  SNF (31) Provider:  Estill Dooms, MD  Patient Care Team: Estill Dooms, MD as PCP - General (Internal Medicine) Okay Man Otho Darner, NP as Nurse Practitioner (Nurse Practitioner)  Extended Emergency Contact Information Primary Emergency Contact: [facility] Address: [address]          [place],  [place] Home Phone: [phone] Relation: None  Code Status:  *** Goals of care: Advanced Directive information Advanced Directives 07/06/2016  Does patient have an advance directive? Yes  Type of Advance Directive Living will;Out of facility DNR (pink MOST or yellow form)  Does patient want to make changes to advanced directive? -  Copy of advanced directive(s) in chart? Yes  Pre-existing out of facility DNR order (yellow form or pink MOST form) Yellow form placed in chart (order not valid for inpatient use)     Chief Complaint  Patient presents with  . Medical Management of Chronic Issues    medication management, routine    HPI:  Pt is a 82 y.o. female seen today for medical management of chronic diseases.     Past Medical History  Diagnosis Date  . Malignant neoplasm of colon, unspecified site   . Anxiety state, unspecified   . Alzheimer's disease   . Unspecified essential hypertension   . Chronic airway obstruction, not elsewhere classified   . Barrett's esophagus   . Osteoarthrosis, unspecified whether generalized or localized, unspecified site   . Osteoporosis, unspecified   . Shortness of breath   . Flatulence, eructation, and gas pain   . Urinary frequency   . Hyperglycemia   . Pressure ulcer, heel(707.07)   . Pneumonia, organism unspecified 02/24/2013  . Hypotension, unspecified 02/21/2013  . Hemorrhage of gastrointestinal tract, unspecified 02/21/2013  . Altered mental status 02/21/2013    . Unspecified fall 06/13/2012  . Joint pain of lower extremity 04/15/2012  . Unspecified vitamin D deficiency 05/07/2010  . Altered mental status 04/11/2010  . Closed fracture of distal end of ulna (alone) 04/12  . Disorders of bursae and tendons in shoulder region, unspecified 06/21/2009  . Pain in joint, pelvic region and thigh 04/12/2009  . Anemia, unspecified 04/05/2009  . Hip joint replacement by other means 03/23/2009  . Unspecified constipation 10/09/2008  . Aortic aneurysm of unspecified site without mention of rupture 07/28/2005  . Osteoarthrosis, unspecified whether generalized or localized, unspecified site 07/28/2005  . Osteoporosis, unspecified 07/28/2005  . Other emphysema ([place]) 07/29/1999  . Raynaud's syndrome 07/28/1998   Past Surgical History  Procedure Laterality Date  . Dilation and curettage of uterus    . Colon surgery  2001    partial colon resecton for cancer  . Cataract extraction w/ intraocular lens  implant, bilateral  2004  . Total hip arthroplasty Left 2010    Allergies  Allergen Reactions  . Morphine And Related     Per MAR  . Oxytrol [Oxybutynin]     Per [facility]      Medication List       This list is accurate as of: 07/06/16 12:09 PM.  Always use your most recent med list.               acetaminophen 325 MG tablet  Commonly known as:  TYLENOL  Take 650 mg by mouth 2 (two) times daily. *  Also takes 650mg  every 6 hours as needed*     ergocalciferol 50000 units capsule  Commonly known as:  VITAMIN D2  Take 50,000 Units by mouth every 30 (thirty) days. *Takes on the 19th of each month*     fluticasone 50 MCG/ACT nasal spray  Commonly known as:  FLONASE  Place 2 sprays into both nostrils at bedtime.     guaifenesin 100 MG/5ML syrup  Commonly known as:  ROBITUSSIN  Take 10 mLs by mouth every 6 (six) hours as needed for cough.     lactose free nutrition Liqd  Take 237 mLs by mouth. Drink one can every day at 6 pm     lisinopril 10 MG  tablet  Commonly known as:  PRINIVIL,ZESTRIL  Take 10 mg by mouth daily.     loratadine 10 MG tablet  Commonly known as:  CLARITIN  Take 10 mg by mouth daily as needed for itching.     omeprazole 20 MG capsule  Commonly known as:  PRILOSEC  Take 20 mg by mouth daily.     polyethylene glycol packet  Commonly known as:  MIRALAX / GLYCOLAX  Take 17 g by mouth daily.     polyvinyl alcohol 1.4 % ophthalmic solution  Commonly known as:  LIQUIFILM TEARS  Place 1 drop into both eyes 3 (three) times daily.        Review of Systems  Immunization History  Administered Date(s) Administered  . Influenza Whole 10/24/2012  . Influenza-Unspecified 10/31/2014, 09/18/2015  . Pneumococcal Polysaccharide-23 12/29/2003  . Td 12/28/2006  . Tdap 03/13/2014   Pertinent  Health Maintenance Due  Topic Date Due  . DEXA SCAN  02/09/1984  . PNA vac Low Risk Adult (2 of 2 - PCV13) 12/28/2004  . INFLUENZA VACCINE  07/28/2016   Fall Risk  05/02/2015  Falls in the past year? No  Risk for fall due to : Impaired balance/gait;Impaired mobility   Functional Status Survey:    Filed Vitals:   07/06/16 1200  BP: 110/60  Pulse: 64  Temp: 98.6 F (37 C)  Resp: 18  Height: 5\' 3"  (1.6 m)  Weight: 131 lb (59.421 kg)   Body mass index is 23.21 kg/(m^2). Physical Exam  Labs reviewed:  Recent Labs  03/31/16  NA 139  K 4.5  BUN 31*  CREATININE 0.8    Recent Labs  03/31/16  AST 15  ALT 13  ALKPHOS 98    Recent Labs  03/31/16  WBC 7.3  HGB 12.9  HCT 38  PLT 349   Lab Results  Component Value Date   TSH 0.47 03/31/2016   No results found for: HGBA1C No results found for: CHOL, HDL, LDLCALC, LDLDIRECT, TRIG, CHOLHDL  Significant Diagnostic Results in last 30 days:  No results found.  Assessment/Plan There are no diagnoses linked to this encounter.   Family/ staff Communication: ***  Labs/tests ordered:  ***

## 2016-08-17 ENCOUNTER — Encounter: Payer: Self-pay | Admitting: Nurse Practitioner

## 2016-08-17 ENCOUNTER — Non-Acute Institutional Stay (SKILLED_NURSING_FACILITY): Payer: 59 | Admitting: Nurse Practitioner

## 2016-08-17 DIAGNOSIS — G308 Other Alzheimer's disease: Secondary | ICD-10-CM

## 2016-08-17 DIAGNOSIS — K219 Gastro-esophageal reflux disease without esophagitis: Secondary | ICD-10-CM

## 2016-08-17 DIAGNOSIS — K5900 Constipation, unspecified: Secondary | ICD-10-CM | POA: Diagnosis not present

## 2016-08-17 DIAGNOSIS — I10 Essential (primary) hypertension: Secondary | ICD-10-CM

## 2016-08-17 DIAGNOSIS — F411 Generalized anxiety disorder: Secondary | ICD-10-CM | POA: Diagnosis not present

## 2016-08-17 DIAGNOSIS — M150 Primary generalized (osteo)arthritis: Secondary | ICD-10-CM | POA: Diagnosis not present

## 2016-08-17 DIAGNOSIS — F0280 Dementia in other diseases classified elsewhere without behavioral disturbance: Secondary | ICD-10-CM

## 2016-08-17 DIAGNOSIS — M159 Polyosteoarthritis, unspecified: Secondary | ICD-10-CM

## 2016-08-17 NOTE — Assessment & Plan Note (Signed)
Asymptomatic, continue Omeprazole 20mg daily.  

## 2016-08-17 NOTE — Assessment & Plan Note (Signed)
End stage, off memory preserving meds, total care of ADLs

## 2016-08-17 NOTE — Assessment & Plan Note (Signed)
Stable, continue Celexa 5mg , 03/31/16 TSH 0.47

## 2016-08-17 NOTE — Progress Notes (Signed)
Location:    [place] Room Number: [id number] Place of Service:  SNF (31) Provider:  Marlana Latus  NP   Patient Care Team: Yamina Lenis Otho Darner, NP as PCP - General (Internal Medicine) [facility] Estill Dooms, MD as Consulting Physician (Internal Medicine)  Extended Emergency Contact Information Primary Emergency Contact: [facility] Address: [address]          [place] Home Phone: [phone] Relation: None  Code Status:  DNR Goals of care: Advanced Directive information Advanced Directives 08/17/2016  Does patient have an advance directive? Yes  Type of Advance Directive Living will;Out of facility DNR (pink MOST or yellow form)  Does patient want to make changes to advanced directive? No - Patient declined  Copy of advanced directive(s) in chart? Yes  Pre-existing out of facility DNR order (yellow form or pink MOST form) -     Chief Complaint  Patient presents with  . Medical Management of Chronic Issues    HPI:  Pt is a 82 y.o. female seen today for medical management of chronic diseases.    Hx of Alzheimer's dementia, end stage, total care, off memory preserving meds. Mood has been  stabilized while on Celexa 5mg  and Lorazepam. Regular BM while on MiraLax daily. Blood pressure is controlled on Lisinopril 10mg  daily. Asymptomatic of GERD. Multiple site osteoarthritis is chronic, Tylenol help to ease aches and pains.   Past Medical History:  Diagnosis Date  . Altered mental status 02/21/2013  . Altered mental status 04/11/2010  . Alzheimer's disease   . Anemia, unspecified 04/05/2009  . Anxiety state, unspecified   . Aortic aneurysm of unspecified site without mention of rupture 07/28/2005  . Barrett's esophagus   . Chronic airway obstruction, not elsewhere classified   . Closed fracture of distal end of ulna (alone) 04/12  . Disorders of bursae and tendons in shoulder region, unspecified 06/21/2009  . Flatulence,  eructation, and gas pain   . Hemorrhage of gastrointestinal tract, unspecified 02/21/2013  . Hip joint replacement by other means 03/23/2009  . Hyperglycemia   . Hypotension, unspecified 02/21/2013  . Joint pain of lower extremity 04/15/2012  . Malignant neoplasm of colon, unspecified site   . Osteoarthrosis, unspecified whether generalized or localized, unspecified site   . Osteoarthrosis, unspecified whether generalized or localized, unspecified site 07/28/2005  . Osteoporosis, unspecified   . Osteoporosis, unspecified 07/28/2005  . Other emphysema ([place]) 07/29/1999  . Pain in joint, pelvic region and thigh 04/12/2009  . Pneumonia, organism unspecified 02/24/2013  . Pressure ulcer, heel(707.07)   . Raynaud's syndrome 07/28/1998  . Shortness of breath   . Unspecified constipation 10/09/2008  . Unspecified essential hypertension   . Unspecified fall 06/13/2012  . Unspecified vitamin D deficiency 05/07/2010  . Urinary frequency    Past Surgical History:  Procedure Laterality Date  . CATARACT EXTRACTION W/ INTRAOCULAR LENS  IMPLANT, BILATERAL  2004  . COLON SURGERY  2001   partial colon resecton for cancer  . DILATION AND CURETTAGE OF UTERUS    . TOTAL HIP ARTHROPLASTY Left 2010    Allergies  Allergen Reactions  . Morphine And Related     Per MAR  . Oxytrol [Oxybutynin]     Per [facility]      Medication List       Accurate as of 08/17/16 12:40 PM. Always use your most recent med list.          acetaminophen 325 MG tablet Commonly known  as:  TYLENOL Take 650 mg by mouth 2 (two) times daily. *Also takes 650mg  every 6 hours as needed*   citalopram 10 MG tablet Commonly known as:  CELEXA 5 mg. Take 1/2 tablet by mouth daily.   ergocalciferol 50000 units capsule Commonly known as:  VITAMIN D2 Take 50,000 Units by mouth every 30 (thirty) days. *Takes on the 19th of each month*   guaifenesin 100 MG/5ML syrup Commonly known as:  ROBITUSSIN Take 10 mLs by mouth every 6  (six) hours as needed for cough.   lactose free nutrition Liqd Take 237 mLs by mouth. Drink one can every day at 6 pm   lisinopril 10 MG tablet Commonly known as:  PRINIVIL,ZESTRIL Take 10 mg by mouth daily.   omeprazole 20 MG capsule Commonly known as:  PRILOSEC Take 20 mg by mouth daily.   polyethylene glycol packet Commonly known as:  MIRALAX / GLYCOLAX Take 17 g by mouth daily.   polyvinyl alcohol 1.4 % ophthalmic solution Commonly known as:  LIQUIFILM TEARS Place 1 drop into both eyes 3 (three) times daily.       Review of Systems  Constitutional: Negative for chills and fever.  HENT: Positive for hearing loss. Negative for congestion and ear discharge.   Eyes: Negative for pain and redness.  Respiratory: Negative for cough, shortness of breath and wheezing.   Cardiovascular: Negative for chest pain, palpitations and leg swelling.  Gastrointestinal: Negative for abdominal pain, constipation, diarrhea, nausea and vomiting.  Genitourinary: Positive for frequency. Negative for dysuria and urgency.       Incontinent of bladder  Musculoskeletal: Positive for back pain. Negative for myalgias and neck pain.  Skin: Negative for rash.       Dry skin .  Neurological: Negative for dizziness, tremors, seizures, weakness and headaches.  Psychiatric/Behavioral: Negative for hallucinations and suicidal ideas. The patient is nervous/anxious.     Immunization History  Administered Date(s) Administered  . Influenza Whole 10/24/2012  . Influenza-Unspecified 10/31/2014, 09/18/2015  . Pneumococcal Polysaccharide-23 12/29/2003  . Td 12/28/2006  . Tdap 03/13/2014   Pertinent  Health Maintenance Due  Topic Date Due  . DEXA SCAN  02/09/1984  . PNA vac Low Risk Adult (2 of 2 - PCV13) 12/28/2004  . INFLUENZA VACCINE  07/28/2016   Fall Risk  05/02/2015  Falls in the past year? No  Risk for fall due to : Impaired balance/gait;Impaired mobility   Functional Status Survey:    Vitals:    08/17/16 1043  BP: 122/66  Pulse: 60  Resp: 18  Temp: (!) 96.9 F (36.1 C)  Weight: 130 lb 8 oz (59.2 kg)  Height: 5\' 3"  (1.6 m)   Body mass index is 23.12 kg/m. Physical Exam  Constitutional: She appears well-developed and well-nourished.  HENT:  Head: Normocephalic and atraumatic.  Eyes: Conjunctivae and EOM are normal. Pupils are equal, round, and reactive to light. Right eye exhibits no discharge. Left eye exhibits no discharge.  Neck: Normal range of motion. Neck supple. No JVD present. No thyromegaly present.  Cardiovascular: Normal rate, regular rhythm and normal heart sounds.   No murmur heard. Pulmonary/Chest: Effort normal. She has no wheezes. She has rales (bibasilar dry).  Abdominal: Soft. Bowel sounds are normal. There is no tenderness.  Musculoskeletal: Normal range of motion. She exhibits no edema or tenderness.  Neurological: She is alert. No cranial nerve deficit. She exhibits normal muscle tone. Coordination normal.  Skin: Skin is warm and dry. No rash noted.  On and  off dry skin itching   Psychiatric: Her mood appears anxious. Her affect is labile. Her affect is not angry. Her speech is tangential. Her speech is not slurred. She is agitated (when assisted with perinenal care) and combative (when she doesn't understand what you want her to do). She is not aggressive, not hyperactive and not withdrawn. Thought content is not paranoid and not delusional. Cognition and memory are impaired. She expresses impulsivity and inappropriate judgment. She does not exhibit a depressed mood. She exhibits abnormal recent memory and abnormal remote memory.    Labs reviewed:  Recent Labs  03/31/16  NA 139  K 4.5  BUN 31*  CREATININE 0.8    Recent Labs  03/31/16  AST 15  ALT 13  ALKPHOS 98    Recent Labs  03/31/16  WBC 7.3  HGB 12.9  HCT 38  PLT 349   Lab Results  Component Value Date   TSH 0.47 03/31/2016   No results found for: HGBA1C No results found  for: CHOL, HDL, LDLCALC, LDLDIRECT, TRIG, CHOLHDL  Significant Diagnostic Results in last 30 days:  No results found.  Assessment/Plan There are no diagnoses linked to this encounter.Essential hypertension Controlled, continue Lisinopril 10mg    Constipation Stable, continue MiraLax daily    GERD (gastroesophageal reflux disease) Asymptomatic, continue Omeprazole 20mg  daily.    Alzheimer's disease End stage, off memory preserving meds, total care of ADLs   Anxiety state Stable, continue Celexa 5mg , 03/31/16 TSH 0.47   Osteoarthritis Multiple sites, continueTylenol 650mg  bid and Prn       Family/ staff Communication: continue SNF for care assistance.   Labs/tests ordered:  none

## 2016-08-17 NOTE — Assessment & Plan Note (Signed)
Multiple sites, continueTylenol 650mg bid and Prn 

## 2016-08-17 NOTE — Assessment & Plan Note (Signed)
Controlled, continue Lisinopril 10mg 

## 2016-08-17 NOTE — Assessment & Plan Note (Signed)
Stable, continue MiraLax daily.  

## 2016-08-24 ENCOUNTER — Encounter: Payer: Self-pay | Admitting: Nurse Practitioner

## 2016-08-24 ENCOUNTER — Non-Acute Institutional Stay (SKILLED_NURSING_FACILITY): Payer: 59 | Admitting: Nurse Practitioner

## 2016-08-24 DIAGNOSIS — R627 Adult failure to thrive: Secondary | ICD-10-CM | POA: Diagnosis not present

## 2016-08-24 DIAGNOSIS — K5900 Constipation, unspecified: Secondary | ICD-10-CM

## 2016-08-24 DIAGNOSIS — F0280 Dementia in other diseases classified elsewhere without behavioral disturbance: Secondary | ICD-10-CM | POA: Diagnosis not present

## 2016-08-24 DIAGNOSIS — G308 Other Alzheimer's disease: Secondary | ICD-10-CM | POA: Diagnosis not present

## 2016-08-24 DIAGNOSIS — M159 Polyosteoarthritis, unspecified: Secondary | ICD-10-CM

## 2016-08-24 DIAGNOSIS — M150 Primary generalized (osteo)arthritis: Secondary | ICD-10-CM

## 2016-08-24 DIAGNOSIS — F411 Generalized anxiety disorder: Secondary | ICD-10-CM | POA: Diagnosis not present

## 2016-08-24 DIAGNOSIS — K219 Gastro-esophageal reflux disease without esophagitis: Secondary | ICD-10-CM | POA: Diagnosis not present

## 2016-08-24 DIAGNOSIS — I10 Essential (primary) hypertension: Secondary | ICD-10-CM | POA: Diagnosis not present

## 2016-08-24 NOTE — Assessment & Plan Note (Signed)
Stable, off Celexa 06/01/16, 03/31/16 TSH 0.47

## 2016-08-24 NOTE — Assessment & Plan Note (Signed)
End stage, off memory preserving meds, total care of ADLs

## 2016-08-24 NOTE — Assessment & Plan Note (Signed)
Continue supportive measures, 03/31/16 total protein 7.0, albumin 3.5

## 2016-08-24 NOTE — Progress Notes (Signed)
Location:   [place] Room Number: 16 Place of Service:  SNF (31) Provider:  Aditri Louischarles, [place]  NP  Jeanmarie Hubert, MD  Patient Care Team: Estill Dooms, MD as PCP - General (Internal Medicine) [place] Zyaira Vejar Otho Darner, NP as Nurse Practitioner (Internal Medicine)  Extended Emergency Contact Information Primary Emergency Contact: [facility] Address: [address]          [place]  [place] Home Phone: [phone] Relation: None  Code Status:  DNR Goals of care: Advanced Directive information Advanced Directives 08/24/2016  Does patient have an advance directive? Yes  Type of Advance Directive Living will;Out of facility DNR (pink MOST or yellow form)  Does patient want to make changes to advanced directive? No - Patient declined  Copy of advanced directive(s) in chart? Yes  Pre-existing out of facility DNR order (yellow form or pink MOST form) -     Chief Complaint  Patient presents with  . Acute Visit    Skin tear right lower extremity.    HPI:  Pt is a 82 y.o. female seen today for an acute visit for A small skin tear R lower leg, no s/s of infection. It should heal.    Hx of Alzheimer's dementia, end stage, total care, off memory preserving meds. Mood has been stabilized while on Celexa 5mg  and Lorazepam. Regular BM while on MiraLax daily. Blood pressure is controlled on Lisinopril 10mg  daily. Asymptomatic of GERD. Multiple site osteoarthritis is chronic, Tylenol help to ease aches and pains.  Past Medical History:  Diagnosis Date  . Altered mental status 02/21/2013  . Altered mental status 04/11/2010  . Alzheimer's disease   . Anemia, unspecified 04/05/2009  . Anxiety state, unspecified   . Aortic aneurysm of unspecified site without mention of rupture 07/28/2005  . Barrett's esophagus   . Chronic airway obstruction, not elsewhere classified   . Closed fracture of distal end of ulna (alone) 04/12  . Disorders of bursae and  tendons in shoulder region, unspecified 06/21/2009  . Flatulence, eructation, and gas pain   . Hemorrhage of gastrointestinal tract, unspecified 02/21/2013  . Hip joint replacement by other means 03/23/2009  . Hyperglycemia   . Hypotension, unspecified 02/21/2013  . Joint pain of lower extremity 04/15/2012  . Malignant neoplasm of colon, unspecified site   . Osteoarthrosis, unspecified whether generalized or localized, unspecified site   . Osteoarthrosis, unspecified whether generalized or localized, unspecified site 07/28/2005  . Osteoporosis, unspecified   . Osteoporosis, unspecified 07/28/2005  . Other emphysema ([place]) 07/29/1999  . Pain in joint, pelvic region and thigh 04/12/2009  . Pneumonia, organism unspecified 02/24/2013  . Pressure ulcer, heel(707.07)   . Raynaud's syndrome 07/28/1998  . Shortness of breath   . Unspecified constipation 10/09/2008  . Unspecified essential hypertension   . Unspecified fall 06/13/2012  . Unspecified vitamin D deficiency 05/07/2010  . Urinary frequency    Past Surgical History:  Procedure Laterality Date  . CATARACT EXTRACTION W/ INTRAOCULAR LENS  IMPLANT, BILATERAL  2004  . COLON SURGERY  2001   partial colon resecton for cancer  . DILATION AND CURETTAGE OF UTERUS    . TOTAL HIP ARTHROPLASTY Left 2010    Allergies  Allergen Reactions  . Morphine And Related     Per MAR  . Oxytrol [Oxybutynin]     Per [facility]      Medication List       Accurate as of 08/24/16  4:39 PM. Always use  your most recent med list.          acetaminophen 325 MG tablet Commonly known as:  TYLENOL Take 650 mg by mouth 2 (two) times daily. *Also takes 650mg  every 6 hours as needed*   guaifenesin 100 MG/5ML syrup Commonly known as:  ROBITUSSIN Take 10 mLs by mouth every 6 (six) hours as needed for cough.   lactose free nutrition Liqd Take 237 mLs by mouth. Drink one can every day at 6 pm   lisinopril 10 MG tablet Commonly known as:   PRINIVIL,ZESTRIL Take 10 mg by mouth daily.   omeprazole 20 MG capsule Commonly known as:  PRILOSEC Take 20 mg by mouth daily.   polyethylene glycol packet Commonly known as:  MIRALAX / GLYCOLAX Take 17 g by mouth daily.   polyvinyl alcohol 1.4 % ophthalmic solution Commonly known as:  LIQUIFILM TEARS Place 1 drop into both eyes 3 (three) times daily.   Vitamin D3 50000 units Caps Take 1 capsule by mouth every 30 (thirty) days. On the 19th of the month.       Review of Systems  Constitutional: Negative for chills and fever.  HENT: Positive for hearing loss. Negative for congestion and ear discharge.   Eyes: Negative for pain and redness.  Respiratory: Negative for cough, shortness of breath and wheezing.   Cardiovascular: Negative for chest pain, palpitations and leg swelling.  Gastrointestinal: Negative for abdominal pain, constipation, diarrhea, nausea and vomiting.  Genitourinary: Positive for frequency. Negative for dysuria and urgency.       Incontinent of bladder  Musculoskeletal: Positive for back pain. Negative for myalgias and neck pain.  Skin: Negative for rash.       Dry skin . A small skin tear R lower leg, no s/s of infection.   Neurological: Negative for dizziness, tremors, seizures, weakness and headaches.  Psychiatric/Behavioral: Negative for hallucinations and suicidal ideas. The patient is nervous/anxious.     Immunization History  Administered Date(s) Administered  . Influenza Whole 10/24/2012  . Influenza-Unspecified 10/31/2014, 09/18/2015  . Pneumococcal Polysaccharide-23 12/29/2003  . Td 12/28/2006  . Tdap 03/13/2014   Pertinent  Health Maintenance Due  Topic Date Due  . DEXA SCAN  02/09/1984  . PNA vac Low Risk Adult (2 of 2 - PCV13) 12/28/2004  . INFLUENZA VACCINE  07/28/2016   Fall Risk  05/02/2015  Falls in the past year? No  Risk for fall due to : Impaired balance/gait;Impaired mobility   Functional Status Survey:    Vitals:    08/24/16 1134  BP: 122/68  Pulse: 60  Resp: 20  Temp: 98.6 F (37 C)  Weight: 130 lb 8 oz (59.2 kg)  Height: 5\' 3"  (1.6 m)   Body mass index is 23.12 kg/m. Physical Exam  Constitutional: She appears well-developed and well-nourished.  HENT:  Head: Normocephalic and atraumatic.  Eyes: Conjunctivae and EOM are normal. Pupils are equal, round, and reactive to light. Right eye exhibits no discharge. Left eye exhibits no discharge.  Neck: Normal range of motion. Neck supple. No JVD present. No thyromegaly present.  Cardiovascular: Normal rate, regular rhythm and normal heart sounds.   No murmur heard. Pulmonary/Chest: Effort normal. She has no wheezes. She has rales (bibasilar dry).  Abdominal: Soft. Bowel sounds are normal. There is no tenderness.  Musculoskeletal: Normal range of motion. She exhibits no edema or tenderness.  Neurological: She is alert. No cranial nerve deficit. She exhibits normal muscle tone. Coordination normal.  Skin: Skin is warm and dry.  No rash noted.  On and off dry skin itching. A small skin tear R lower leg, no s/s of infection.    Psychiatric: Her mood appears anxious. Her affect is labile. Her affect is not angry. Her speech is tangential. Her speech is not slurred. She is agitated (when assisted with perinenal care) and combative (when she doesn't understand what you want her to do). She is not aggressive, not hyperactive and not withdrawn. Thought content is not paranoid and not delusional. Cognition and memory are impaired. She expresses impulsivity and inappropriate judgment. She does not exhibit a depressed mood. She exhibits abnormal recent memory and abnormal remote memory.    Labs reviewed:  Recent Labs  03/31/16  NA 139  K 4.5  BUN 31*  CREATININE 0.8    Recent Labs  03/31/16  AST 15  ALT 13  ALKPHOS 98    Recent Labs  03/31/16  WBC 7.3  HGB 12.9  HCT 38  PLT 349   Lab Results  Component Value Date   TSH 0.47 03/31/2016   No  results found for: HGBA1C No results found for: CHOL, HDL, LDLCALC, LDLDIRECT, TRIG, CHOLHDL  Significant Diagnostic Results in last 30 days:  No results found.  Assessment/Plan There are no diagnoses linked to this encounter.Essential hypertension Controlled, continue Lisinopril 10mg     Constipation Stable, continue MiraLax daily    GERD (gastroesophageal reflux disease) Asymptomatic, continue Omeprazole 20mg  daily.     Alzheimer's disease End stage, off memory preserving meds, total care of ADLs    Osteoarthritis Multiple sites, continueTylenol 650mg  bid and Prn   Anxiety state Stable, off Celexa 06/01/16, 03/31/16 TSH 0.47    FTT (failure to thrive) in adult Continue supportive measures, 03/31/16 total protein 7.0, albumin 3.5      Family/ staff Communication: continue SNF for care assistance  Labs/tests ordered:  none

## 2016-08-24 NOTE — Assessment & Plan Note (Signed)
Stable, continue MiraLax daily.  

## 2016-08-24 NOTE — Assessment & Plan Note (Signed)
Controlled, continue Lisinopril 10mg 

## 2016-08-24 NOTE — Assessment & Plan Note (Signed)
Multiple sites, continueTylenol 650mg bid and Prn 

## 2016-08-24 NOTE — Assessment & Plan Note (Signed)
Asymptomatic, continue Omeprazole 20mg daily.  

## 2016-10-02 ENCOUNTER — Non-Acute Institutional Stay (SKILLED_NURSING_FACILITY): Payer: 59 | Admitting: Nurse Practitioner

## 2016-10-02 ENCOUNTER — Encounter: Payer: Self-pay | Admitting: Nurse Practitioner

## 2016-10-02 DIAGNOSIS — I10 Essential (primary) hypertension: Secondary | ICD-10-CM | POA: Diagnosis not present

## 2016-10-02 DIAGNOSIS — K219 Gastro-esophageal reflux disease without esophagitis: Secondary | ICD-10-CM

## 2016-10-02 DIAGNOSIS — G300 Alzheimer's disease with early onset: Secondary | ICD-10-CM

## 2016-10-02 DIAGNOSIS — F0280 Dementia in other diseases classified elsewhere without behavioral disturbance: Secondary | ICD-10-CM

## 2016-10-02 DIAGNOSIS — F411 Generalized anxiety disorder: Secondary | ICD-10-CM

## 2016-10-02 DIAGNOSIS — K5900 Constipation, unspecified: Secondary | ICD-10-CM | POA: Diagnosis not present

## 2016-10-02 DIAGNOSIS — K2270 Barrett's esophagus without dysplasia: Secondary | ICD-10-CM | POA: Diagnosis not present

## 2016-10-02 DIAGNOSIS — M150 Primary generalized (osteo)arthritis: Secondary | ICD-10-CM | POA: Diagnosis not present

## 2016-10-02 DIAGNOSIS — M159 Polyosteoarthritis, unspecified: Secondary | ICD-10-CM

## 2016-10-02 DIAGNOSIS — R627 Adult failure to thrive: Secondary | ICD-10-CM | POA: Diagnosis not present

## 2016-10-02 NOTE — Assessment & Plan Note (Signed)
Stable, off Celexa 06/01/16, 03/31/16 TSH 0.47

## 2016-10-02 NOTE — Assessment & Plan Note (Signed)
Multiple sites, continueTylenol 650mg bid and Prn 

## 2016-10-02 NOTE — Assessment & Plan Note (Signed)
End stage, off memory preserving meds, total care of ADLs

## 2016-10-02 NOTE — Assessment & Plan Note (Signed)
Stable, continue MiraLax daily.  

## 2016-10-02 NOTE — Assessment & Plan Note (Signed)
Controlled, continue Lisinopril 10mg 

## 2016-10-02 NOTE — Assessment & Plan Note (Signed)
Asymptomatic, continue Omeprazole 20mg daily.  

## 2016-10-02 NOTE — Assessment & Plan Note (Signed)
Stable

## 2016-10-02 NOTE — Progress Notes (Signed)
Location:  [place] Room Number: [place] of Service:  SNF (31) Provider:  Marlana Latus, NP  Jeanmarie Hubert, MD  Patient Care Team: Estill Dooms, MD as PCP - General (Internal Medicine) [place] Monick Rena Otho Darner, NP as Nurse Practitioner (Internal Medicine)  Extended Emergency Contact Information Primary Emergency Contact: [facility] Address: [address]          [place]  [place] Home Phone: [phone] Relation: None  Code Status:  DNR Goals of care: Advanced Directive information Advanced Directives 10/02/2016  Does patient have an advance directive? Yes  Type of Advance Directive Living will;Out of facility DNR (pink MOST or yellow form)  Does patient want to make changes to advanced directive? -  Copy of advanced directive(s) in chart? Yes  Pre-existing out of facility DNR order (yellow form or pink MOST form) Yellow form placed in chart (order not valid for inpatient use)     Chief Complaint  Patient presents with  . Medical Management of Chronic Issues    routine    HPI:  Pt is a 82 y.o. female seen today for medical management of chronic diseases.     Hx of Alzheimer's dementia, end stage, total care, off memory preserving meds. Mood has been stabilized while on Celexa 5mg  and Lorazepam. Regular BM while on MiraLax daily. Blood pressure is controlled on Lisinopril 10mg  daily. Asymptomatic of GERD. Multiple site osteoarthritis is chronic, Tylenol help to ease aches and pains. Past Medical History:  Diagnosis Date  . Altered mental status 02/21/2013  . Altered mental status 04/11/2010  . Alzheimer's disease   . Anemia, unspecified 04/05/2009  . Anxiety state, unspecified   . Aortic aneurysm of unspecified site without mention of rupture 07/28/2005  . Barrett's esophagus   . Chronic airway obstruction, not elsewhere classified   . Closed fracture of distal end of ulna (alone) 04/12  . Disorders of bursae and tendons  in shoulder region, unspecified 06/21/2009  . Flatulence, eructation, and gas pain   . Hemorrhage of gastrointestinal tract, unspecified 02/21/2013  . Hip joint replacement by other means 03/23/2009  . Hyperglycemia   . Hypotension, unspecified 02/21/2013  . Joint pain of lower extremity 04/15/2012  . Malignant neoplasm of colon, unspecified site   . Osteoarthrosis, unspecified whether generalized or localized, unspecified site   . Osteoarthrosis, unspecified whether generalized or localized, unspecified site 07/28/2005  . Osteoporosis, unspecified   . Osteoporosis, unspecified 07/28/2005  . Other emphysema ([place]) 07/29/1999  . Pain in joint, pelvic region and thigh 04/12/2009  . Pneumonia, organism unspecified(486) 02/24/2013  . Pressure ulcer, heel(707.07)   . Raynaud's syndrome 07/28/1998  . Shortness of breath   . Unspecified constipation 10/09/2008  . Unspecified essential hypertension   . Unspecified fall 06/13/2012  . Unspecified vitamin D deficiency 05/07/2010  . Urinary frequency    Past Surgical History:  Procedure Laterality Date  . CATARACT EXTRACTION W/ INTRAOCULAR LENS  IMPLANT, BILATERAL  2004  . COLON SURGERY  2001   partial colon resecton for cancer  . DILATION AND CURETTAGE OF UTERUS    . TOTAL HIP ARTHROPLASTY Left 2010    Allergies  Allergen Reactions  . Morphine And Related     Per [facility]  . Oxytrol [Oxybutynin]     Per [facility]      Medication List       Accurate as of 10/02/16  3:44 PM. Always use your most recent med list.  acetaminophen 325 MG tablet Commonly known as:  TYLENOL Take 650 mg by mouth 2 (two) times daily. *Also takes 650mg  every 6 hours as needed*   citalopram 10 MG tablet Commonly known as:  CELEXA Take 10 mg by mouth. Take 1/2 tablet daily   guaifenesin 100 MG/5ML syrup Commonly known as:  ROBITUSSIN Take 10 mLs by mouth every 6 (six) hours as needed for cough.   lactose free nutrition Liqd Take 237 mLs by mouth.  Drink one can every day at 6 pm   lisinopril 10 MG tablet Commonly known as:  PRINIVIL,ZESTRIL Take 10 mg by mouth daily.   omeprazole 20 MG capsule Commonly known as:  PRILOSEC Take 20 mg by mouth daily.   polyethylene glycol packet Commonly known as:  MIRALAX / GLYCOLAX Take 17 g by mouth daily.   polyvinyl alcohol 1.4 % ophthalmic solution Commonly known as:  LIQUIFILM TEARS Place 1 drop into both eyes 3 (three) times daily.   Vitamin D3 50000 units Caps Take 1 capsule by mouth every 30 (thirty) days. On the 19th of the month.       Review of Systems  Constitutional: Negative for chills and fever.  HENT: Positive for hearing loss. Negative for congestion and ear discharge.   Eyes: Negative for pain and redness.  Respiratory: Negative for cough, shortness of breath and wheezing.   Cardiovascular: Negative for chest pain, palpitations and leg swelling.  Gastrointestinal: Negative for abdominal pain, constipation, diarrhea, nausea and vomiting.  Genitourinary: Positive for frequency. Negative for dysuria and urgency.       Incontinent of bladder  Musculoskeletal: Positive for back pain. Negative for myalgias and neck pain.  Skin: Negative for rash.       Dry skin . A small skin tear R lower leg, no s/s of infection.   Neurological: Negative for dizziness, tremors, seizures, weakness and headaches.  Psychiatric/Behavioral: Negative for hallucinations and suicidal ideas. The patient is nervous/anxious.     Immunization History  Administered Date(s) Administered  . Influenza Whole 10/24/2012  . Influenza-Unspecified 10/31/2014, 09/18/2015  . Pneumococcal Polysaccharide-23 12/29/2003  . Td 12/28/2006  . Tdap 03/13/2014   Pertinent  Health Maintenance Due  Topic Date Due  . DEXA SCAN  02/09/1984  . PNA vac Low Risk Adult (2 of 2 - PCV13) 12/28/2004  . INFLUENZA VACCINE  07/28/2016   Fall Risk  05/02/2015  Falls in the past year? No  Risk for fall due to : Impaired  balance/gait;Impaired mobility   Functional Status Survey:    Vitals:   10/02/16 1348  BP: 120/80  Pulse: 68  Resp: 18  Temp: 97 F (36.1 C)  Weight: 132 lb (59.9 kg)  Height: 5\' 3"  (1.6 m)   Body mass index is 23.38 kg/m. Physical Exam  Constitutional: She appears well-developed and well-nourished.  HENT:  Head: Normocephalic and atraumatic.  Eyes: Conjunctivae and EOM are normal. Pupils are equal, round, and reactive to light. Right eye exhibits no discharge. Left eye exhibits no discharge.  Neck: Normal range of motion. Neck supple. No JVD present. No thyromegaly present.  Cardiovascular: Normal rate, regular rhythm and normal heart sounds.   No murmur heard. Pulmonary/Chest: Effort normal. She has no wheezes. She has rales (bibasilar dry).  Abdominal: Soft. Bowel sounds are normal. There is no tenderness.  Musculoskeletal: Normal range of motion. She exhibits no edema or tenderness.  Neurological: She is alert. No cranial nerve deficit. She exhibits normal muscle tone. Coordination normal.  Skin: Skin  is warm and dry. No rash noted.  On and off dry skin itching. A small skin tear R lower leg, no s/s of infection.    Psychiatric: Her mood appears anxious. Her affect is labile. Her affect is not angry. Her speech is tangential. Her speech is not slurred. She is agitated (when assisted with perinenal care) and combative (when she doesn't understand what you want her to do). She is not aggressive, not hyperactive and not withdrawn. Thought content is not paranoid and not delusional. Cognition and memory are impaired. She expresses impulsivity and inappropriate judgment. She does not exhibit a depressed mood. She exhibits abnormal recent memory and abnormal remote memory.    Labs reviewed:  Recent Labs  03/31/16  NA 139  K 4.5  BUN 31*  CREATININE 0.8    Recent Labs  03/31/16  AST 15  ALT 13  ALKPHOS 98    Recent Labs  03/31/16  WBC 7.3  HGB 12.9  HCT 38  PLT  349   Lab Results  Component Value Date   TSH 0.47 03/31/2016   No results found for: HGBA1C No results found for: CHOL, HDL, LDLCALC, LDLDIRECT, TRIG, CHOLHDL  Significant Diagnostic Results in last 30 days:  No results found.  Assessment/Plan Essential hypertension Controlled, continue Lisinopril 10mg     Chronic airway obstruction, not elsewhere classified Stable.   Constipation Stable, continue MiraLax daily    GERD (gastroesophageal reflux disease) Asymptomatic, continue Omeprazole 20mg  daily.     Barrett's esophagus Asymptomatic, continue Omeprazole 20mg  daily.     Alzheimer's disease End stage, off memory preserving meds, total care of ADLs    Osteoarthritis Multiple sites, continueTylenol 650mg  bid and Prn    Anxiety state Stable, off Celexa 06/01/16, 03/31/16 TSH 0.47    FTT (failure to thrive) in adult Continue supportive measures, 03/31/16 total protein 7.0, albumin 3.5       Family/ staff Communication: continue SNF for care assistance.   Labs/tests ordered: none

## 2016-10-02 NOTE — Assessment & Plan Note (Signed)
Continue supportive measures, 03/31/16 total protein 7.0, albumin 3.5

## 2016-11-02 ENCOUNTER — Encounter: Payer: Self-pay | Admitting: Internal Medicine

## 2016-11-02 ENCOUNTER — Non-Acute Institutional Stay (SKILLED_NURSING_FACILITY): Payer: 59 | Admitting: Internal Medicine

## 2016-11-02 DIAGNOSIS — F0280 Dementia in other diseases classified elsewhere without behavioral disturbance: Secondary | ICD-10-CM

## 2016-11-02 DIAGNOSIS — G300 Alzheimer's disease with early onset: Secondary | ICD-10-CM

## 2016-11-02 DIAGNOSIS — I10 Essential (primary) hypertension: Secondary | ICD-10-CM

## 2016-11-02 NOTE — Progress Notes (Signed)
Progress Note     Location:  [place] Room Number: 16 Place of Service:  SNF [phone]) Provider:  Jeanmarie Hubert, MD  Patient Care Team: Estill Dooms, MD as PCP - General (Internal Medicine) [place] Otho Darner, NP as Nurse Practitioner (Internal Medicine)  Extended Emergency Contact Information Primary Emergency Contact: [facility] Address: [address]          [place]  [place] Home Phone: [phone] Relation: None  Code Status:  DNR Goals of care: Advanced Directive information Advanced Directives 11/02/2016  Does patient have an advance directive? Yes  Type of Advance Directive Living will;Out of facility DNR (pink MOST or yellow form)  Does patient want to make changes to advanced directive? No - Patient declined  Copy of advanced directive(s) in chart? Yes  Pre-existing out of facility DNR order (yellow form or pink MOST form) -     Chief Complaint  Patient presents with  . Medical Management of Chronic Issues    HPI:  Pt is a 82 y.o. female seen today for medical management of chronic diseases.    Early onset Alzheimer's dementia without behavioral disturbance - severe and end stage  Essential hypertension - controlled     Past Medical History:  Diagnosis Date  . Altered mental status 02/21/2013  . Altered mental status 04/11/2010  . Alzheimer's disease   . Anemia, unspecified 04/05/2009  . Anxiety state, unspecified   . Aortic aneurysm of unspecified site without mention of rupture 07/28/2005  . Barrett's esophagus   . Chronic airway obstruction, not elsewhere classified   . Closed fracture of distal end of ulna (alone) 04/12  . Disorders of bursae and tendons in shoulder region, unspecified 06/21/2009  . Flatulence, eructation, and gas pain   . Hemorrhage of gastrointestinal tract, unspecified 02/21/2013  . Hip joint replacement by other means 03/23/2009  . Hyperglycemia   . Hypotension, unspecified  02/21/2013  . Joint pain of lower extremity 04/15/2012  . Malignant neoplasm of colon, unspecified site   . Osteoarthrosis, unspecified whether generalized or localized, unspecified site   . Osteoarthrosis, unspecified whether generalized or localized, unspecified site 07/28/2005  . Osteoporosis, unspecified   . Osteoporosis, unspecified 07/28/2005  . Other emphysema ([place]) 07/29/1999  . Pain in joint, pelvic region and thigh 04/12/2009  . Pneumonia, organism unspecified(486) 02/24/2013  . Pressure ulcer, heel(707.07)   . Raynaud's syndrome 07/28/1998  . Shortness of breath   . Unspecified constipation 10/09/2008  . Unspecified essential hypertension   . Unspecified fall 06/13/2012  . Unspecified vitamin D deficiency 05/07/2010  . Urinary frequency    Past Surgical History:  Procedure Laterality Date  . CATARACT EXTRACTION W/ INTRAOCULAR LENS  IMPLANT, BILATERAL  2004  . COLON SURGERY  2001   partial colon resecton for cancer  . DILATION AND CURETTAGE OF UTERUS    . TOTAL HIP ARTHROPLASTY Left 2010    Allergies  Allergen Reactions  . Morphine And Related     Per MAR  . Oxytrol [Oxybutynin]     Per [facility]      Medication List       Accurate as of 11/02/16  1:35 PM. Always use your most recent med list.          acetaminophen 325 MG tablet Commonly known as:  TYLENOL Take 650 mg by mouth 2 (two) times daily. *Also takes 650mg  every 6 hours as needed*   citalopram 10 MG tablet Commonly known as:  CELEXA Take 10 mg by mouth. Take 1/2 tablet daily   guaifenesin 100 MG/5ML syrup Commonly known as:  ROBITUSSIN Take 10 mLs by mouth every 6 (six) hours as needed for cough.   lactose free nutrition Liqd Take 237 mLs by mouth. Drink one can every day at 6 pm   lisinopril 10 MG tablet Commonly known as:  PRINIVIL,ZESTRIL Take 10 mg by mouth daily.   omeprazole 20 MG capsule Commonly known as:  PRILOSEC Take 20 mg by mouth daily.   polyethylene glycol  packet Commonly known as:  MIRALAX / GLYCOLAX Take 17 g by mouth daily.   polyvinyl alcohol 1.4 % ophthalmic solution Commonly known as:  LIQUIFILM TEARS Place 1 drop into both eyes 3 (three) times daily.   Vitamin D3 50000 units Caps Take 1 capsule by mouth every 30 (thirty) days. On the 19th of the month.       Review of Systems  Constitutional: Negative for chills and fever.  HENT: Positive for hearing loss. Negative for congestion and ear discharge.   Eyes: Negative for pain and redness.  Respiratory: Negative for cough, shortness of breath and wheezing.   Cardiovascular: Negative for chest pain, palpitations and leg swelling.  Gastrointestinal: Negative for abdominal pain, constipation, diarrhea, nausea and vomiting.  Genitourinary: Positive for frequency. Negative for dysuria and urgency.       Incontinent of bladder  Musculoskeletal: Positive for back pain. Negative for myalgias and neck pain.  Skin: Negative for rash.       Dry skin .  Neurological: Negative for dizziness, tremors, seizures, weakness and headaches.  Psychiatric/Behavioral: Negative for hallucinations and suicidal ideas. The patient is nervous/anxious.     Immunization History  Administered Date(s) Administered  . Influenza Whole 10/24/2012  . Influenza-Unspecified 10/31/2014, 09/18/2015, 10/13/2016  . Pneumococcal Polysaccharide-23 12/29/2003  . Td 12/28/2006  . Tdap 03/13/2014   Pertinent  Health Maintenance Due  Topic Date Due  . DEXA SCAN  02/09/1984  . PNA vac Low Risk Adult (2 of 2 - PCV13) 12/28/2004  . INFLUENZA VACCINE  Completed   Fall Risk  05/02/2015  Falls in the past year? No  Risk for fall due to : Impaired balance/gait;Impaired mobility    Vitals:   11/02/16 1315  BP: 108/62  Pulse: 74  Resp: 16  Temp: 98.2 F (36.8 C)  Weight: 132 lb 1.6 oz (59.9 kg)  Height: 5\' 3"  (1.6 m)   Body mass index is 23.4 kg/m. Physical Exam  Constitutional: She appears well-developed and  well-nourished.  HENT:  Head: Normocephalic and atraumatic.  Eyes: Conjunctivae and EOM are normal. Pupils are equal, round, and reactive to light. Right eye exhibits no discharge. Left eye exhibits no discharge.  Neck: Normal range of motion. Neck supple. No JVD present. No thyromegaly present.  Cardiovascular: Normal rate, regular rhythm and normal heart sounds.   No murmur heard. Pulmonary/Chest: Effort normal. She has no wheezes. She has rales (bibasilar dry).  Abdominal: Soft. Bowel sounds are normal. There is no tenderness.  Musculoskeletal: Normal range of motion. She exhibits no edema or tenderness.  Neurological: She is alert. No cranial nerve deficit. She exhibits normal muscle tone. Coordination normal.  Skin: Skin is warm and dry. No rash noted.  On and off dry skin itching. A small skin tear R lower leg, no s/s of infection.    Psychiatric: Her mood appears anxious. Her affect is labile. Her affect is not angry. Her speech is tangential. Her speech is not  slurred. She is agitated (when assisted with perinenal care) and combative (when she doesn't understand what you want her to do). She is not aggressive, not hyperactive and not withdrawn. Thought content is not paranoid and not delusional. Cognition and memory are impaired. She expresses impulsivity and inappropriate judgment. She does not exhibit a depressed mood. She exhibits abnormal recent memory and abnormal remote memory.    Labs reviewed:  Recent Labs  03/31/16  NA 139  K 4.5  BUN 31*  CREATININE 0.8    Recent Labs  03/31/16  AST 15  ALT 13  ALKPHOS 98    Recent Labs  03/31/16  WBC 7.3  HGB 12.9  HCT 38  PLT 349   Lab Results  Component Value Date   TSH 0.47 03/31/2016    Assessment/Plan 1. Early onset Alzheimer's dementia without behavioral disturbance End stage  2. Essential hypertension controlled

## 2016-11-06 ENCOUNTER — Encounter: Payer: Self-pay | Admitting: Internal Medicine

## 2016-12-03 ENCOUNTER — Non-Acute Institutional Stay (SKILLED_NURSING_FACILITY): Payer: 59 | Admitting: Nurse Practitioner

## 2016-12-03 ENCOUNTER — Encounter: Payer: Self-pay | Admitting: Nurse Practitioner

## 2016-12-03 DIAGNOSIS — S91219A Laceration without foreign body of unspecified toe(s) with damage to nail, initial encounter: Secondary | ICD-10-CM | POA: Diagnosis not present

## 2016-12-03 DIAGNOSIS — K219 Gastro-esophageal reflux disease without esophagitis: Secondary | ICD-10-CM | POA: Diagnosis not present

## 2016-12-03 DIAGNOSIS — F411 Generalized anxiety disorder: Secondary | ICD-10-CM

## 2016-12-03 DIAGNOSIS — F0280 Dementia in other diseases classified elsewhere without behavioral disturbance: Secondary | ICD-10-CM | POA: Diagnosis not present

## 2016-12-03 DIAGNOSIS — I10 Essential (primary) hypertension: Secondary | ICD-10-CM | POA: Diagnosis not present

## 2016-12-03 DIAGNOSIS — G300 Alzheimer's disease with early onset: Secondary | ICD-10-CM | POA: Diagnosis not present

## 2016-12-03 DIAGNOSIS — K5900 Constipation, unspecified: Secondary | ICD-10-CM

## 2016-12-03 DIAGNOSIS — R627 Adult failure to thrive: Secondary | ICD-10-CM | POA: Diagnosis not present

## 2016-12-03 NOTE — Assessment & Plan Note (Signed)
Persists, continue supportive care, update CBC CMP TSH

## 2016-12-03 NOTE — Assessment & Plan Note (Signed)
Stable, continue MiraLax daily.  

## 2016-12-03 NOTE — Assessment & Plan Note (Signed)
Controlled, continue Lisinopril 10mg 

## 2016-12-03 NOTE — Assessment & Plan Note (Signed)
The right 3rd toe nail missing, irritated, no s/s of infection, apply Silvadene cream bid until healed.

## 2016-12-03 NOTE — Assessment & Plan Note (Signed)
Asymptomatic, continue Omeprazole 20mg daily.  

## 2016-12-03 NOTE — Assessment & Plan Note (Signed)
Failed off Celexa. Presently taking Celexa 10mg daily 

## 2016-12-03 NOTE — Assessment & Plan Note (Signed)
End stage, off memory preserving meds, total care of ADLs

## 2016-12-03 NOTE — Progress Notes (Signed)
Location:  [place] Room Number: 16 Place of Service:  SNF (31) Provider:  Malina Geers, [place]  NP  Jeanmarie Hubert, MD  Patient Care Team: Estill Dooms, MD as PCP - General (Internal Medicine) [place] Mattea Seger Otho Darner, NP as Nurse Practitioner (Internal Medicine)  Extended Emergency Contact Information Primary Emergency Contact: [facility] Address: [address]          [place]  [place] Home Phone: [phone] Relation: None  Code Status:  DNR Goals of care: Advanced Directive information Advanced Directives 12/03/2016  Does Patient Have a Medical Advance Directive? Yes  Type of Advance Directive Living will;Out of facility DNR (pink MOST or yellow form)  Does patient want to make changes to medical advance directive? No - Patient declined  Copy of [place] in Chart? Yes  Pre-existing out of facility DNR order (yellow form or pink MOST form) -     Chief Complaint  Patient presents with  . Acute Visit    (R) 3rd toe nail is missing, some clear drainage, sensitive to touch    HPI:  [name] is a 82 y.o. female seen today for an acute visit for the right 3rd toe nail missing, irritated, but no s/s of infection.    Hx of Alzheimer's dementia, end stage, total care, off memory preserving meds. Mood has been stabilized while on Celexa 10mg  and Lorazepam. Regular BM while on MiraLax daily. Blood pressure is controlled on Lisinopril 10mg  daily. Asymptomatic of GERD. Multiple site osteoarthritis is chronic, Tylenol help to ease aches and pains.  Past Medical History:  Diagnosis Date  . Altered mental status 02/21/2013  . Altered mental status 04/11/2010  . Alzheimer's disease   . Anemia, unspecified 04/05/2009  . Anxiety state, unspecified   . Aortic aneurysm of unspecified site without mention of rupture 07/28/2005  . Barrett's esophagus   . Chronic airway obstruction, not elsewhere classified   . Closed fracture of distal  end of ulna (alone) 04/12  . Disorders of bursae and tendons in shoulder region, unspecified 06/21/2009  . Flatulence, eructation, and gas pain   . Hemorrhage of gastrointestinal tract, unspecified 02/21/2013  . Hip joint replacement by other means 03/23/2009  . Hyperglycemia   . Hypotension, unspecified 02/21/2013  . Joint pain of lower extremity 04/15/2012  . Malignant neoplasm of colon, unspecified site   . Osteoarthrosis, unspecified whether generalized or localized, unspecified site   . Osteoarthrosis, unspecified whether generalized or localized, unspecified site 07/28/2005  . Osteoporosis, unspecified   . Osteoporosis, unspecified 07/28/2005  . Other emphysema ([place]) 07/29/1999  . Pain in joint, pelvic region and thigh 04/12/2009  . Pneumonia, organism unspecified(486) 02/24/2013  . Pressure ulcer, heel(707.07)   . Raynaud's syndrome 07/28/1998  . Shortness of breath   . Unspecified constipation 10/09/2008  . Unspecified essential hypertension   . Unspecified fall 06/13/2012  . Unspecified vitamin D deficiency 05/07/2010  . Urinary frequency    Past Surgical History:  Procedure Laterality Date  . CATARACT EXTRACTION W/ INTRAOCULAR LENS  IMPLANT, BILATERAL  2004  . COLON SURGERY  2001   partial colon resecton for cancer  . DILATION AND CURETTAGE OF UTERUS    . TOTAL HIP ARTHROPLASTY Left 2010    Allergies  Allergen Reactions  . Morphine And Related     Per MAR  . Oxytrol [Oxybutynin]     Per [facility]      Medication List       Accurate as  of 12/03/16  2:31 PM. Always use your most recent med list.          acetaminophen 325 MG tablet Commonly known as:  TYLENOL Take 650 mg by mouth 2 (two) times daily. *Also takes 650mg  every 6 hours as needed*   citalopram 10 MG tablet Commonly known as:  CELEXA Take 10 mg by mouth. Take 1/2 tablet daily   guaifenesin 100 MG/5ML syrup Commonly known as:  ROBITUSSIN Take 10 mLs by mouth every 6 (six) hours as needed for  cough.   lactose free nutrition Liqd Take 237 mLs by mouth. Drink one can every day at 6 pm   lisinopril 10 MG tablet Commonly known as:  PRINIVIL,ZESTRIL Take 10 mg by mouth daily.   omeprazole 20 MG capsule Commonly known as:  PRILOSEC Take 20 mg by mouth daily.   polyethylene glycol packet Commonly known as:  MIRALAX / GLYCOLAX Take 17 g by mouth daily.   polyvinyl alcohol 1.4 % ophthalmic solution Commonly known as:  LIQUIFILM TEARS Place 1 drop into both eyes 3 (three) times daily.   Vitamin D3 50000 units Caps Take 1 capsule by mouth every 30 (thirty) days. On the 19th of the month.       Review of Systems  Constitutional: Negative for chills and fever.  HENT: Positive for hearing loss. Negative for congestion and ear discharge.   Eyes: Negative for pain and redness.  Respiratory: Negative for cough, shortness of breath and wheezing.   Cardiovascular: Negative for chest pain, palpitations and leg swelling.  Gastrointestinal: Negative for abdominal pain, constipation, diarrhea, nausea and vomiting.  Genitourinary: Positive for frequency. Negative for dysuria and urgency.       Incontinent of bladder  Musculoskeletal: Positive for back pain. Negative for myalgias and neck pain.  Skin: Negative for rash.       Dry skin .  Neurological: Negative for dizziness, tremors, seizures, weakness and headaches.  Psychiatric/Behavioral: Negative for hallucinations and suicidal ideas. The patient is nervous/anxious.     Immunization History  Administered Date(s) Administered  . Influenza Whole 10/24/2012  . Influenza-Unspecified 10/31/2014, 09/18/2015, 10/13/2016  . Pneumococcal Polysaccharide-23 12/29/2003  . Td 12/28/2006  . Tdap 03/13/2014   Pertinent  Health Maintenance Due  Topic Date Due  . DEXA SCAN  02/09/1984  . PNA vac Low Risk Adult (2 of 2 - PCV13) 12/28/2004  . INFLUENZA VACCINE  Completed   Fall Risk  05/02/2015  Falls in the past year? No  Risk for fall  due to : Impaired balance/gait;Impaired mobility   Functional Status Survey:    Vitals:   12/03/16 1321  BP: 105/60  Pulse: 74  Resp: 18  Temp: 97.2 F (36.2 C)  Weight: 128 lb 6.4 oz (58.2 kg)  Height: 5\' 3"  (1.6 m)   Body mass index is 22.75 kg/m. Physical Exam  Constitutional: She appears well-developed and well-nourished.  HENT:  Head: Normocephalic and atraumatic.  Eyes: Conjunctivae and EOM are normal. Pupils are equal, round, and reactive to light. Right eye exhibits no discharge. Left eye exhibits no discharge.  Neck: Normal range of motion. Neck supple. No JVD present. No thyromegaly present.  Cardiovascular: Normal rate, regular rhythm and normal heart sounds.   No murmur heard. Pulmonary/Chest: Effort normal. She has no wheezes. She has rales (bibasilar dry).  Abdominal: Soft. Bowel sounds are normal. There is no tenderness.  Musculoskeletal: Normal range of motion. She exhibits no edema or tenderness.  Neurological: She is alert. No cranial  nerve deficit. She exhibits normal muscle tone. Coordination normal.  Skin: Skin is warm and dry. No rash noted.  On and off dry skin itching. The right 3rd toe nail missing, clear drainage, sensitive, no s/s of infection.    Psychiatric: Her mood appears anxious. Her affect is labile. Her affect is not angry. Her speech is tangential. Her speech is not slurred. She is agitated (when assisted with perinenal care) and combative (when she doesn't understand what you want her to do). She is not aggressive, not hyperactive and not withdrawn. Thought content is not paranoid and not delusional. Cognition and memory are impaired. She expresses impulsivity and inappropriate judgment. She does not exhibit a depressed mood. She exhibits abnormal recent memory and abnormal remote memory.    Labs reviewed:  Recent Labs  03/31/16  NA 139  K 4.5  BUN 31*  CREATININE 0.8    Recent Labs  03/31/16  AST 15  ALT 13  ALKPHOS 98     Recent Labs  03/31/16  WBC 7.3  HGB 12.9  HCT 38  PLT 349   Lab Results  Component Value Date   TSH 0.47 03/31/2016   No results found for: HGBA1C No results found for: CHOL, HDL, LDLCALC, LDLDIRECT, TRIG, CHOLHDL  Significant Diagnostic Results in last 30 days:  No results found.  Assessment/Plan Laceration of nail bed of toe The right 3rd toe nail missing, irritated, no s/s of infection, apply Silvadene cream bid until healed.   FTT (failure to thrive) in adult Persists, continue supportive care, update CBC CMP TSH  Essential hypertension Controlled, continue Lisinopril 10mg     Constipation Stable, continue MiraLax daily  GERD (gastroesophageal reflux disease) Asymptomatic, continue Omeprazole 20mg  daily.     Alzheimer's disease End stage, off memory preserving meds, total care of ADLs  Anxiety state Failed off Celexa. Presently taking Celexa 10mg  daily     Family/ staff Communication: SNF  Labs/tests ordered:  CBC CMP TSH

## 2017-01-25 ENCOUNTER — Non-Acute Institutional Stay (SKILLED_NURSING_FACILITY): Payer: Medicare Other | Admitting: Nurse Practitioner

## 2017-01-25 ENCOUNTER — Encounter: Payer: Self-pay | Admitting: Nurse Practitioner

## 2017-01-25 DIAGNOSIS — I10 Essential (primary) hypertension: Secondary | ICD-10-CM | POA: Diagnosis not present

## 2017-01-25 DIAGNOSIS — F0280 Dementia in other diseases classified elsewhere without behavioral disturbance: Secondary | ICD-10-CM

## 2017-01-25 DIAGNOSIS — G300 Alzheimer's disease with early onset: Secondary | ICD-10-CM | POA: Diagnosis not present

## 2017-01-25 DIAGNOSIS — K5900 Constipation, unspecified: Secondary | ICD-10-CM | POA: Diagnosis not present

## 2017-01-25 DIAGNOSIS — K219 Gastro-esophageal reflux disease without esophagitis: Secondary | ICD-10-CM

## 2017-01-25 DIAGNOSIS — R627 Adult failure to thrive: Secondary | ICD-10-CM

## 2017-01-25 DIAGNOSIS — M150 Primary generalized (osteo)arthritis: Secondary | ICD-10-CM

## 2017-01-25 DIAGNOSIS — M159 Polyosteoarthritis, unspecified: Secondary | ICD-10-CM

## 2017-01-25 NOTE — Assessment & Plan Note (Signed)
Persists, continue supportive care 

## 2017-01-25 NOTE — Assessment & Plan Note (Signed)
Controlled, continue Lisinopril 10mg 

## 2017-01-25 NOTE — Assessment & Plan Note (Signed)
End stage, off memory preserving meds, total care of ADLs

## 2017-01-25 NOTE — Assessment & Plan Note (Signed)
Multiple sites, continueTylenol 650mg bid and Prn 

## 2017-01-25 NOTE — Assessment & Plan Note (Signed)
Stable, continue MiraLax daily.  

## 2017-01-25 NOTE — Assessment & Plan Note (Signed)
Asymptomatic, continue Omeprazole 20mg daily.  

## 2017-01-25 NOTE — Progress Notes (Signed)
Location:  [place] Room Number: 16 Place of Service:  SNF (31) Provider:  Maisie Hauser, [place]  NP  Jeanmarie Hubert, MD  Patient Care Team: Estill Dooms, MD as PCP - General (Internal Medicine) [place] Jahmez Bily Otho Darner, NP as Nurse Practitioner (Internal Medicine)  Extended Emergency Contact Information Primary Emergency Contact: [facility] Address: [address]          [place]  [place] Home Phone: [phone] Relation: None  Code Status:  DNR Goals of care: Advanced Directive information Advanced Directives 01/25/2017  Does Patient Have a Medical Advance Directive? Yes  Type of Advance Directive Living will;Out of facility DNR (pink MOST or yellow form)  Does patient want to make changes to medical advance directive? No - Patient declined  Copy of [place] in Chart? -  Pre-existing out of facility DNR order (yellow form or pink MOST form) Yellow form placed in chart (order not valid for inpatient use)     Chief Complaint  Patient presents with  . Medical Management of Chronic Issues    HPI:  Pt is a 83 y.o. female seen today for medical management of chronic diseases.     Hx of Alzheimer's dementia, end stage, total care, off memory preserving meds. Mood has been stabilized while on Celexa 10mg  and Lorazepam. Regular BM while on MiraLax daily. Blood pressure is controlled on Lisinopril 10mg  daily. Asymptomatic of GERD. Multiple site osteoarthritis is chronic, Tylenol help to ease aches and pains.  Past Medical History:  Diagnosis Date  . Altered mental status 02/21/2013  . Altered mental status 04/11/2010  . Alzheimer's disease   . Anemia, unspecified 04/05/2009  . Anxiety state, unspecified   . Aortic aneurysm of unspecified site without mention of rupture 07/28/2005  . Barrett's esophagus   . Chronic airway obstruction, not elsewhere classified   . Closed fracture of distal end of ulna (alone) 04/12  .  Disorders of bursae and tendons in shoulder region, unspecified 06/21/2009  . Flatulence, eructation, and gas pain   . Hemorrhage of gastrointestinal tract, unspecified 02/21/2013  . Hip joint replacement by other means 03/23/2009  . Hyperglycemia   . Hypotension, unspecified 02/21/2013  . Joint pain of lower extremity 04/15/2012  . Malignant neoplasm of colon, unspecified site   . Osteoarthrosis, unspecified whether generalized or localized, unspecified site   . Osteoarthrosis, unspecified whether generalized or localized, unspecified site 07/28/2005  . Osteoporosis, unspecified   . Osteoporosis, unspecified 07/28/2005  . Other emphysema ([place]) 07/29/1999  . Pain in joint, pelvic region and thigh 04/12/2009  . Pneumonia, organism unspecified(486) 02/24/2013  . Pressure ulcer, heel(707.07)   . Raynaud's syndrome 07/28/1998  . Shortness of breath   . Unspecified constipation 10/09/2008  . Unspecified essential hypertension   . Unspecified fall 06/13/2012  . Unspecified vitamin D deficiency 05/07/2010  . Urinary frequency    Past Surgical History:  Procedure Laterality Date  . CATARACT EXTRACTION W/ INTRAOCULAR LENS  IMPLANT, BILATERAL  2004  . COLON SURGERY  2001   partial colon resecton for cancer  . DILATION AND CURETTAGE OF UTERUS    . TOTAL HIP ARTHROPLASTY Left 2010    Allergies  Allergen Reactions  . Morphine And Related     Per MAR  . Oxytrol [Oxybutynin]     Per MAR    Allergies as of 01/25/2017      Reactions   Morphine And Related    Per MAR   Oxytrol [oxybutynin]  Per [facility]      Medication List       Accurate as of 01/25/17  3:10 PM. Always use your most recent med list.          acetaminophen 325 MG tablet Commonly known as:  TYLENOL Take 650 mg by mouth 2 (two) times daily. *Also takes 650mg  every 6 hours as needed*   citalopram 10 MG tablet Commonly known as:  CELEXA Take 10 mg by mouth. Take 1/2 tablet daily   guaifenesin 100 MG/5ML  syrup Commonly known as:  ROBITUSSIN Take 10 mLs by mouth every 6 (six) hours as needed for cough.   lactose free nutrition Liqd Take 237 mLs by mouth. Drink one can every day at 6 pm   lisinopril 10 MG tablet Commonly known as:  PRINIVIL,ZESTRIL Take 10 mg by mouth daily.   loratadine 10 MG tablet Commonly known as:  CLARITIN Take 10 mg by mouth daily as needed for allergies.   omeprazole 20 MG capsule Commonly known as:  PRILOSEC Take 20 mg by mouth daily.   polyethylene glycol packet Commonly known as:  MIRALAX / GLYCOLAX Take 17 g by mouth daily.   polyvinyl alcohol 1.4 % ophthalmic solution Commonly known as:  LIQUIFILM TEARS Place 1 drop into both eyes 3 (three) times daily.   Vitamin D3 50000 units Caps Take 1 capsule by mouth every 30 (thirty) days. On the 19th of the month.       Review of Systems  Constitutional: Negative for chills and fever.  HENT: Positive for hearing loss. Negative for congestion and ear discharge.   Eyes: Negative for pain and redness.  Respiratory: Negative for cough, shortness of breath and wheezing.   Cardiovascular: Negative for chest pain, palpitations and leg swelling.  Gastrointestinal: Negative for abdominal pain, constipation, diarrhea, nausea and vomiting.  Genitourinary: Positive for frequency. Negative for dysuria and urgency.       Incontinent of bladder  Musculoskeletal: Positive for back pain. Negative for myalgias and neck pain.  Skin: Negative for rash.       Dry skin .  Neurological: Negative for dizziness, tremors, seizures, weakness and headaches.  Psychiatric/Behavioral: Negative for hallucinations and suicidal ideas. The patient is nervous/anxious.     Immunization History  Administered Date(s) Administered  . Influenza Whole 10/24/2012  . Influenza-Unspecified 10/31/2014, 09/18/2015, 10/13/2016  . Pneumococcal Polysaccharide-23 12/29/2003  . Td 12/28/2006  . Tdap 03/13/2014   Pertinent  Health Maintenance  Due  Topic Date Due  . DEXA SCAN  02/09/1984  . PNA vac Low Risk Adult (2 of 2 - PCV13) 12/28/2004  . INFLUENZA VACCINE  Completed   Fall Risk  05/02/2015  Falls in the past year? No  Risk for fall due to : Impaired balance/gait;Impaired mobility   Functional Status Survey:    Vitals:   01/25/17 1342  BP: 110/68  Pulse: 70  Resp: 18  Temp: 97 F (36.1 C)  Weight: 126 lb 1.6 oz (57.2 kg)  Height: 5\' 3"  (1.6 m)   Body mass index is 22.34 kg/m. Physical Exam  Constitutional: She appears well-developed and well-nourished.  HENT:  Head: Normocephalic and atraumatic.  Eyes: Conjunctivae and EOM are normal. Pupils are equal, round, and reactive to light. Right eye exhibits no discharge. Left eye exhibits no discharge.  Neck: Normal range of motion. Neck supple. No JVD present. No thyromegaly present.  Cardiovascular: Normal rate, regular rhythm and normal heart sounds.   No murmur heard. Pulmonary/Chest: Effort normal. She  has no wheezes. She has rales (bibasilar dry).  Abdominal: Soft. Bowel sounds are normal. There is no tenderness.  Musculoskeletal: Normal range of motion. She exhibits no edema or tenderness.  Neurological: She is alert. No cranial nerve deficit. She exhibits normal muscle tone. Coordination normal.  Skin: Skin is warm and dry. No rash noted.  On and off dry skin itching.   Psychiatric: Her mood appears anxious. Her affect is labile. Her affect is not angry. Her speech is tangential. Her speech is not slurred. She is agitated (when assisted with perinenal care) and combative (when she doesn't understand what you want her to do). She is not aggressive, not hyperactive and not withdrawn. Thought content is not paranoid and not delusional. Cognition and memory are impaired. She expresses impulsivity and inappropriate judgment. She does not exhibit a depressed mood. She exhibits abnormal recent memory and abnormal remote memory.    Labs reviewed:  Recent Labs   03/31/16  NA 139  K 4.5  BUN 31*  CREATININE 0.8    Recent Labs  03/31/16  AST 15  ALT 13  ALKPHOS 98    Recent Labs  03/31/16  WBC 7.3  HGB 12.9  HCT 38  PLT 349   Lab Results  Component Value Date   TSH 0.47 03/31/2016   No results found for: HGBA1C No results found for: CHOL, HDL, LDLCALC, LDLDIRECT, TRIG, CHOLHDL  Significant Diagnostic Results in last 30 days:  No results found.  Assessment/Plan Essential hypertension Controlled, continue Lisinopril 10mg     Constipation Stable, continue MiraLax daily  GERD (gastroesophageal reflux disease) Asymptomatic, continue Omeprazole 20mg  daily.     Alzheimer's disease End stage, off memory preserving meds, total care of ADLs  Osteoarthritis Multiple sites, continueTylenol 650mg  bid and Prn    FTT (failure to thrive) in adult Persists, continue supportive care     Family/ staff Communication: SNF  Labs/tests ordered:  none

## 2017-02-19 ENCOUNTER — Non-Acute Institutional Stay (SKILLED_NURSING_FACILITY): Payer: Medicare Other | Admitting: Internal Medicine

## 2017-02-19 ENCOUNTER — Encounter: Payer: Self-pay | Admitting: Internal Medicine

## 2017-02-19 DIAGNOSIS — G309 Alzheimer's disease, unspecified: Secondary | ICD-10-CM

## 2017-02-19 DIAGNOSIS — R627 Adult failure to thrive: Secondary | ICD-10-CM | POA: Diagnosis not present

## 2017-02-19 DIAGNOSIS — I10 Essential (primary) hypertension: Secondary | ICD-10-CM

## 2017-02-19 DIAGNOSIS — F0280 Dementia in other diseases classified elsewhere without behavioral disturbance: Secondary | ICD-10-CM | POA: Diagnosis not present

## 2017-02-19 NOTE — Progress Notes (Signed)
Progress Note     Location:  [place] Room Number: [place] of Service:  SNF [phone]) Provider:  Jeanmarie Hubert, MD  Patient Care Team: Estill Dooms, MD as PCP - General (Internal Medicine) [place] Otho Darner, NP as Nurse Practitioner (Internal Medicine)  Extended Emergency Contact Information Primary Emergency Contact: [facility] Address: [address]          [place]  [place] Home Phone: [phone] Relation: None  Code Status:  DNR Goals of care: Advanced Directive information Advanced Directives 02/19/2017  Does Patient Have a Medical Advance Directive? Yes  Type of Advance Directive Living will;Out of facility DNR (pink MOST or yellow form)  Does patient want to make changes to medical advance directive? -  Copy of [place] in Chart? -  Pre-existing out of facility DNR order (yellow form or pink MOST form) Yellow form placed in chart (order not valid for inpatient use)     Chief Complaint  Patient presents with  . Medical Management of Chronic Issues    routine visit    HPI:  Pt is a 83 y.o. female seen today for medical management of chronic diseases.    Alzheimer's dementia without behavioral disturbance - sleeps most of the time. Full assist for all ADL.  Essential hypertension - running on the ow side  FTT (failure to thrive) in adult - totally dependent on others. Poorly communcative.    Past Medical History:  Diagnosis Date  . Altered mental status 02/21/2013  . Altered mental status 04/11/2010  . Alzheimer's disease   . Anemia, unspecified 04/05/2009  . Anxiety state, unspecified   . Aortic aneurysm of unspecified site without mention of rupture 07/28/2005  . Barrett's esophagus   . Chronic airway obstruction, not elsewhere classified   . Closed fracture of distal end of ulna (alone) 04/12  . Disorders of bursae and tendons in shoulder region, unspecified 06/21/2009  .  Flatulence, eructation, and gas pain   . Hemorrhage of gastrointestinal tract, unspecified 02/21/2013  . Hip joint replacement by other means 03/23/2009  . Hyperglycemia   . Hypotension, unspecified 02/21/2013  . Joint pain of lower extremity 04/15/2012  . Malignant neoplasm of colon, unspecified site   . Osteoarthrosis, unspecified whether generalized or localized, unspecified site   . Osteoarthrosis, unspecified whether generalized or localized, unspecified site 07/28/2005  . Osteoporosis, unspecified   . Osteoporosis, unspecified 07/28/2005  . Other emphysema ([place]) 07/29/1999  . Pain in joint, pelvic region and thigh 04/12/2009  . Pneumonia, organism unspecified(486) 02/24/2013  . Pressure ulcer, heel(707.07)   . Raynaud's syndrome 07/28/1998  . Shortness of breath   . Unspecified constipation 10/09/2008  . Unspecified essential hypertension   . Unspecified fall 06/13/2012  . Unspecified vitamin D deficiency 05/07/2010  . Urinary frequency    Past Surgical History:  Procedure Laterality Date  . CATARACT EXTRACTION W/ INTRAOCULAR LENS  IMPLANT, BILATERAL  2004  . COLON SURGERY  2001   partial colon resecton for cancer  . DILATION AND CURETTAGE OF UTERUS    . TOTAL HIP ARTHROPLASTY Left 2010    Allergies  Allergen Reactions  . Morphine And Related     Per MAR  . Oxytrol [Oxybutynin]     Per MAR    Allergies as of 02/19/2017      Reactions   Morphine And Related    Per MAR   Oxytrol [oxybutynin]    Per [facility]  Medication List       Accurate as of 02/19/17 12:09 PM. Always use your most recent med list.          acetaminophen 325 MG tablet Commonly known as:  TYLENOL Take 650 mg by mouth 2 (two) times daily. *Also takes 650mg  every 6 hours as needed*   citalopram 10 MG tablet Commonly known as:  CELEXA Take 10 mg by mouth. Take 1/2 tablet daily   guaifenesin 100 MG/5ML syrup Commonly known as:  ROBITUSSIN Take 10 mLs by mouth every 6 (six) hours as  needed for cough.   lactose free nutrition Liqd Take 237 mLs by mouth. Drink one can every day at 6 pm   lisinopril 10 MG tablet Commonly known as:  PRINIVIL,ZESTRIL Take 10 mg by mouth daily.   loratadine 10 MG tablet Commonly known as:  CLARITIN Take 10 mg by mouth daily as needed for allergies.   omeprazole 20 MG capsule Commonly known as:  PRILOSEC Take 20 mg by mouth daily.   polyethylene glycol packet Commonly known as:  MIRALAX / GLYCOLAX Take 17 g by mouth daily.   polyvinyl alcohol 1.4 % ophthalmic solution Commonly known as:  LIQUIFILM TEARS Place 1 drop into both eyes 3 (three) times daily.   Vitamin D3 50000 units Caps Take 1 capsule by mouth every 30 (thirty) days. On the 19th of the month.       Review of Systems  Constitutional: Negative for chills and fever.  HENT: Positive for hearing loss. Negative for congestion and ear discharge.   Eyes: Negative for pain and redness.  Respiratory: Negative for cough, shortness of breath and wheezing.   Cardiovascular: Negative for chest pain, palpitations and leg swelling.  Gastrointestinal: Negative for abdominal pain, constipation, diarrhea, nausea and vomiting.  Genitourinary: Positive for frequency. Negative for dysuria and urgency.       Incontinent of bladder  Musculoskeletal: Positive for back pain. Negative for myalgias and neck pain.  Skin: Negative for rash.       Dry skin .  Neurological: Negative for dizziness, tremors, seizures, weakness and headaches.  Psychiatric/Behavioral: Negative for hallucinations and suicidal ideas. The patient is nervous/anxious.     Immunization History  Administered Date(s) Administered  . Influenza Whole 10/24/2012  . Influenza-Unspecified 10/31/2014, 09/18/2015, 10/13/2016  . Pneumococcal Polysaccharide-23 12/29/2003  . Td 12/28/2006  . Tdap 03/13/2014   Pertinent  Health Maintenance Due  Topic Date Due  . DEXA SCAN  02/09/1984  . PNA vac Low Risk Adult (2 of 2 -  PCV13) 12/28/2004  . INFLUENZA VACCINE  Completed   Fall Risk  05/02/2015  Falls in the past year? No  Risk for fall due to : Impaired balance/gait;Impaired mobility     Vitals:   02/19/17 1203  BP: (!) 96/50  Pulse: 60  Resp: 20  Temp: 97.9 F (36.6 C)  Weight: 123 lb 11.2 oz (56.1 kg)  Height: 5\' 3"  (1.6 m)   Body mass index is 21.91 kg/m. Physical Exam  Constitutional: She appears well-developed and well-nourished.  HENT:  Head: Normocephalic and atraumatic.  Eyes: Conjunctivae and EOM are normal. Pupils are equal, round, and reactive to light. Right eye exhibits no discharge. Left eye exhibits no discharge.  Neck: Normal range of motion. Neck supple. No JVD present. No thyromegaly present.  Cardiovascular: Normal rate, regular rhythm and normal heart sounds.   No murmur heard. Pulmonary/Chest: Effort normal. She has no wheezes. She has rales (bibasilar dry).  Abdominal: Soft.  Bowel sounds are normal. There is no tenderness.  Musculoskeletal: Normal range of motion. She exhibits no edema or tenderness.  Neurological: She is alert. No cranial nerve deficit. She exhibits normal muscle tone. Coordination normal.  Skin: Skin is warm and dry. No rash noted.  On and off dry skin itching.   Psychiatric: Her mood appears anxious. Her affect is labile. Her affect is not angry. Her speech is tangential. Her speech is not slurred. She is agitated (when assisted with perinenal care) and combative (when she doesn't understand what you want her to do). She is not aggressive, not hyperactive and not withdrawn. Thought content is not paranoid and not delusional. Cognition and memory are impaired. She expresses impulsivity and inappropriate judgment. She does not exhibit a depressed mood. She exhibits abnormal recent memory and abnormal remote memory.    Labs reviewed:  Recent Labs  03/31/16  NA 139  K 4.5  BUN 31*  CREATININE 0.8    Recent Labs  03/31/16  AST 15  ALT 13  ALKPHOS  98    Recent Labs  03/31/16  WBC 7.3  HGB 12.9  HCT 38  PLT 349   Lab Results  Component Value Date   TSH 0.47 03/31/2016   No results found for: HGBA1C No results found for: CHOL, HDL, LDLCALC, LDLDIRECT, TRIG, CHOLHDL  Significant Diagnostic Results in last 30 days:  No results found.  Assessment/Plan 1. Alzheimer's dementia without behavioral disturbance, unspecified timing of dementia onset gradual deterioration. No hope of improvement  2. Essential hypertension Stop lisinopril.   3. FTT (failure to thrive) in adult Related to dementia

## 2017-02-23 NOTE — Addendum Note (Signed)
Addended by: Estill Dooms on: 02/23/2017 05:47 PM   Modules accepted: Level of Service

## 2017-03-22 ENCOUNTER — Encounter: Payer: Self-pay | Admitting: Nurse Practitioner

## 2017-03-22 ENCOUNTER — Non-Acute Institutional Stay (SKILLED_NURSING_FACILITY): Payer: Medicare Other | Admitting: Nurse Practitioner

## 2017-03-22 DIAGNOSIS — K2270 Barrett's esophagus without dysplasia: Secondary | ICD-10-CM | POA: Diagnosis not present

## 2017-03-22 DIAGNOSIS — F0280 Dementia in other diseases classified elsewhere without behavioral disturbance: Secondary | ICD-10-CM

## 2017-03-22 DIAGNOSIS — F411 Generalized anxiety disorder: Secondary | ICD-10-CM | POA: Diagnosis not present

## 2017-03-22 DIAGNOSIS — K5900 Constipation, unspecified: Secondary | ICD-10-CM | POA: Diagnosis not present

## 2017-03-22 DIAGNOSIS — G309 Alzheimer's disease, unspecified: Secondary | ICD-10-CM | POA: Diagnosis not present

## 2017-03-22 DIAGNOSIS — I10 Essential (primary) hypertension: Secondary | ICD-10-CM | POA: Diagnosis not present

## 2017-03-22 DIAGNOSIS — R627 Adult failure to thrive: Secondary | ICD-10-CM

## 2017-03-22 NOTE — Assessment & Plan Note (Signed)
Asymptomatic, off Omeprazole 20mg  daily.

## 2017-03-22 NOTE — Assessment & Plan Note (Signed)
Persists, continue supportive care 

## 2017-03-22 NOTE — Assessment & Plan Note (Signed)
Failed off Celexa. Presently taking Celexa 10mg  daily

## 2017-03-22 NOTE — Assessment & Plan Note (Signed)
End stage, off memory preserving meds, total care of ADLs. Update CBC CMP TSH

## 2017-03-22 NOTE — Progress Notes (Signed)
Location:  [place] Room Number: 16 Place of Service:  SNF (31) Provider:  Arlee Santosuosso, Manxie  NP  Jeanmarie Hubert, MD  Patient Care Team: Estill Dooms, MD as PCP - General (Internal Medicine) Fraser Eudelia Hiltunen Otho Darner, NP as Nurse Practitioner (Internal Medicine)  Extended Emergency Contact Information Primary Emergency Contact: [facility] Address: [address]          [place] Phone: [phone] Relation: None  Code Status:  DNR Goals of care: Advanced Directive information Advanced Directives 03/22/2017  Does Patient Have a Medical Advance Directive? Yes  Type of Advance Directive Living will;Out of facility DNR (pink MOST or yellow form)  Does patient want to make changes to medical advance directive? No - Patient declined  Copy of [place] in Chart? -  Pre-existing out of facility DNR order (yellow form or pink MOST form) -     Chief Complaint  Patient presents with  . Medical Management of Chronic Issues    HPI:  Pt is a 83 y.o. female seen today for medical management of chronic diseases.      Hx of Alzheimer's dementia, end stage, total care, off memory preserving meds. Mood has been stabilized while on Celexa 10mg . Regular BM while on MiraLax daily. Blood pressure is controlled, off Lisinopril 10mg  daily. Asymptomatic of GERD. Multiple site osteoarthritis is chronic, Tylenol help to ease aches and pains. Past Medical History:  Diagnosis Date  . Altered mental status 02/21/2013  . Altered mental status 04/11/2010  . Alzheimer's disease   . Anemia, unspecified 04/05/2009  . Anxiety state, unspecified   . Aortic aneurysm of unspecified site without mention of rupture 07/28/2005  . Barrett's esophagus   . Chronic airway obstruction, not elsewhere classified   . Closed fracture of distal end of ulna (alone) 04/12  . Disorders of bursae and tendons in shoulder region, unspecified 06/21/2009  .  Flatulence, eructation, and gas pain   . Hemorrhage of gastrointestinal tract, unspecified 02/21/2013  . Hip joint replacement by other means 03/23/2009  . Hyperglycemia   . Hypotension, unspecified 02/21/2013  . Joint pain of lower extremity 04/15/2012  . Malignant neoplasm of colon, unspecified site   . Osteoarthrosis, unspecified whether generalized or localized, unspecified site   . Osteoarthrosis, unspecified whether generalized or localized, unspecified site 07/28/2005  . Osteoporosis, unspecified   . Osteoporosis, unspecified 07/28/2005  . Other emphysema ([place]) 07/29/1999  . Pain in joint, pelvic region and thigh 04/12/2009  . Pneumonia, organism unspecified(486) 02/24/2013  . Pressure ulcer, heel(707.07)   . Raynaud's syndrome 07/28/1998  . Shortness of breath   . Unspecified constipation 10/09/2008  . Unspecified essential hypertension   . Unspecified fall 06/13/2012  . Unspecified vitamin D deficiency 05/07/2010  . Urinary frequency    Past Surgical History:  Procedure Laterality Date  . CATARACT EXTRACTION W/ INTRAOCULAR LENS  IMPLANT, BILATERAL  2004  . COLON SURGERY  2001   partial colon resecton for cancer  . DILATION AND CURETTAGE OF UTERUS    . TOTAL HIP ARTHROPLASTY Left 2010    Allergies  Allergen Reactions  . Morphine And Related     Per MAR  . Oxytrol [Oxybutynin]     Per MAR    Allergies as of 03/22/2017      Reactions   Morphine And Related    Per MAR   Oxytrol [oxybutynin]    Per [facility]      Medication List  Accurate as of 03/22/17  4:08 PM. Always use your most recent med list.          acetaminophen 325 MG tablet Commonly known as:  TYLENOL Take 650 mg by mouth 2 (two) times daily. *Also takes 650mg  every 6 hours as needed*   citalopram 10 MG tablet Commonly known as:  CELEXA Take 10 mg by mouth. Take 1/2 tablet daily   guaifenesin 100 MG/5ML syrup Commonly known as:  ROBITUSSIN Take 10 mLs by mouth every 6 (six) hours as  needed for cough.   lactose free nutrition Liqd Take 237 mLs by mouth. Drink one can every day at 6 pm   polyethylene glycol packet Commonly known as:  MIRALAX / GLYCOLAX Take 17 g by mouth daily.   polyvinyl alcohol 1.4 % ophthalmic solution Commonly known as:  LIQUIFILM TEARS Place 1 drop into both eyes 3 (three) times daily.   Vitamin D3 50000 units Caps Take 1 capsule by mouth every 30 (thirty) days. On the 19th of the month.       Review of Systems  Constitutional: Negative for chills and fever.  HENT: Positive for hearing loss. Negative for congestion and ear discharge.   Eyes: Negative for pain and redness.  Respiratory: Negative for cough, shortness of breath and wheezing.   Cardiovascular: Negative for chest pain, palpitations and leg swelling.  Gastrointestinal: Negative for abdominal pain, constipation, diarrhea, nausea and vomiting.  Genitourinary: Positive for frequency. Negative for dysuria and urgency.       Incontinent of bladder  Musculoskeletal: Positive for back pain. Negative for myalgias and neck pain.  Skin: Negative for rash.       Dry skin .  Neurological: Negative for dizziness, tremors, seizures, weakness and headaches.  Psychiatric/Behavioral: Negative for hallucinations and suicidal ideas. The patient is nervous/anxious.     Immunization History  Administered Date(s) Administered  . Influenza Whole 10/24/2012  . Influenza-Unspecified 10/31/2014, 09/18/2015, 10/13/2016  . Pneumococcal Polysaccharide-23 12/29/2003  . Td 12/28/2006  . Tdap 03/13/2014   Pertinent  Health Maintenance Due  Topic Date Due  . DEXA SCAN  02/09/1984  . PNA vac Low Risk Adult (2 of 2 - PCV13) 12/28/2004  . INFLUENZA VACCINE  Completed   Fall Risk  05/02/2015  Falls in the past year? No  Risk for fall due to : Impaired balance/gait;Impaired mobility   Functional Status Survey:    Vitals:   03/22/17 1527  BP: 100/76  Pulse: 62  Resp: 20  Temp: 98.6 F (37 C)    Weight: 122 lb (55.3 kg)  Height: 5\' 3"  (1.6 m)   Body mass index is 21.61 kg/m. Physical Exam  Constitutional: She appears well-developed and well-nourished.  HENT:  Head: Normocephalic and atraumatic.  Eyes: Conjunctivae and EOM are normal. Pupils are equal, round, and reactive to light. Right eye exhibits no discharge. Left eye exhibits no discharge.  Neck: Normal range of motion. Neck supple. No JVD present. No thyromegaly present.  Cardiovascular: Normal rate, regular rhythm and normal heart sounds.   No murmur heard. Pulmonary/Chest: Effort normal. She has no wheezes. She has rales (bibasilar dry).  Abdominal: Soft. Bowel sounds are normal. There is no tenderness.  Musculoskeletal: Normal range of motion. She exhibits no edema or tenderness.  Neurological: She is alert. No cranial nerve deficit. She exhibits normal muscle tone. Coordination normal.  Skin: Skin is warm and dry. No rash noted.  On and off dry skin itching.   Psychiatric: Her mood appears anxious.  Her affect is labile. Her affect is not angry. Her speech is tangential. Her speech is not slurred. She is agitated (when assisted with perinenal care) and combative (when she doesn't understand what you want her to do). She is not aggressive, not hyperactive and not withdrawn. Thought content is not paranoid and not delusional. Cognition and memory are impaired. She expresses impulsivity and inappropriate judgment. She does not exhibit a depressed mood. She exhibits abnormal recent memory and abnormal remote memory.    Labs reviewed:  Recent Labs  03/31/16  NA 139  K 4.5  BUN 31*  CREATININE 0.8    Recent Labs  03/31/16  AST 15  ALT 13  ALKPHOS 98    Recent Labs  03/31/16  WBC 7.3  HGB 12.9  HCT 38  PLT 349   Lab Results  Component Value Date   TSH 0.47 03/31/2016   No results found for: HGBA1C No results found for: CHOL, HDL, LDLCALC, LDLDIRECT, TRIG, CHOLHDL  Significant Diagnostic Results in  last 30 days:  No results found.  Assessment/Plan Essential hypertension Controlled, off Lisinopril 10mg     Constipation Stable, continue MiraLax daily   Barrett's esophagus Asymptomatic, off Omeprazole 20mg  daily.   Alzheimer disease End stage, off memory preserving meds, total care of ADLs. Update CBC CMP TSH  Anxiety state Failed off Celexa. Presently taking Celexa 10mg  daily  FTT (failure to thrive) in adult Persists, continue supportive care     Family/ staff Communication: continue SNF  Labs/tests ordered:  CBC CMP TSH

## 2017-03-22 NOTE — Assessment & Plan Note (Signed)
Stable, continue MiraLax daily.  

## 2017-03-22 NOTE — Assessment & Plan Note (Signed)
Controlled, off Lisinopril 10mg 

## 2017-03-23 LAB — BASIC METABOLIC PANEL
BUN: 27 mg/dL — AB (ref 4–21)
Creatinine: 0.8 mg/dL (ref <=1.1)
Glucose: 90 mg/dL
POTASSIUM: 4.9 mmol/L (ref 3.4–5.3)
SODIUM: 139 mmol/L (ref 137–147)

## 2017-03-23 LAB — TSH: TSH: 0.66 u[IU]/mL (ref <=5.90)

## 2017-03-23 LAB — HEPATIC FUNCTION PANEL
ALT: 10 U/L (ref 7–35)
AST: 14 U/L (ref 13–35)
Alkaline Phosphatase: 90 U/L (ref 25–125)
Bilirubin, Total: 0.5 mg/dL

## 2017-03-23 LAB — CBC AND DIFFERENTIAL
HEMATOCRIT: 39 % (ref 36–46)
HEMOGLOBIN: 13.6 g/dL (ref 12.0–16.0)
Platelets: 311 10*3/uL (ref 150–399)
WBC: 9.3 10^3/mL

## 2017-03-25 ENCOUNTER — Other Ambulatory Visit: Payer: Self-pay | Admitting: *Deleted

## 2017-04-22 ENCOUNTER — Non-Acute Institutional Stay (SKILLED_NURSING_FACILITY): Payer: Medicare Other | Admitting: Nurse Practitioner

## 2017-04-22 ENCOUNTER — Encounter: Payer: Self-pay | Admitting: Nurse Practitioner

## 2017-04-22 DIAGNOSIS — F0280 Dementia in other diseases classified elsewhere without behavioral disturbance: Secondary | ICD-10-CM

## 2017-04-22 DIAGNOSIS — K5900 Constipation, unspecified: Secondary | ICD-10-CM

## 2017-04-22 DIAGNOSIS — M150 Primary generalized (osteo)arthritis: Secondary | ICD-10-CM

## 2017-04-22 DIAGNOSIS — G309 Alzheimer's disease, unspecified: Secondary | ICD-10-CM | POA: Diagnosis not present

## 2017-04-22 DIAGNOSIS — K219 Gastro-esophageal reflux disease without esophagitis: Secondary | ICD-10-CM

## 2017-04-22 DIAGNOSIS — M159 Polyosteoarthritis, unspecified: Secondary | ICD-10-CM

## 2017-04-22 DIAGNOSIS — F411 Generalized anxiety disorder: Secondary | ICD-10-CM | POA: Diagnosis not present

## 2017-04-22 DIAGNOSIS — I10 Essential (primary) hypertension: Secondary | ICD-10-CM

## 2017-04-22 NOTE — Progress Notes (Signed)
Location:  [place] Room Number: 16 Place of Service:  SNF (31) Provider:  Mica Releford, Manxie  NP  Jeanmarie Hubert, MD  Patient Care Team: Estill Dooms, MD as PCP - General (Internal Medicine) Wolcott Kelcy Laible Otho Darner, NP as Nurse Practitioner (Internal Medicine)  Extended Emergency Contact Information Primary Emergency Contact: [facility] Address: [address]          [place] Home Phone: [phone] Relation: None  Code Status:  DNR Goals of care: Advanced Directive information Advanced Directives 04/22/2017  Does Patient Have a Medical Advance Directive? Yes  Type of Advance Directive Living will;Out of facility DNR (pink MOST or yellow form)  Does patient want to make changes to medical advance directive? No - Patient declined  Copy of [place] in Chart? -  Pre-existing out of facility DNR order (yellow form or pink MOST form) Yellow form placed in chart (order not valid for inpatient use)     Chief Complaint  Patient presents with  . Medical Management of Chronic Issues    HPI:  Pt is a 83 y.o. female seen today for medical management of chronic diseases.     Hx of Alzheimer's dementia, end stage, total care, off memory preserving meds. Mood has been stabilized while on Celexa 10mg . Regular BM while on MiraLax daily. Blood pressure is controlled, off Lisinopril 10mg  daily. Asymptomatic of GERD. Multiple site osteoarthritis is chronic, Tylenol help to ease aches and pains.   Past Medical History:  Diagnosis Date  . Altered mental status 02/21/2013  . Altered mental status 04/11/2010  . Alzheimer's disease   . Anemia, unspecified 04/05/2009  . Anxiety state, unspecified   . Aortic aneurysm of unspecified site without mention of rupture 07/28/2005  . Barrett's esophagus   . Chronic airway obstruction, not elsewhere classified   . Closed fracture of distal end of ulna (alone) 04/12  . Disorders of  bursae and tendons in shoulder region, unspecified 06/21/2009  . Flatulence, eructation, and gas pain   . Hemorrhage of gastrointestinal tract, unspecified 02/21/2013  . Hip joint replacement by other means 03/23/2009  . Hyperglycemia   . Hypotension, unspecified 02/21/2013  . Joint pain of lower extremity 04/15/2012  . Malignant neoplasm of colon, unspecified site   . Osteoarthrosis, unspecified whether generalized or localized, unspecified site   . Osteoarthrosis, unspecified whether generalized or localized, unspecified site 07/28/2005  . Osteoporosis, unspecified   . Osteoporosis, unspecified 07/28/2005  . Other emphysema ([place]) 07/29/1999  . Pain in joint, pelvic region and thigh 04/12/2009  . Pneumonia, organism unspecified(486) 02/24/2013  . Pressure ulcer, heel(707.07)   . Raynaud's syndrome 07/28/1998  . Shortness of breath   . Unspecified constipation 10/09/2008  . Unspecified essential hypertension   . Unspecified fall 06/13/2012  . Unspecified vitamin D deficiency 05/07/2010  . Urinary frequency    Past Surgical History:  Procedure Laterality Date  . CATARACT EXTRACTION W/ INTRAOCULAR LENS  IMPLANT, BILATERAL  2004  . COLON SURGERY  2001   partial colon resecton for cancer  . DILATION AND CURETTAGE OF UTERUS    . TOTAL HIP ARTHROPLASTY Left 2010    Allergies  Allergen Reactions  . Morphine And Related     Per MAR  . Oxytrol [Oxybutynin]     Per [facility]    Outpatient Encounter Prescriptions as of 04/22/2017  Medication Sig  . acetaminophen (TYLENOL) 325 MG tablet Take 650 mg by mouth 2 (two) times daily. *  Also takes 650mg  every 6 hours as needed*  . Cholecalciferol (VITAMIN D3) 50000 units CAPS Take 1 capsule by mouth every 30 (thirty) days. On the 19th of the month.  . citalopram (CELEXA) 10 MG tablet Take 10 mg by mouth. Take 1/2 tablet daily  . guaifenesin (ROBITUSSIN) 100 MG/5ML syrup Take 10 mLs by mouth every 6 (six) hours as needed for cough.  . lactose free  nutrition (BOOST) LIQD Take 237 mLs by mouth. Drink one can every day at 6 pm  . polyethylene glycol (MIRALAX / GLYCOLAX) packet Take 17 g by mouth daily.  . polyvinyl alcohol (LIQUIFILM TEARS) 1.4 % ophthalmic solution Place 1 drop into both eyes 3 (three) times daily.   No facility-administered encounter medications on file as of 04/22/2017.     Review of Systems  Constitutional: Negative for chills and fever.  HENT: Positive for hearing loss. Negative for congestion and ear discharge.   Eyes: Negative for pain and redness.  Respiratory: Negative for cough, shortness of breath and wheezing.   Cardiovascular: Negative for chest pain, palpitations and leg swelling.  Gastrointestinal: Negative for abdominal pain, constipation, diarrhea, nausea and vomiting.  Genitourinary: Positive for frequency. Negative for dysuria and urgency.       Incontinent of bladder  Musculoskeletal: Positive for back pain. Negative for myalgias and neck pain.  Skin: Negative for rash.       Dry skin .  Neurological: Negative for dizziness, tremors, seizures, weakness and headaches.  Psychiatric/Behavioral: Negative for hallucinations and suicidal ideas. The patient is nervous/anxious.     Immunization History  Administered Date(s) Administered  . Influenza Whole 10/24/2012  . Influenza-Unspecified 10/31/2014, 09/18/2015, 10/13/2016  . Pneumococcal Polysaccharide-23 12/29/2003  . Td 12/28/2006  . Tdap 03/13/2014   Pertinent  Health Maintenance Due  Topic Date Due  . DEXA SCAN  02/09/1984  . PNA vac Low Risk Adult (2 of 2 - PCV13) 12/28/2004  . INFLUENZA VACCINE  07/28/2017   Fall Risk  05/02/2015  Falls in the past year? No  Risk for fall due to : Impaired balance/gait;Impaired mobility   Functional Status Survey:    Vitals:   04/22/17 1317  BP: 120/62  Pulse: 70  Resp: 16  Temp: 98.2 F (36.8 C)  Weight: 122 lb 3.2 oz (55.4 kg)  Height: 5\' 3"  (1.6 m)   Body mass index is 21.65  kg/m. Physical Exam  Labs reviewed:  Recent Labs  03/23/17  NA 139  K 4.9  BUN 27*  CREATININE 0.8    Recent Labs  03/23/17  AST 14  ALT 10  ALKPHOS 90    Recent Labs  03/23/17  WBC 9.3  HGB 13.6  HCT 39  PLT 311   Lab Results  Component Value Date   TSH 0.66 03/23/2017   No results found for: HGBA1C No results found for: CHOL, HDL, LDLCALC, LDLDIRECT, TRIG, CHOLHDL  Significant Diagnostic Results in last 30 days:  No results found.  Assessment/Plan Constipation Stable, continue MiraLax daily  Essential hypertension Controlled, off Lisinopril 10mg , 03/23/17 Na 139, K 4.9, Bun 27, creat 0.8, wbc 9.3, Hgb 13.6, plt 311  GERD (gastroesophageal reflux disease) Asymptomatic, off acid reducer  Alzheimer disease End stage, off memory preserving meds, total care of ADLs, 03/23/17 Na 139, K 4.9, Bun 27, creat 0.8, wbc 9.3, Hgb 13.6, plt 311, TSH 0.66  Osteoarthritis Multiple sites, continueTylenol 650mg  bid and Prn  Anxiety state Failed off Celexa. Presently taking Celexa 10mg  daily  Family/ staff Communication: SNF  Labs/tests ordered:  none

## 2017-04-22 NOTE — Assessment & Plan Note (Signed)
Stable, continue MiraLax daily.  

## 2017-04-22 NOTE — Assessment & Plan Note (Addendum)
End stage, off memory preserving meds, total care of ADLs, 03/23/17 Na 139, K 4.9, Bun 27, creat 0.8, wbc 9.3, Hgb 13.6, plt 311, TSH 0.66

## 2017-04-22 NOTE — Assessment & Plan Note (Signed)
Failed off Celexa. Presently taking Celexa 10mg  daily

## 2017-04-22 NOTE — Assessment & Plan Note (Signed)
Multiple sites, continueTylenol 650mg  bid and Prn

## 2017-04-22 NOTE — Assessment & Plan Note (Addendum)
Controlled, off Lisinopril 10mg , 03/23/17 Na 139, K 4.9, Bun 27, creat 0.8, wbc 9.3, Hgb 13.6, plt 311

## 2017-04-22 NOTE — Assessment & Plan Note (Signed)
Asymptomatic, off acid reducer

## 2017-05-28 DEATH — deceased
